# Patient Record
Sex: Male | Born: 1945
Health system: Southern US, Community
[De-identification: ages and names within clinical notes are randomized; demographics above are authoritative.]

## PROBLEM LIST (undated history)

## (undated) DIAGNOSIS — B1920 Unspecified viral hepatitis C without hepatic coma: Secondary | ICD-10-CM

## (undated) DIAGNOSIS — I10 Essential (primary) hypertension: Secondary | ICD-10-CM

## (undated) DIAGNOSIS — I6529 Occlusion and stenosis of unspecified carotid artery: Secondary | ICD-10-CM

## (undated) DIAGNOSIS — J189 Pneumonia, unspecified organism: Secondary | ICD-10-CM

## (undated) DIAGNOSIS — E785 Hyperlipidemia, unspecified: Secondary | ICD-10-CM

## (undated) DIAGNOSIS — E876 Hypokalemia: Secondary | ICD-10-CM

## (undated) DIAGNOSIS — M722 Plantar fascial fibromatosis: Secondary | ICD-10-CM

## (undated) DIAGNOSIS — L439 Lichen planus, unspecified: Secondary | ICD-10-CM

## (undated) DIAGNOSIS — J479 Bronchiectasis, uncomplicated: Secondary | ICD-10-CM

## (undated) DIAGNOSIS — I2510 Atherosclerotic heart disease of native coronary artery without angina pectoris: Secondary | ICD-10-CM

## (undated) DIAGNOSIS — I471 Supraventricular tachycardia, unspecified: Secondary | ICD-10-CM

## (undated) DIAGNOSIS — R55 Syncope and collapse: Secondary | ICD-10-CM

## (undated) DIAGNOSIS — K219 Gastro-esophageal reflux disease without esophagitis: Secondary | ICD-10-CM

## (undated) DIAGNOSIS — Z8619 Personal history of other infectious and parasitic diseases: Secondary | ICD-10-CM

## (undated) DIAGNOSIS — I498 Other specified cardiac arrhythmias: Secondary | ICD-10-CM

## (undated) HISTORY — DX: Hyperlipidemia, unspecified: E78.5

## (undated) HISTORY — PX: CHOLECYSTECTOMY: SHX55

## (undated) HISTORY — DX: Occlusion and stenosis of unspecified carotid artery: I65.29

## (undated) HISTORY — PX: CORONARY ANGIOPLASTY WITH STENT PLACEMENT: SHX49

## (undated) HISTORY — DX: Syncope and collapse: R55

## (undated) HISTORY — DX: Plantar fascial fibromatosis: M72.2

## (undated) HISTORY — DX: Supraventricular tachycardia: I47.1

## (undated) HISTORY — DX: Other specified cardiac arrhythmias: I49.8

## (undated) HISTORY — DX: Personal history of other infectious and parasitic diseases: Z86.19

## (undated) HISTORY — PX: OTHER SURGICAL HISTORY: SHX169

## (undated) HISTORY — PX: KNEE ARTHROSCOPY: SUR90

## (undated) HISTORY — DX: Gastro-esophageal reflux disease without esophagitis: K21.9

## (undated) HISTORY — DX: Supraventricular tachycardia, unspecified: I47.10

## (undated) HISTORY — DX: Pneumonia, unspecified organism: J18.9

## (undated) HISTORY — PX: COLONOSCOPY: SHX174

## (undated) HISTORY — PX: ESOPHAGOGASTRODUODENOSCOPY: SHX1529

## (undated) HISTORY — PX: CATARACT EXTRACTION: SUR2

## (undated) HISTORY — DX: Lichen planus, unspecified: L43.9

## (undated) HISTORY — DX: Hypokalemia: E87.6

## (undated) HISTORY — DX: Atherosclerotic heart disease of native coronary artery without angina pectoris: I25.10

---

## 1999-09-23 ENCOUNTER — Encounter: Payer: Self-pay | Admitting: Gastroenterology

## 1999-09-23 ENCOUNTER — Ambulatory Visit (HOSPITAL_COMMUNITY): Admission: RE | Admit: 1999-09-23 | Discharge: 1999-09-23 | Payer: Self-pay | Admitting: Gastroenterology

## 1999-12-13 ENCOUNTER — Encounter: Payer: Self-pay | Admitting: General Surgery

## 1999-12-13 ENCOUNTER — Encounter (INDEPENDENT_AMBULATORY_CARE_PROVIDER_SITE_OTHER): Payer: Self-pay | Admitting: Specialist

## 1999-12-13 ENCOUNTER — Ambulatory Visit (HOSPITAL_COMMUNITY): Admission: RE | Admit: 1999-12-13 | Discharge: 1999-12-14 | Payer: Self-pay | Admitting: General Surgery

## 2000-11-10 ENCOUNTER — Ambulatory Visit (HOSPITAL_COMMUNITY): Admission: RE | Admit: 2000-11-10 | Discharge: 2000-11-10 | Payer: Self-pay | Admitting: Gastroenterology

## 2001-01-29 ENCOUNTER — Ambulatory Visit (HOSPITAL_COMMUNITY): Admission: RE | Admit: 2001-01-29 | Discharge: 2001-01-29 | Payer: Self-pay | Admitting: Gastroenterology

## 2001-01-29 ENCOUNTER — Encounter (INDEPENDENT_AMBULATORY_CARE_PROVIDER_SITE_OTHER): Payer: Self-pay | Admitting: Specialist

## 2001-05-04 ENCOUNTER — Ambulatory Visit (HOSPITAL_COMMUNITY): Admission: RE | Admit: 2001-05-04 | Discharge: 2001-05-04 | Payer: Self-pay | Admitting: Gastroenterology

## 2001-05-04 ENCOUNTER — Encounter: Payer: Self-pay | Admitting: Gastroenterology

## 2002-08-12 ENCOUNTER — Ambulatory Visit (HOSPITAL_BASED_OUTPATIENT_CLINIC_OR_DEPARTMENT_OTHER): Admission: RE | Admit: 2002-08-12 | Discharge: 2002-08-12 | Payer: Self-pay | Admitting: Urology

## 2002-08-12 ENCOUNTER — Encounter (INDEPENDENT_AMBULATORY_CARE_PROVIDER_SITE_OTHER): Payer: Self-pay | Admitting: *Deleted

## 2003-05-10 ENCOUNTER — Ambulatory Visit (HOSPITAL_COMMUNITY): Admission: RE | Admit: 2003-05-10 | Discharge: 2003-05-10 | Payer: Self-pay | Admitting: Orthopedic Surgery

## 2003-05-10 ENCOUNTER — Ambulatory Visit (HOSPITAL_BASED_OUTPATIENT_CLINIC_OR_DEPARTMENT_OTHER): Admission: RE | Admit: 2003-05-10 | Discharge: 2003-05-10 | Payer: Self-pay | Admitting: Orthopedic Surgery

## 2004-01-16 ENCOUNTER — Ambulatory Visit: Payer: Self-pay | Admitting: Gastroenterology

## 2004-01-29 ENCOUNTER — Ambulatory Visit (HOSPITAL_COMMUNITY): Admission: RE | Admit: 2004-01-29 | Discharge: 2004-01-29 | Payer: Self-pay | Admitting: Gastroenterology

## 2004-09-10 ENCOUNTER — Ambulatory Visit: Payer: Self-pay | Admitting: Gastroenterology

## 2005-01-27 ENCOUNTER — Encounter: Admission: RE | Admit: 2005-01-27 | Discharge: 2005-01-27 | Payer: Self-pay | Admitting: Family Medicine

## 2005-01-30 ENCOUNTER — Ambulatory Visit: Payer: Self-pay | Admitting: Gastroenterology

## 2005-02-06 ENCOUNTER — Ambulatory Visit: Payer: Self-pay | Admitting: Gastroenterology

## 2005-02-27 ENCOUNTER — Ambulatory Visit: Payer: Self-pay | Admitting: Gastroenterology

## 2005-03-06 ENCOUNTER — Ambulatory Visit: Payer: Self-pay | Admitting: Gastroenterology

## 2005-03-20 ENCOUNTER — Ambulatory Visit: Payer: Self-pay | Admitting: Gastroenterology

## 2005-04-03 ENCOUNTER — Ambulatory Visit: Payer: Self-pay | Admitting: Gastroenterology

## 2005-04-17 ENCOUNTER — Ambulatory Visit: Payer: Self-pay | Admitting: Gastroenterology

## 2005-04-21 ENCOUNTER — Ambulatory Visit (HOSPITAL_COMMUNITY): Admission: RE | Admit: 2005-04-21 | Discharge: 2005-04-21 | Payer: Self-pay | Admitting: Gastroenterology

## 2005-05-15 ENCOUNTER — Ambulatory Visit: Payer: Self-pay | Admitting: Gastroenterology

## 2005-06-12 ENCOUNTER — Ambulatory Visit: Payer: Self-pay | Admitting: Gastroenterology

## 2005-06-13 ENCOUNTER — Encounter: Admission: RE | Admit: 2005-06-13 | Discharge: 2005-06-13 | Payer: Self-pay | Admitting: Family Medicine

## 2005-07-10 ENCOUNTER — Ambulatory Visit: Payer: Self-pay | Admitting: Gastroenterology

## 2005-07-28 ENCOUNTER — Ambulatory Visit: Payer: Self-pay | Admitting: Emergency Medicine

## 2005-07-31 ENCOUNTER — Ambulatory Visit: Admission: RE | Admit: 2005-07-31 | Discharge: 2005-07-31 | Payer: Self-pay | Admitting: Emergency Medicine

## 2005-07-31 ENCOUNTER — Ambulatory Visit: Payer: Self-pay | Admitting: Internal Medicine

## 2005-08-07 ENCOUNTER — Ambulatory Visit: Payer: Self-pay | Admitting: Gastroenterology

## 2005-08-18 ENCOUNTER — Ambulatory Visit: Payer: Self-pay | Admitting: Emergency Medicine

## 2005-11-27 ENCOUNTER — Ambulatory Visit: Payer: Self-pay | Admitting: Gastroenterology

## 2006-02-13 ENCOUNTER — Ambulatory Visit: Payer: Self-pay | Admitting: Cardiovascular Disease

## 2006-06-25 ENCOUNTER — Emergency Department (HOSPITAL_COMMUNITY): Admission: EM | Admit: 2006-06-25 | Discharge: 2006-06-25 | Payer: Self-pay | Admitting: Emergency Medicine

## 2006-12-28 ENCOUNTER — Ambulatory Visit (HOSPITAL_BASED_OUTPATIENT_CLINIC_OR_DEPARTMENT_OTHER): Admission: RE | Admit: 2006-12-28 | Discharge: 2006-12-28 | Payer: Self-pay | Admitting: Emergency Medicine

## 2007-01-03 ENCOUNTER — Ambulatory Visit: Payer: Self-pay | Admitting: Internal Medicine

## 2007-04-20 ENCOUNTER — Ambulatory Visit: Payer: Self-pay | Admitting: Gastroenterology

## 2007-10-21 ENCOUNTER — Encounter: Admission: RE | Admit: 2007-10-21 | Discharge: 2007-10-21 | Payer: Self-pay | Admitting: Gastroenterology

## 2007-12-28 ENCOUNTER — Ambulatory Visit: Payer: Self-pay | Admitting: Gastroenterology

## 2008-05-02 ENCOUNTER — Encounter: Admission: RE | Admit: 2008-05-02 | Discharge: 2008-05-02 | Payer: Self-pay | Admitting: Family Medicine

## 2008-06-23 ENCOUNTER — Ambulatory Visit: Payer: Self-pay | Admitting: Internal Medicine

## 2008-06-23 ENCOUNTER — Inpatient Hospital Stay (HOSPITAL_COMMUNITY): Admission: AD | Admit: 2008-06-23 | Discharge: 2008-06-25 | Payer: Self-pay | Admitting: Internal Medicine

## 2008-06-23 ENCOUNTER — Encounter: Payer: Self-pay | Admitting: Emergency Medicine

## 2008-06-23 ENCOUNTER — Ambulatory Visit: Payer: Self-pay | Admitting: Diagnostic Radiology

## 2008-06-30 DIAGNOSIS — I10 Essential (primary) hypertension: Secondary | ICD-10-CM | POA: Insufficient documentation

## 2008-06-30 DIAGNOSIS — I498 Other specified cardiac arrhythmias: Secondary | ICD-10-CM | POA: Insufficient documentation

## 2008-06-30 DIAGNOSIS — I2510 Atherosclerotic heart disease of native coronary artery without angina pectoris: Secondary | ICD-10-CM | POA: Insufficient documentation

## 2008-06-30 DIAGNOSIS — M722 Plantar fascial fibromatosis: Secondary | ICD-10-CM | POA: Insufficient documentation

## 2008-06-30 DIAGNOSIS — E785 Hyperlipidemia, unspecified: Secondary | ICD-10-CM | POA: Insufficient documentation

## 2008-06-30 DIAGNOSIS — I471 Supraventricular tachycardia, unspecified: Secondary | ICD-10-CM | POA: Insufficient documentation

## 2008-06-30 DIAGNOSIS — E876 Hypokalemia: Secondary | ICD-10-CM | POA: Insufficient documentation

## 2008-06-30 DIAGNOSIS — L439 Lichen planus, unspecified: Secondary | ICD-10-CM | POA: Insufficient documentation

## 2008-06-30 DIAGNOSIS — R55 Syncope and collapse: Secondary | ICD-10-CM | POA: Insufficient documentation

## 2008-06-30 DIAGNOSIS — Z8619 Personal history of other infectious and parasitic diseases: Secondary | ICD-10-CM | POA: Insufficient documentation

## 2008-07-05 ENCOUNTER — Telehealth (INDEPENDENT_AMBULATORY_CARE_PROVIDER_SITE_OTHER): Payer: Self-pay | Admitting: *Deleted

## 2008-07-05 ENCOUNTER — Ambulatory Visit: Payer: Self-pay | Admitting: Cardiovascular Disease

## 2008-07-10 ENCOUNTER — Ambulatory Visit: Payer: Self-pay | Admitting: Cardiovascular Disease

## 2008-07-10 LAB — CONVERTED CEMR LAB
BUN: 16 mg/dL (ref 6–23)
CO2: 27 meq/L (ref 19–32)
Calcium: 9.3 mg/dL (ref 8.4–10.5)
Chloride: 108 meq/L (ref 96–112)
Creatinine, Ser: 0.8 mg/dL (ref 0.4–1.5)
GFR calc non Af Amer: 104.41 mL/min (ref >60)
Glucose, Bld: 98 mg/dL (ref 70–99)
Potassium: 3.9 meq/L (ref 3.5–5.1)
Sodium: 139 meq/L (ref 135–145)

## 2008-08-11 ENCOUNTER — Ambulatory Visit: Payer: Self-pay | Admitting: Cardiovascular Disease

## 2008-08-11 LAB — CONVERTED CEMR LAB
ALT: 33 units/L (ref 0–53)
AST: 25 units/L (ref 0–37)
Alkaline Phosphatase: 41 units/L (ref 39–117)
Cholesterol: 94 mg/dL (ref 0–200)
LDL Cholesterol: 54 mg/dL (ref 0–99)
Total Protein: 6.2 g/dL (ref 6.0–8.3)
Triglycerides: 62 mg/dL (ref 0.0–149.0)

## 2008-08-14 ENCOUNTER — Encounter: Payer: Self-pay | Admitting: Cardiovascular Disease

## 2008-10-12 ENCOUNTER — Ambulatory Visit (HOSPITAL_COMMUNITY): Admission: RE | Admit: 2008-10-12 | Discharge: 2008-10-12 | Payer: Self-pay | Admitting: Gastroenterology

## 2008-10-17 ENCOUNTER — Telehealth: Payer: Self-pay | Admitting: Cardiovascular Disease

## 2008-10-17 ENCOUNTER — Encounter: Payer: Self-pay | Admitting: Cardiovascular Disease

## 2008-10-20 ENCOUNTER — Telehealth: Payer: Self-pay | Admitting: Cardiovascular Disease

## 2009-01-17 ENCOUNTER — Ambulatory Visit: Payer: Self-pay | Admitting: Cardiovascular Disease

## 2009-01-24 ENCOUNTER — Telehealth: Payer: Self-pay | Admitting: Cardiovascular Disease

## 2009-03-02 ENCOUNTER — Telehealth: Payer: Self-pay | Admitting: Cardiovascular Disease

## 2009-06-19 ENCOUNTER — Telehealth (INDEPENDENT_AMBULATORY_CARE_PROVIDER_SITE_OTHER): Payer: Self-pay | Admitting: *Deleted

## 2009-07-24 ENCOUNTER — Ambulatory Visit: Payer: Self-pay | Admitting: Cardiovascular Disease

## 2009-07-24 DIAGNOSIS — I2510 Atherosclerotic heart disease of native coronary artery without angina pectoris: Secondary | ICD-10-CM | POA: Insufficient documentation

## 2009-07-26 ENCOUNTER — Encounter: Payer: Self-pay | Admitting: Cardiovascular Disease

## 2009-08-09 ENCOUNTER — Encounter: Admission: RE | Admit: 2009-08-09 | Discharge: 2009-08-09 | Payer: Self-pay | Admitting: Gastroenterology

## 2010-03-24 LAB — CONVERTED CEMR LAB
Albumin: 4.2 g/dL (ref 3.5–5.2)
Alkaline Phosphatase: 42 units/L (ref 39–117)
Bilirubin, Direct: 0 mg/dL (ref 0.0–0.3)
Total Bilirubin: 1.1 mg/dL (ref 0.3–1.2)
Total Protein: 7 g/dL (ref 6.0–8.3)

## 2010-03-26 NOTE — Progress Notes (Signed)
Summary: REFILL  Phone Note Refill Request Message from:  Patient on June 19, 2009 11:41 AM  Refills Requested: Medication #1:  CRESTOR 40 MG TABS 1 tab once daily  Medication #2:  PLAVIX 75 MG TABS 1 tab once daily SEND TO [facility]  Initial call taken by: Judie Grieve,  June 19, 2009 11:42 AM  Follow-up for Phone Call        Rx faxed to pharmacy Follow-up by: Oswald Hillock,  June 19, 2009 1:45 PM    Prescriptions: CRESTOR 40 MG TABS (ROSUVASTATIN CALCIUM) 1 tab once daily  #90 x 3   Entered by:   Oswald Hillock   Authorized by:   Verne Carrow, MD   Signed by:   Oswald Hillock on 06/19/2009   Method used:   Faxed to ...       [facility]* (mail-order)             ,          Ph: [phone]       Fax: [phone]   RxID:   [id number] PLAVIX 75 MG TABS (CLOPIDOGREL BISULFATE) 1 tab once daily  #90 x 3   Entered by:   Oswald Hillock   Authorized by:   Verne Carrow, MD   Signed by:   Oswald Hillock on 06/19/2009   Method used:   Faxed to ...       [facility]* (mail-order)             ,          Ph: [phone]       Fax: [phone]   RxID:   [id number]

## 2010-03-26 NOTE — Assessment & Plan Note (Signed)
Summary: 6 month rov/sl   Visit Type:  6 mo f/u Primary Provider:  Dr. [name]  CC:  no cardiac complaints today.  History of Present Illness: 65 yo WM with history of CAD s/p placement Xience DES mid LAD 06/23/08,  HTN, hyperlipidemia, former tobacco abuse and hepatitis C here for scheduled follow up.  He denies any chest pain, SOB, DOE, palpitations, near syncope, syncope, dizziness, orthopnea or PND, LE edema. He has been working full time without problems. He has been taking all of his medications as prescribed.  His recent labwork is reviewed from 06/21/09. AST 99 and ALT 186. Total chol: 95. HDL: 28 LDL:56 TG: 53.  He has known hepatitis C and has abnormal LFTs in the past. We have discussed statin therapy in the past and started the Crestor last fall when his LFTs were normal. Now that they are abnormal, he will need to discuss with his GI doctor.  He is getting in to see GI soon to discuss colonoscopy.   Current Medications (verified): 1)  Diovan 160 Mg Tabs (Valsartan) .Marland Kitchen.. 1 Tab Once Daily 2)  Budeprion Sr 150 Mg Xr12h-Tab (Bupropion Hcl) .Marland Kitchen.. 1 Tab Once Daily 3)  Fish Oil 1000 Mg Caps (Omega-3 Fatty Acids) .... 2 Caps Once Daily 4)  Calcium 600/vitamin D 600-400 Mg-Unit Tabs (Calcium Carbonate-Vitamin D) .... Take 1 Tablet By Mouth Two Times A Day 5)  Vitamin D3 2000 Unit Caps (Cholecalciferol) .Marland Kitchen.. 1cap Once Daily 6)  Vitamin B-12 250 Mcg Tabs (Cyanocobalamin) .Marland Kitchen.. 1 Tab Once Daily 7)  Vitamin C 500 Mg Tabs (Ascorbic Acid) .Marland Kitchen.. 1 Tab Once Daily 8)  Plavix 75 Mg Tabs (Clopidogrel Bisulfate) .Marland Kitchen.. 1 Tab Once Daily 9)  Crestor 40 Mg Tabs (Rosuvastatin Calcium) .Marland Kitchen.. 1 Tab Once Daily 10)  Cialis 20 Mg Tabs (Tadalafil) .... Use As Directed As Needed 11)  Tylenol Pm Extra Strength 500-25 Mg Tabs (Diphenhydramine-Apap (Sleep)) .... Use As Directed As Needed 12)  Aspirin 81 Mg Tbec (Aspirin) .... Take One Tablet By Mouth Daily 13)  Ranitidine Hcl 150 Mg Tabs (Ranitidine Hcl) .... As  Needed 14)  Zinc 50 Mg Tabs (Zinc) .... Take 1 Tablet By Mouth Once A Day  Allergies (verified): No Known Drug Allergies  Past History:  Past Medical History: CAD (ICD-414.00)(status post cardiac cath with percutaneous coronary intervention using XIENCE drug-eluting stent to mid left anterior descending 06/23/08).   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 Full Time, RN. Married  Tobacco Use - Former...greater than 40 pack per year... quit 1989 Alcohol Use - yes.quit 25 yrs ago Drug Use - yes..quit 25 yrs ago  Review of Systems  The patient denies fatigue, malaise, fever, weight gain/loss, vision loss, decreased hearing, hoarseness, chest pain, palpitations, shortness of breath, prolonged cough, wheezing, sleep apnea, coughing up blood, abdominal pain, blood in stool, nausea, vomiting, diarrhea, heartburn, incontinence, blood in urine, muscle weakness, joint pain, leg swelling, rash, skin lesions, headache, fainting, dizziness, depression, anxiety, enlarged lymph nodes, easy bruising or bleeding, and environmental allergies.    Vital Signs:  Patient profile:   65 year old male Height:      70 inches Weight:      204 pounds BMI:     29.38 Pulse rate:   68 / minute Pulse rhythm:  regular BP sitting:   114 / 70  (left arm) Cuff size:   large  Vitals Entered By: Danielle Rankin, CMA (Jul 24, 2009 10:37 AM)  Physical Exam  General:  General: Well developed, well nourished, NAD HEENT: OP clear, mucus membranes moist Psychiatric: Mood and affect normal Neck: No JVD, no carotid bruits, no thyromegaly, no lymphadenopathy. Lungs:Clear bilaterally, no  wheezes, rhonci, crackles CV: RRR no murmurs, gallops rubs Abdomen: soft, NT, ND, BS present Extremities: No edema, pulses 2+.    Impression & Recommendations:  Problem # 1:  CAD, NATIVE VESSEL (ICD-414.01) Stable s/p drug eluting stent mid LAD 13 months ago. Will stop Plavix since he has had one year of dual antiplatelet therapy in setting of uncomplicated PCI of mid LAD. Will increase ASA to 325 mg once daily. Continue statin for now. He will discuss recent abnormal LFTs with his GI doctor. We may need to cut his Crestor down to 10 mg per day and then see how his LFTs respond. Difficult so say if his LFTs are abnormal because of the statin or the underlying hepatitis. Will refill omeprazole.   The following medications were removed from the medication list:    Plavix 75 Mg Tabs (Clopidogrel bisulfate) .Marland Kitchen... 1 tab once daily His updated medication list for this problem includes:    Aspirin Ec 325 Mg Tbec (Aspirin) .Marland Kitchen... Take one tablet by mouth daily  Problem # 2:  HYPERLIPIDEMIA-MIXED (ICD-272.4) See above. Recent fasting lipids in HPI.   His updated medication list for this problem includes:    Crestor 40 Mg Tabs (Rosuvastatin calcium) .Marland Kitchen... 1 tab once daily  Patient Instructions: 1)  Your physician recommends that you schedule a follow-up appointment in: 6 months 2)  Your physician has recommended you make the following change in your medication: Stop Plavix. 3)  Increase coated aspirin to 325 mg by mouth daily Prescriptions: OMEPRAZOLE 20 MG CPDR (OMEPRAZOLE) take one tablet daily  #90 x 3   Entered by:   Dossie Arbour, RN, BSN   Authorized by:   Verne Carrow, MD   Signed by:   Dossie Arbour, RN, BSN on 07/24/2009   Method used:   Electronically to        [facility]* (mail-order)             ,          Ph: [phone]       Fax: [phone]   RxID:   [id number]

## 2010-03-26 NOTE — Progress Notes (Signed)
Summary: off plavix 5 days prior  Phone Note From Other Clinic   Caller: [facility] [phone] Request: Talk with Nurse Details for Reason: pt having surgery on 2/10. need to come off plavix 5 day prior. pls fax info [phone].  Initial call taken by: Lorne Skeens,  March 02, 2009 11:32 AM  Follow-up for Phone Call        Spoke with Rene Kocher at [facility] medical. Pt needs to be off Plavix for 5 days prior to endoscopy and colonoscopy which is planned for 2/10. Procedure is a routine follow up.  Will forward information to Dr. [name]. Follow-up by: Dossie Arbour, RN, BSN,  March 02, 2009 1:45 PM  Additional Follow-up for Phone Call Additional follow up Details #1::        Can we please wait until Summer to perform the colonscopy as it is routine. He needs one year of Plavix before it is held per current guidelines. The risk  of stent thrombosis is elevated within the first year after drug eluting stent placement. cdm Additional Follow-up by: Verne Carrow, MD,  March 05, 2009 11:20 AM    Additional Follow-up for Phone Call Additional follow up Details #2::    Rene Kocher at [facility] notified of Dr. Gibson Ramp recommendation as above Follow-up by: Dossie Arbour, RN, BSN,  March 05, 2009 11:37 AM

## 2010-06-01 LAB — CREATININE, SERUM: GFR calc non Af Amer: >60 mL/min (ref >60)

## 2010-06-04 LAB — BASIC METABOLIC PANEL
CO2: 26 mEq/L (ref 19–32)
Chloride: 107 mEq/L (ref 96–112)
Chloride: 109 mEq/L (ref 96–112)
Creatinine, Ser: 0.77 mg/dL (ref 0.4–1.5)
GFR calc Af Amer: >60 mL/min (ref >60)
GFR calc Af Amer: >60 mL/min (ref >60)
GFR calc non Af Amer: >60 mL/min (ref >60)
Glucose, Bld: 104 mg/dL — ABNORMAL HIGH (ref 70–99)
Glucose, Bld: 119 mg/dL — ABNORMAL HIGH (ref 70–99)
Potassium: 4 mEq/L (ref 3.5–5.1)
Sodium: 135 mEq/L (ref 135–145)

## 2010-06-04 LAB — CBC
HCT: 34.1 % — ABNORMAL LOW (ref 39.0–52.0)
HCT: 35 % — ABNORMAL LOW (ref 39.0–52.0)
Hemoglobin: 12.3 g/dL — ABNORMAL LOW (ref 13.0–17.0)
MCHC: 35.2 g/dL (ref 30.0–36.0)
MCV: 98.5 fL (ref 78.0–100.0)
MCV: 99.1 fL (ref 78.0–100.0)
Platelets: 187 10*3/uL (ref 150–400)

## 2010-06-04 LAB — HEPATIC FUNCTION PANEL
ALT: 30 U/L (ref 0–53)
AST: 21 U/L (ref 0–37)
Albumin: 3 g/dL — ABNORMAL LOW (ref 3.5–5.2)
Indirect Bilirubin: 0.7 mg/dL (ref 0.3–0.9)

## 2010-06-05 LAB — BASIC METABOLIC PANEL
Chloride: 109 mEq/L (ref 96–112)
GFR calc Af Amer: >60 mL/min (ref >60)
GFR calc non Af Amer: >60 mL/min (ref >60)

## 2010-06-05 LAB — URINALYSIS, ROUTINE W REFLEX MICROSCOPIC
Bilirubin Urine: NEGATIVE
Glucose, UA: NEGATIVE mg/dL
Hgb urine dipstick: NEGATIVE
Nitrite: NEGATIVE
Specific Gravity, Urine: 1.02 (ref 1.005–1.030)
pH: 6 (ref 5.0–8.0)

## 2010-06-05 LAB — COMPREHENSIVE METABOLIC PANEL
ALT: 40 U/L (ref 0–53)
AST: 41 U/L — ABNORMAL HIGH (ref 0–37)
Alkaline Phosphatase: 52 U/L (ref 39–117)
Creatinine, Ser: 0.8 mg/dL (ref 0.4–1.5)
GFR calc Af Amer: >60 mL/min (ref >60)
Potassium: 4.2 mEq/L (ref 3.5–5.1)
Sodium: 138 mEq/L (ref 135–145)

## 2010-06-05 LAB — CBC
HCT: 45.9 % (ref 39.0–52.0)
Hemoglobin: 12.3 g/dL — ABNORMAL LOW (ref 13.0–17.0)
MCHC: 34.8 g/dL (ref 30.0–36.0)
MCV: 99.1 fL (ref 78.0–100.0)
MCV: 99.3 fL (ref 78.0–100.0)
Platelets: 192 10*3/uL (ref 150–400)
Platelets: 274 10*3/uL (ref 150–400)
RBC: 3.58 MIL/uL — ABNORMAL LOW (ref 4.22–5.81)
RDW: 13 % (ref 11.5–15.5)
RDW: 12.2 % (ref 11.5–15.5)

## 2010-06-05 LAB — PROTIME-INR
Prothrombin Time: 17.9 seconds — ABNORMAL HIGH (ref 11.6–15.2)
Prothrombin Time: 15 seconds (ref 11.6–15.2)

## 2010-06-05 LAB — DIFFERENTIAL
Basophils Relative: 1 % (ref 0–1)
Monocytes Absolute: 0.5 10*3/uL (ref 0.1–1.0)
Neutrophils Relative %: 73 % (ref 43–77)

## 2010-06-05 LAB — POCT CARDIAC MARKERS
CKMB, poc: 1.2 ng/mL (ref 1.0–8.0)
Myoglobin, poc: 50.8 ng/mL (ref 12–200)
Myoglobin, poc: 53 ng/mL (ref 12–200)

## 2010-07-09 NOTE — Cardiovascular Report (Signed)
NAME:  XXAVIER, NOON NO.:  [id number]   MEDICAL RECORD NO.:  [id number]          PATIENT TYPE:  INP   LOCATION:  2503                         FACILITY:  [facility]   PHYSICIAN:  Bevelyn Buckles. Bensimhon, MDDATE OF BIRTH:  1946-01-15   DATE OF PROCEDURE:  06/23/2008  DATE OF DISCHARGE:                            CARDIAC CATHETERIZATION   PATIENT IDENTIFICATION:  Mr. Lazarz is a 65 year old male who works at  [employer].  He has no history of prior  coronary artery disease.  Over the past day or two, he had been having  crescendo chest pain concerning for unstable angina.  He is brought to  the Catheterization Lab for diagnostic angiography.   PROCEDURES PERFORMED:  1. Selective coronary angiography.  2. Left heart catheterization.  3. Left ventriculogram.   DESCRIPTION OF PROCEDURE:  The risks and indication were explained.  Consent was signed and placed on the chart.  A 5-French arterial sheath  was placed in the right femoral artery using a modified Seldinger  technique.  Standard catheters including a JL-4, JR-4 and angled pigtail  were used for procedure.  All catheter changes made over wire.  There  are no apparent complications.   HEMODYNAMICS:  Central aortic pressure was 95/59 with a mean of 75.  LV  pressure 97/7 with an EDP of 15.  There was no aortic stenosis.   Left main had a distal 30% stenosis which was contiguous with the ostial  LAD.   LAD was a long vessel wrapping the apex.  It gave off two diagonals.  There was 30% lesion in the ostial LAD, a tubular 40% throughout the  proximal to mid LAD, right after the takeoff of the second diagonal  there was a 90-95% focal stenosis and more distally in the LAD there was  a 30% stenosis.  In the ostium of the small first diagonal, there  appeared to be a 95-99% stenosis and in  the ostium of the second  diagonal there was a 30-40% stenosis.  This was a fairly large diagonal.   Left circumflex gave off a small heavily diseased ramus and two marginal  branches.  There is an ostial 40% lesion in the circumflex and a mid 40-  50% lesion in the AV groove circumflex.   Right coronary artery was a dominant vessel gave off a PDA and a small  posterolateral.  There was a 30% tubular lesion proximally.   Left ventriculogram done in the RAO position showed an EF of 65-70% with  no regional wall motion abnormalities.  On panning down over the aorta,  there appeared to be a very small distal abdominal aortic aneurysm.   ASSESSMENT:  1. Coronary artery disease with a high-grade mid-left anterior      descending lesion as described above and nonobstructive disease      elsewhere.  2. Normal left ventricular function.  3. Probable small distal abdominal aortic aneurysm.   PLAN:  Percutaneous intervention on the LAD by Dr. [name].  At some  point, he will need a followup CT scan of the  abdomen to evaluate his  possible abdominal aortic aneurysm.  He will also need aggressive risk  factor modification.      Bevelyn Buckles. Bensimhon, MD  Electronically Signed     DRB/MEDQ  D:  06/23/2008  T:  06/24/2008  Job:  [id number]

## 2010-07-09 NOTE — H&P (Signed)
NAME:  Tommy Santiago, Tommy Santiago NO.:  [id number]   MEDICAL RECORD NO.:  [id number]          PATIENT TYPE:  INP   LOCATION:  2503                         FACILITY:  [facility]   PHYSICIAN:  Pricilla Riffle, MD, FACCDATE OF BIRTH:  [birth year]   DATE OF ADMISSION:  06/23/2008  DATE OF DISCHARGE:                              HISTORY & PHYSICAL   PRIMARY CARE PHYSICIAN:  Tommy Harries, MD at [facility].   PRIMARY CARDIOLOGIST:  Tommy Critchley. Shelva Majestic, MD   CHIEF COMPLAINT:  Chest pain.   HISTORY OF PRESENT ILLNESS:  Tommy Santiago is a 65 year old male with no  previous history of coronary artery disease.  He has been monitored by  Dr. Shelva Santiago and had an echocardiogram this January as well as an  exercise treadmill test approximately 2 years ago that he says was okay.  He had onset of substernal chest pain yesterday, noticing it in the  morning.  It was only 1 or 2/10.  It was episodic throughout the day.  Each episode would last about a minute and he had multiple episodes, 1  about every 15-30 minutes.  They worsened in intensity over the day.  In  the evening, he tried Pepto-Bismol and he is unsure if it gave him any  relief.  There was no association with exertion as he was able to walk  significant distances without noticing any change in his pain.  There  was also no change with deep inspiration or cough.   Today, the symptoms worsened and reached to 10/10.  He went to the [facility]  He had some associated nausea today,  which was new.  He was given sublingual nitroglycerin which helped his  pain, but his blood pressure dropped to 59/36 and his heart rate was  sinus bradycardia in the 40s.  He was placed in Trendelenburg and given  IV fluids.  His blood pressure improved, but later, his pain came back.  At that time, he was given morphine which helped.  He continued to  experience pain, however, and eventually IV nitroglycerin was  started at  a low dose and titrated with careful attention to his blood pressure.  This did drop his pain down and currently, he is at a 1 or 2/10.  He is  not having any associated symptoms at this time.  Before yesterday, he  had never had this pain before.   PAST MEDICAL HISTORY:  1. Echocardiogram in January 2010, okay per the patient's report.  2. Exercise treadmill test approximately 2 years ago, okay by the      patient's report.  3. Hypertension.  4. Hyperlipidemia.  5. Family history of coronary artery disease.  6. Remote history of tobacco use.  7. History of vasovagal syncope.  8. History of hepatitis C.  9. History of lichen planus of the lip.  10.Plantar fasciitis.   SURGICAL HISTORY:  He is status post laparoscopic cholecystectomy as  well as EGD and colonoscopy and knee surgery.   ALLERGIES:  No known drug allergies.   CURRENT  MEDICATIONS:  1. Hydrochlorothiazide 25 mg a day.  2. Diovan 160 mg a day.  3. Bupropion 150 mg a day.  4. Fish oil daily.  5. Calcium daily.  6. Vitamin D, A, C, and B12 daily.  7. Cialis p.r.n.  8. Omeprazole 20 mg a day.  9. Aspirin 81 mg a day.   SOCIAL HISTORY:  He lives in [place] in [place] with his wife.  He  works as an Charity fundraiser at [employer].  He has a greater than 40-pack-year history of  tobacco use, but quit back in 1989.  He also has a remote history of  alcohol and drug use, but quit both of those greater than 25 years ago.   FAMILY HISTORY:  His mother died at age 32 in a motor vehicle accident.  His father is alive at age 71, but had a bypass surgery at least 12  years ago.  He has 4 siblings and 1 brother has a history of coronary  artery disease diagnosed in his early 39s.   REVIEW OF SYSTEMS:  He has been exercising more and watching his diet  and his weight down 21 pounds.  He had a recent sore throat and some  sinus drainage, which he treated with over-the-counter medications.  He  had palpitations associated with  a heart rate of approximately 200  greater than 25 years ago, but spontaneously converted while under  medical care and has not had symptoms since.  He has an occasional dry  cough.  He has occasional anxiety when not on the bupropion.  This week,  he became lightheaded and weak while working out and his blood pressure  was noted to be low at that time, but no mention is made of an abnormal  heart rate.  His reflux symptoms are well treated with the omeprazole  and a full 14-point review of systems is otherwise negative.   PHYSICAL EXAMINATION:  VITAL SIGNS:  Temperature is 97.6, blood pressure  115/69, heart rate 62, respiratory rate 14, and O2 saturation 99% on  room air.  Of note, after receiving sublingual nitroglycerin x1, his  blood pressure dropped to 59/36 and his heart rate was 45 beats per  minute.  GENERAL:  He is a well-developed, well-nourished white male in mild  distress.  HEENT:  Normal.  NECK:  There is no lymphadenopathy, thyromegaly, bruits, or JVD noted.  CV:  His heart is regular in rate and rhythm with an S1 and S2 and no  significant murmur, rub, or gallop is noted.  Distal pulses are intact  in all 4 extremities.  LUNGS:  He has occasional crackles at the base, which almost cleared  with cough.  SKIN:  He has multiple tattoos, which are all well healed.  ABDOMEN:  Soft and he has mild diffuse tenderness with no masses.  EXTREMITIES:  There is no cyanosis, clubbing, or edema noted.  MUSCULOSKELETAL:  There is no joint deformity or effusions.  NEURO:  He is alert and oriented with cranial nerves II through XII  grossly intact.   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   EKG:  Sinus bradycardia, rate 58 with PVCs, but no acute ischemic  changes.   LABORATORY VALUES:  Hemoglobin 15.4, hematocrit 45.9, WBC 7.1, and  platelets 274.  Sodium 138, potassium 4.2, chloride 104, CO2 25, BUN 10,  creatinine 0.8, and glucose 103.  INR 1.1 and PTT 32.   Urinalysis  negative.  Point-of-care markers negative  x2.   IMPRESSION:  Tommy Santiago is a 65 year old male with multiple cardiac  risk factors.  He yesterday began having intermittent chest pain.  Today, his symptoms worsened and he went to the emergency room.  Reflux  medications did not help and it is not pleuritic or positional.  His  symptoms are therefore worrisome for unstable and possibly crescendo  angina.  The workup was discussed with the patient and his wife.  Cardiac catheterization is favored to definitively define his anatomy.  The risks and benefits were discussed with the patient and his wife and  they agreed to proceed.  He will be continued on IV nitroglycerin with  fluid resuscitation as it is going on now.  We will add heparin to his  medication regimen and he received aspirin 81 mg x4 at the Urgent Care  Center.  His blood pressure will be followed on the ARB and we will hold  the hydrochlorothiazide for cath.  We will check LFTs.  Of note, Mr.  Hayward had a lipid profile performed on June 13, 2008, which showed a  total cholesterol of 174, triglycerides of 108, HDL 26, and LDL 126.7.  Further evaluation and treatment will depend on the results of the above  testing.   Dr. Dietrich Pates saw the patient and determined the plan of care.      Theodore Demark, PA-C      Pricilla Riffle, MD, [facility]  Electronically Signed    RB/MEDQ  D:  06/23/2008  T:  06/24/2008  Job:  [id number]

## 2010-07-09 NOTE — Discharge Summary (Signed)
NAME:  Tommy Santiago, Tommy Santiago NO.:  [id number]   MEDICAL RECORD NO.:  [id number]          PATIENT TYPE:  INP   LOCATION:  2014                         FACILITY:  [facility]   PHYSICIAN:  Rollene Rotunda, MD, FACCDATE OF BIRTH:  10-01-1945   DATE OF ADMISSION:  06/23/2008  DATE OF DISCHARGE:  06/25/2008                               DISCHARGE SUMMARY   PRIMARY CARE PHYSICIAN:  [name], MD   PRIMARY CARDIOLOGIST:  Macarthur Critchley. Shelva Majestic, MD   INTERVENTIONAL CARDIOLOGIST:  Verne Carrow, MD   DISCHARGE DIAGNOSES:  1. Coronary artery disease (status post cardiac cath with percutaneous      coronary intervention using XIENCE drug-eluding stent to mid left      anterior descending).  2. Hypokalemia.  3. Bradycardia.   SECONDARY DIAGNOSES:  1. History of paroxysmal supraventricular tachycardia.  2. Hypertension.  3. Hyperlipidemia.  4. Remote history of tobacco abuse.  5. History of vasovagal syncope.  6. History of hepatitis C.  7. History of lichen planus of the lip.  8. Planar fasciitis.  9. Status post cholecystectomy.  10.Status post EGD.  11.Status post colonoscopy.  12.Status post knee surgery.   ALLERGIES:  NKDA.   PROCEDURES PERFORMED DURING THIS HOSPITALIZATION:  1. EKG performed on June 23, 2008, showing sinus bradycardia with      rate of 58 and PVCs but no acute ST-T-wave changes and no      significant Q-waves.  2. 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  3. Cardiac catheterization complete on June 23, 2008 showing:      a.     CAD with high-grade mid-left anterior descending lesion and       nonobstructive disease elsewhere.      b.     Normal LV function.      c.     Probable small distal abdominal aortic aneurysm      d.     Successful PCI using XIENCE DES to mid LAD.  4. EKG completed on Jun 24, 2008, showing sinus bradycardia, rate of 58      beats per minute, no significant changes from prior  tracing.   HISTORY OF PRESENT ILLNESS:  Tommy Santiago is a 65 year old male with no  obvious history of CAD.  He has been monitored by Dr. Shelva Majestic and had an  echocardiogram this January as well as an exercise treadmill test  approximately 2 years ago that he reports as being okay.  He had onset  of substernal chest pain on June 22, 2008.  The pain began in the  morning, it was approximately 1 or 2/10, it was episodic throughout the  day.  Each episode would last about a minute, and he had multiple  episodes one about every 15-30 minutes.  They worsened in intensity over  the day.  In the evening, he tried Pepto-Bismol and was not certain of  any relief.  There was no association with exertion as he was able to  walk significant distances without noticing any change in his  pain.  There was also no change with deep inspiration or cough.  On June 23, 2008, symptoms worsened and reached 10/10.  He went to [facility] in [facility].  He had some associated nausea, which was new.  He  was given sublingual nitro which helped his pain, but his BP dropped to  59/36 and his heart rate was in the 40s (sinus bradycardia).  He was  placed in Trendelenburg and given IV fluids.  His BP improved but later  his pain came back.  At that time, he was given morphine which helped.  He continued to experience pain, and eventually IV nitroglycerin was  started at a low dose and titrated with careful attention to BP.  This  did drop his pain down.  At time of eval at [facility], pain 1or 2/10.  No  associated symptoms.  Prior to June 22, 2008, the patient had never  experienced these symptoms.   HOSPITAL COURSE:  The patient admitted and underwent procedures as  described above.  He tolerated them well.  He did have some oozing at  the cath site which required compression for 45 minutes.  However, after  this he had no more wheezing.  No significant tenderness at cath site,  only mild ecchymosis.  The  patient had no significant drop in hemoglobin  or hematocrit.  The patient's was enrolled in the ADAPT-DES and PARIS  clinical trials.  He was given a full explanation of the risks and  benefits prior to signing consent form and received a copy of this  consent form prior to any study specific procedures being completed.  The patient had mild hypokalemia of 3.1 on Jun 24, 2008, and potassium  repletion was given.  The patient had normal potassium on Jun 25, 2008,  and no recurrence of cath site evening.  The patient did have brief  episodes lasting between 5 and 15 seconds of supraventricular  tachycardia.  The patient reports having similar episodes in the past  starting in the 1970s.  The patient totally asymptomatic.  At this time,  it is not felt that he needs any changes in medication due to his  paroxysmal SVT.  The patient's primary cardiologist was unable to follow  up with the patient regularly secondary to not having an office at this  time.  The patient is requested to follow up with Dr. Verne Carrow until Dr. Shelva Majestic is at his new location.  The patient will be  discharged on his home medications along with the addition of full-  strength enteric-coated aspirin, Plavix 75 mg, and Crestor 40 mg daily.  The patient will not be started on a beta-blocker secondary to low heart  rates during hospital course and significant bradycardia as mentioned in  HPI.  Vital signs had remained stable over the last several days of  hospital course.   MOST RECENT VITAL SIGNS ON DATE OF DISCHARGE:  Temp 98.6 degrees  Fahrenheit, BP 105/63, pulse 63, respiration rate 18, O2 saturation 93%  on room air.   DISCHARGE LABORATORY DATA:  WBC 4.7, HGB 11.9, HCT 34.1, PLT count 170.  Sodium 138, potassium 4.0, chloride 109, CO2 26, BUN 5, creatinine 0.77,  glucose 104, calcium 8.3.   FOLLOWUP PLANS AND APPOINTMENTS:  1. Basic metabolic panel to be drawn next week.  2. Follow up with Dr.  [name] in 2 weeks [facility] office will call      the patient).  DISCHARGE MEDICATIONS:  1. Hydrochlorothiazide 25 mg p.o. daily.  2. Protonix 40 mg p.o. daily.  3. Diovan 160 mg p.o. daily.  4. Bupropion SR 150 mg p.o. daily.  5. Complete mineral complex daily.  6. Fish oil 2 g p.o. daily.  7. Calcium and D 600 mg daily.  8. Vitamin D3 2000 International Units daily.  9. Vitamin B12 250 mcg daily.  10.Vitamin A 8000 International Units daily.  11.Vitamin C daily.  12.Enteric-coated aspirin 325 p.o. daily.  13.Plavix 75 mg daily.  14.Crestor 40 mg p.o. daily.  15.Cialis 20 mg p.r.n.  16.Tylenol PM p.r.n.   Duration of discharge encounter including physician time was 40 minutes.      Jarrett Ables, [facility]      Rollene Rotunda, MD, [facility]  Electronically Signed    MS/MEDQ  D:  06/25/2008  T:  06/26/2008  Job:  [id number]   cc:   [name], M.D.  Macarthur Critchley Shelva Majestic, M.D.  Verne Carrow, MD  Pricilla Riffle, MD, [facility]

## 2010-07-09 NOTE — Cardiovascular Report (Signed)
NAME:  TARIG, ZIMMERS NO.:  [id number]   MEDICAL RECORD NO.:  [id number]           PATIENT TYPE:   LOCATION:                                 FACILITY:   PHYSICIAN:  Verne Carrow, MDDATE OF BIRTH:  Sep 29, 1945   DATE OF PROCEDURE:  06/23/2008  DATE OF DISCHARGE:                            CARDIAC CATHETERIZATION   REFERRING CARDIOLOGIST:  Pricilla Riffle, MD, [facility]   PRIMARY CARDIOLOGISTS:  Macarthur Critchley. Torelli, MD   PROCEDURE PERFORMED:  Placement of a XIENCE drug-eluting stent in the  mid left anterior descending coronary artery.   OPERATOR:  Verne Carrow, MD   INDICATION:  This is a 65 year old Caucasian male with a history of  hypertension, hyperlipidemia, family history of coronary artery disease,  and remote tobacco abuse who presented to the [facility] today with complaints of chest discomfort.  Left heart  catheterization was performed by Dr. Arvilla Meres earlier this  afternoon.  The patient was found to have a 95% stenosis in the mid left  anterior descending coronary artery.  There was a 30-40% stenosis in the  ostium of the second diagonal branch that arose just in the area of the  tight LAD stenosis.  Dr. Gala Romney consulted me for percutaneous  coronary artery intervention.   DETAILS OF THE PROCEDURE:  The patient had signed informed consent for a  possible percutaneous coronary intervention prior to his diagnostic  catheterization.  Following the diagnostic catheterization, the 5-French  sheath was changed to a 7-French sheath.  The patient was given 600 mg  of Plavix on the cath table.  An Angiomax bolus was given, and a drip  was started.  The patient was also given intravenous Pepcid at this  time.  An XB LAD 3.5, 7-French guiding catheter was used to selectively  engage the left main coronary artery.  Once the ACT was greater than  200, a Cougar intracoronary wire was passed down the proximal LAD  into  the large second diagonal branch.  A BMW wire was then passed down  through the mid LAD into the distal LAD.  Once both wires were in place,  I passed a 2.5 x 15 mm balloon into the area of tight stenosis in the  mid LAD.  This balloon was placed over the wire that extended down into  the distal LAD.  This balloon was inflated to 10 atmospheres.  I then  placed a 2.75 x 18 mm XIENCE drug-eluting stent in the mid LAD across  the diagonal branch.  The wire in the diagonal branch was left in place  during stent deployment.  Once the stent was deployed, there was  excellent flow down both the LAD and the diagonal branch.  I then  elected to pass a 3.0 x 10 mm noncompliant balloon down into the stented  segment.  This was inflated to 16 atmospheres in the proximal portion of  the stent in to 10 atmospheres in the distal portion of the stent.  I  felt that there was excellent stent expansion.  Following the  postdilatation of the stent, there was still excellent flow down the  diagonal branch.  The stenosis in the left anterior descending was  reduced from 95% to 0%.  There was TIMI-3 flow down the length of the  LAD following the intervention.  There was slight plaque shift into the  ostium of the second diagonal branch with stent deployment.  There was  excellent flow down this vessel.  I do not feel that flow was  compromised down the diagonal branch.  I elected not to perform any  angioplasty of the ostium of the diagonal given the risk of vessel  dissection.  The patient tolerated the procedure well and was taken to  the holding area in stable condition.   IMPRESSION:  Successful percutaneous coronary intervention with  placement of a XIENCE drug-eluting stent into the mid left anterior  descending coronary artery.   RECOMMENDATIONS:  The patient should be continued on aspirin and Plavix  for at least 1 year.  I will start Crestor 40 mg once daily now.  We  will not start a  beta-blocker at this time secondary to his bradycardia.  The patient's heart rate was in the 40s during the procedure.      Verne Carrow, MD  Electronically Signed     CM/MEDQ  D:  06/23/2008  T:  06/24/2008  Job:  [id number]   cc:   Macarthur Critchley. Shelva Majestic, M.D.

## 2010-07-09 NOTE — Procedures (Signed)
NAME:  JOSEL, KEO NO.:  [id number]   MEDICAL RECORD NO.:  [id number]          PATIENT TYPE:  OUT   LOCATION:  SLEEP CENTER                 FACILITY:  [facility]   PHYSICIAN:  Clinton D. Maple Hudson, MD, FCCP, FACPDATE OF BIRTH:  26[birth year]1947   DATE OF STUDY:  12/28/2006                            NOCTURNAL POLYSOMNOGRAM   REFERRING PHYSICIAN:  Reuben Likes, M.D.   INDICATION FOR STUDY:  Hypersomnia with sleep apnea.   EPWORTH SLEEPINESS SCORE:  4/24, BMI 27.9, weight 200 pounds, height 5  feet 11 inches, neck 16-1/2 inches.   MEDICATIONS:  Home medications listed and reviewed.   SLEEP ARCHITECTURE:  Total sleep time 292 minutes with sleep efficiency  80%.  Stage I was 5%, stage II 72%, stage III 3%, REM 18% of total sleep  time.  Sleep latency 40 minutes, REM latency 55 minutes, awake after  sleep onset 31 minutes, arousal index 1.2.  Tylenol PM was taken at  bedtime.   RESPIRATORY DATA:  Apnea-hypopnea index (AHI) 2.5 obstructive events per  hour which is within normal limits (normal range 0 to 5).  There were a  total of 12 obstructive events, all hypopneas.  Events were not  positional.  REM AHI 2.2.  There were insufficient events to permit CPAP  titration by split protocol on this study night.   OXYGEN DATA:  Mild snoring and oral venting with oxygen desaturation to  a nadir of 90%.  Mean oxygen saturation through the study was 94.8% on  room air.   CARDIAC DATA:  Normal sinus rhythm.   MOVEMENT-PARASOMNIA:  A total of 16 limb jerks were recorded with  insignificant sleep disturbance.   IMPRESSIONS-RECOMMENDATIONS:  1. Occasional obstructive respiratory events, apnea-hypopnea index 2.5      per hour which is normal (normal range 0 to 5 per hour).  Events      were not positional.  Mild snoring and oral venting with oxygen      desaturation nadir 90      percent.  2. There were insufficient events to qualify for continuous positive      airway pressure  titration.      Clinton D. Maple Hudson, MD, [facility], FACP  Diplomate, Biomedical engineer of Sleep Medicine  Electronically Signed     CDY/MEDQ  D:  01/03/2007 10:37:25  T:  01/03/2007 18:12:35  Job:  [id number]

## 2010-07-12 NOTE — Op Note (Signed)
[place]  Patient:    Tommy Santiago, Tommy Santiago                        MRN: [id number] Proc. Date: 12/13/99 Adm. Date:  [id number] Attending:  Arlis Porta CC:         Redmond Baseman, M.D.  Griffith Citron, M.D.   Operative Report  PREOPERATIVE DIAGNOSES: 1. Cholelithiasis. 2. Hepatitis.  POSTOPERATIVE DIAGNOSES: 1. Chronic calculus cholecystitis. 2. Hepatitis C.  PROCEDURE: 1. Laparoscopic cholecystectomy with intraoperative cholangiogram. 2. True cut needle liver biopsy.  SURGEON:  Avel Peace, M.D.  ASSISTANTSamuella Cota.  ANESTHESIA:  General.  INDICATIONS:  [name] is a 65 year old male who has been having some biliary colick.  He is noted to have some gallstones.  He also has hepatitis C and is being actively treated for that by Dr. Kinnie Scales.  He now presents for elective cholecystectomy and liver biopsy.  TECHNIQUE:  He is placed supine on the operating table and a general anesthetic was administered.  The abdomen was sterilely prepped and draped. Local anesthetic consisting of 0.5% plain Marcaine was infiltrated in the subumbilical region and a subumbilical incision was made, incising the skin sharply.  The subcutaneous tissue was dissected bluntly until the midline fascia was identified and a 1.5 cm incision made in the midline fascia.  A pursestring suture of 0 Vicryl was placed around the fascial edges.  The peritoneal cavity was entered bluntly and under direct vision.  A Hasson trocar was introduced into the peritoneal cavity then pneumoperitoneum created by insufflation of c02 gas.  Next, the laparoscope was introduced and the liver was visualized and it was somewhat irregular and knobby.  Picture were taken.  Next, an 11 mm incision was made in the epigastrium and a similar sized trocar placed through that incision.  Two 5 mm incisions were made in the right abdomen and a similar in sized trocars placed  through them.  The fundus of the gallbladder was grasped and retracted toward the right shoulder.  Omental adhesions were taken down bluntly from the body and infundibulum of the gallbladder.  The infundibulum was then retracted laterally.  The infundibulum was fully mobilized.  The cystic duct was identified and isolated.  ______ with the gallbladder was identified and a clipped placed here.  A incision was made in the cystic duct and a cholangiocatheter was introduced into the cystic duct and a cholangiogram performed.  It demonstrated no obvious filling defects with prompt splash of contrast into the duodenum.  Final report per the radiologist is pending.  Next, the cystic artery was identified, clipped and divided.  The cystic duct was then clipped 3 times proximally and divided.  The gallbladder was dissected free from the liver bed using electrocautery and placed in an endopouch bag.  The liver bed was inspected, irrigated and bleeding points controlled with a cautery.  Next, the anterior surface of the right lobe of the liver was approached and a true cut needle passed through the anterior abdominal wall and two separate core biopsies taken.  These were sent to pathology.  Bleeding from the biopsy site was controlled with a cautery.  The perihepatic area was once again irrigated and no bleeding or bile leakage was noted.  The trocars were then removed and pneumoperitoneum was released. The subumbilical fascial defect was closed by tightening up and tying down the pursestring suture.  Skin incisions were then closed with 4-0  Monocryl subcuticular stitches, followed by Steri-Strips and sterile dressings.  He tolerated the procedure well without any apparent complications.  He was taken to recovery room in satisfactory condition. DD:  12/13/99 TD:  12/13/99 Job: [id number]

## 2010-07-12 NOTE — Op Note (Signed)
NAME:  Tommy Santiago, Tommy Santiago                         ACCOUNT NO.:  [id number]   MEDICAL RECORD NO.:  [id number]                   PATIENT TYPE:  AMB   LOCATION:  DSC                                  FACILITY:  [facility]   PHYSICIAN:  Feliberto Gottron. Turner Daniels, M.D.                DATE OF BIRTH:  1946-01-24   DATE OF PROCEDURE:  05/10/2003  DATE OF DISCHARGE:                                 OPERATIVE REPORT   PREOPERATIVE DIAGNOSIS:  Left knee medial meniscal tear.   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   PROCEDURE:  Left knee arthroscopic debridement of chondromalacia grade 3  with flap tears medial femoral condyle global and removal of cartilaginous  loose bodies.   SURGEON:  Feliberto Gottron. Turner Daniels, M.D.   FIRST ASSISTANT:  Erskine Squibb B. Su Hilt, P.A.-C.   ANESTHESIA:  Local with IV sedation.   ESTIMATED BLOOD LOSS:  Minimal.   FLUIDS REPLACED:  80 mL crystalloid.   DRAINS:  None.   TOURNIQUET TIME:  None.   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  He has failed conservative treatment with anti-  inflammatory medicine, observation, and exercise, and desires arthroscopic  evaluation and treatment of his left knee.  Planned radiographs show little,  if any, arthritic changes.  The primary tenderness is over the medial  femoral condyle and the medial joint line.   DESCRIPTION OF PROCEDURE:  The patient was identified by arm band and taken  to the operating room at [facility].  Appropriate anesthetic  monitors were attached and local anesthesia with IV sedation induced into  the left knee.  Lateral post was applied to the table.  The left lower  extremity was prepped and draped in the usual sterile fashion from the ankle  to the mid thigh.  Using a #11 blade, standard  inferomedial and  inferolateral peripatellar portals were then made allowing introduction of  the arthroscope through the inferolateral portal and the outflow through the  inferomedial portal.  Diagnostic arthroscopy revealed the patella and  trochlea to be in good condition.  We also located a plica which was  inflamed and was removed with a 3.5 Gator sucker.  Moving to the medial  compartment, grade 3 chondromalacia with flap tears of the medial femoral  condyle was identified and debrided.  The ACL and the PCL were intact.  The  medial meniscus was intact and the entire lateral compartment was pristine  except for a fairly significant cartilaginous loose body found in the  popliteal recess and removed with a Gator sucker shaver.  The gutters were  further cleared.  The scope was taken medial and lateral to  the PCL clearing the posterior  compartments.  The knee was irrigated out  with normal saline solution.  The arthroscopic instruments were removed.  A  dressing of Xeroform, 4 by 4 dressing sponges, Webril, and an Ace wrap were  applied.  The patient was then awakened and taken to the recovery room  without difficulty.                                               Feliberto Gottron. Turner Daniels, M.D.    Ovid Curd  D:  05/10/2003  T:  05/11/2003  Job:  [id number]

## 2010-07-12 NOTE — Op Note (Signed)
NAME:  Tommy Santiago, Tommy Santiago                         ACCOUNT NO.:  [id number]   MEDICAL RECORD NO.:  [id number]                   PATIENT TYPE:  AMB   LOCATION:  [facility]                                 FACILITY:  [facility]   PHYSICIAN:  Ronald L. Ovidio Hanger, M.D.           DATE OF BIRTH:  [birth year]   DATE OF PROCEDURE:  08/12/2002  DATE OF DISCHARGE:                                 OPERATIVE REPORT   PREOPERATIVE DIAGNOSIS:  Penile nodule.   POSTOPERATIVE DIAGNOSIS:  Penile nodule.   OPERATION PERFORMED:  Open biopsy of penile nodule.   SURGEON:  Lucrezia Starch. Earlene Plater, M.D.   ANESTHESIA:  Laryngeal airways.   ESTIMATED BLOOD LOSS:  10 mL   TUBES:  None.   COMPLICATIONS:  None.   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 He  notes it developed seven months ago but has enlarged, it is nontender and  simply palpable. He has no pain with erections, no curvature with erections  or other problems. 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 It was felt it was most likely fibrosis but again  malignancy such as a fibrohistiocytoma could not be totally excluded. After  understanding the risks, benefits, and alternatives, we discussed needle  biopsies and etc., he elected to proceed with open biopsy.   DESCRIPTION OF PROCEDURE:  The patient was placed in the supine position and  after proper general laryngeal airway anesthesia was prepped and draped with  Betadine in a sterile fashion. A small transverse 1 cm incision was made  over the nodule which was the distal shaft on the right side. The ________  projection was carried down through the subcutaneous tissue, the nodule was  dissected out, excised and submitted to pathology. It was noted to be  extended into the corpora  cavernosa which was visualized. Thorough  irrigation was performed, good hemostasis noted to be present. The corpora  cavernosa which had approximately a  4 mm opening in it was closed with a running vertical 4-0 chromic catgut,  the subcutaneous tissues were closed with running vertical 4-0 chromic  catgut and the skin was closed transversely with running 4-0 chromic catgut.  Again the specimen was submitted to pathology. The wound was dressed with  Vaseline gauze, 4 x 4's and Coban. The patient tolerated the procedure well  and was taken to the recovery room stable.                                               Ronald L. Ovidio Hanger, M.D.  RLD/MEDQ  D:  08/12/2002  T:  08/13/2002  Job:  [id number]

## 2010-07-25 ENCOUNTER — Encounter: Payer: Self-pay | Admitting: Cardiovascular Disease

## 2010-08-08 ENCOUNTER — Encounter: Payer: Self-pay | Admitting: Cardiovascular Disease

## 2010-08-09 ENCOUNTER — Encounter: Payer: Self-pay | Admitting: Cardiovascular Disease

## 2010-08-09 ENCOUNTER — Ambulatory Visit (INDEPENDENT_AMBULATORY_CARE_PROVIDER_SITE_OTHER): Payer: 59 | Admitting: Cardiovascular Disease

## 2010-08-09 VITALS — BP 127/81 | HR 81 | Ht 69.0 in | Wt 210.0 lb

## 2010-08-09 DIAGNOSIS — I2510 Atherosclerotic heart disease of native coronary artery without angina pectoris: Secondary | ICD-10-CM

## 2010-08-09 DIAGNOSIS — E785 Hyperlipidemia, unspecified: Secondary | ICD-10-CM

## 2010-08-09 MED ORDER — ASPIRIN 81 MG PO TBEC: 81.0000 mg | DELAYED_RELEASE_TABLET | Freq: Every day | ORAL | Status: DC

## 2010-08-09 MED ORDER — EZETIMIBE 10 MG PO TABS: 10.0000 mg | ORAL_TABLET | Freq: Every day | ORAL | Status: DC

## 2010-08-09 NOTE — Patient Instructions (Signed)
Your physician recommends that you schedule a follow-up appointment in: 1 year  Your physician has recommended you make the following change in your medication: START ZETIA 10mg  daily and DECREASE ASPIRIN to 81 mg daily.

## 2010-08-09 NOTE — Progress Notes (Signed)
History of Present Illness:65 yo WM with history of CAD s/p placement Xience DES mid LAD 06/23/08,  HTN, hyperlipidemia, former tobacco abuse and hepatitis C here for scheduled follow up.  He has had one episode of dull chest pain that lasted for two hours and resolved with omeprazole.  He denies any exertional chest pain, SOB, DOE, palpitations, near syncope, syncope, dizziness, orthopnea or PND, LE edema. He has been working full time without problems. He has been taking all of his medications as prescribed. He did have an aura last week, no headache. No weakness.   Labwork from 06/21/09. AST 99 and ALT 186. Total chol: 95. HDL: 28 LDL:56 TG: 53.  He has known hepatitis C and has abnormal LFTs in the past. We have discussed statin therapy in the past and started the Crestor last fall when his LFTs were normal. He discussed the statin therapy with his GI doctor and his statin was stopped. Most recent ALT 60. AST was normal. LDL 139. Total chol: 185. HDL 30.   Past Medical History  Diagnosis Date  . Coronary atherosclerosis of unspecified type of vessel, native or graft     status post cardiac cath w percutaneous coronary intervention using XIENCE drug-eluting stent to mid left anterior descending 07/23/08  . Paroxysmal supraventricular tachycardia   . Other specified cardiac dysrhythmias   . Syncope and collapse   . Other and unspecified hyperlipidemia   . Unspecified chronic bronchitis   . Hypopotassemia   . Lichen planus   . Plantar fascial fibromatosis   . Personal history of other infectious and parasitic disease     Past Surgical History  Procedure Date  . Cholecystectomy   . Knee arthroscopy   . Esophagogastroduodenoscopy     status post  . Colonscopy     status post    Current Outpatient Prescriptions  Medication Sig Dispense Refill  . aspirin EC 325 MG tablet Take 325 mg by mouth daily.        Marland Kitchen buPROPion (WELLBUTRIN XL) 300 MG 24 hr tablet Take 300 mg by mouth daily.        .  Calcium Carbonate-Vitamin D (CALCIUM 600/VITAMIN D) 600-400 MG-UNIT per tablet Take 1 tablet by mouth 2 (two) times daily.        . Cholecalciferol (VITAMIN D3) 2000 UNITS capsule Take 2,000 Units by mouth daily.        . diphenhydramine-acetaminophen (TYLENOL PM EXTRA STRENGTH) 25-500 MG TABS Take 1 tablet by mouth as needed. Use as directed       . FOLIC ACID PO Take 400 mcg by mouth daily.        . Omega-3 Fatty Acids (FISH OIL) 1000 MG CAPS Take by mouth daily. 2 caps       . omeprazole (PRILOSEC) 20 MG capsule Take 20 mg by mouth as needed.       . ranitidine (ZANTAC) 150 MG capsule Take 150 mg by mouth as needed.        . tadalafil (CIALIS) 20 MG tablet Take 20 mg by mouth daily as needed.        . valsartan (DIOVAN) 160 MG tablet Take 160 mg by mouth daily.        . vitamin C (ASCORBIC ACID) 500 MG tablet Take 500 mg by mouth daily.        . Zinc 50 MG TABS Take by mouth daily. 1 cap       . DISCONTD: buPROPion (BUDEPRION SR) 150 MG  12 hr tablet Take 150 mg by mouth daily.        Marland Kitchen DISCONTD: rosuvastatin (CRESTOR) 40 MG tablet Take 40 mg by mouth daily.        Marland Kitchen DISCONTD: vitamin B-12 (CYANOCOBALAMIN) 250 MCG tablet Take 250 mcg by mouth daily.          Allergies not on file  History   Social History  . Marital Status: Married    Spouse Name: N/A    Number of Children: N/A  . Years of Education: N/A   Occupational History  . Not on file.   Social History Main Topics  . Smoking status: Former Games developer  . Smokeless tobacco: Not on file   Comment: greater than 40 pack per year...quit 1989  . Alcohol Use: Yes     quit 25 years ago   . Drug Use: Yes     quit 25 years ago  . Sexually Active:    Other Topics Concern  . Not on file   Social History Narrative  . No narrative on file    Family History  Problem Relation Age of Onset  . Coronary artery disease Brother     diagnosed in early 25s  . Coronary artery disease Other     hx family    Review of Systems:  As  stated in the HPI and otherwise negative.   BP 127/81  Pulse 81  Ht 5\' 9"  (1.753 m)  Wt 210 lb (95.255 kg)  BMI 31.01 kg/m2  Physical Examination: General: Well developed, well nourished, NAD HEENT: OP clear, mucus membranes moist SKIN: warm, dry. No rashes. Neuro: No focal deficits Musculoskeletal: Muscle strength 5/5 all ext Psychiatric: Mood and affect normal Neck: No JVD, no carotid bruits, no thyromegaly, no lymphadenopathy. Lungs:Clear bilaterally, no wheezes, rhonci, crackles Cardiovascular: Regular rate and rhythm. No murmurs, gallops or rubs. Abdomen:Soft. Bowel sounds present. Non-tender.  Extremities: No lower extremity edema. Pulses are 2 + in the bilateral DP/PT.  EKG:

## 2010-08-09 NOTE — Assessment & Plan Note (Signed)
Stable. Will lower ASA to 81 mg po once daily. BP well controlled.

## 2010-08-09 NOTE — Assessment & Plan Note (Signed)
LDL not at goal. He cannot take a statin because of his hepatitis. Will start Zetia 10 mg once daily.

## 2010-08-09 NOTE — Assessment & Plan Note (Signed)
>>  ASSESSMENT AND PLAN FOR CAD, NATIVE VESSEL WRITTEN ON 08/09/2010  9:03 AM BY MCALHANY, CHRISTOPHER D, MD  Stable. Will lower ASA to 81 mg po once daily. BP well controlled.

## 2010-09-26 ENCOUNTER — Other Ambulatory Visit: Payer: Self-pay | Admitting: Gastroenterology

## 2010-09-26 DIAGNOSIS — B182 Chronic viral hepatitis C: Secondary | ICD-10-CM

## 2010-09-26 DIAGNOSIS — K7030 Alcoholic cirrhosis of liver without ascites: Secondary | ICD-10-CM

## 2010-10-01 ENCOUNTER — Ambulatory Visit
Admission: RE | Admit: 2010-10-01 | Discharge: 2010-10-01 | Disposition: A | Discharge status: Home / Self Care | Payer: 59 | Source: Ambulatory Visit | Attending: Gastroenterology | Admitting: Gastroenterology

## 2010-10-01 DIAGNOSIS — B182 Chronic viral hepatitis C: Secondary | ICD-10-CM

## 2010-10-01 DIAGNOSIS — K7030 Alcoholic cirrhosis of liver without ascites: Secondary | ICD-10-CM

## 2011-04-15 ENCOUNTER — Other Ambulatory Visit: Payer: Self-pay | Admitting: Gastroenterology

## 2011-04-17 ENCOUNTER — Other Ambulatory Visit: Payer: 59

## 2011-04-23 ENCOUNTER — Ambulatory Visit
Admission: RE | Admit: 2011-04-23 | Discharge: 2011-04-23 | Disposition: A | Discharge status: Home / Self Care | Payer: 59 | Source: Ambulatory Visit | Attending: Gastroenterology | Admitting: Gastroenterology

## 2011-08-05 ENCOUNTER — Ambulatory Visit (INDEPENDENT_AMBULATORY_CARE_PROVIDER_SITE_OTHER): Payer: 59 | Admitting: Cardiovascular Disease

## 2011-08-05 ENCOUNTER — Encounter: Payer: Self-pay | Admitting: Cardiovascular Disease

## 2011-08-05 VITALS — BP 120/82 | HR 68 | Ht 71.0 in | Wt 214.4 lb

## 2011-08-05 DIAGNOSIS — I2510 Atherosclerotic heart disease of native coronary artery without angina pectoris: Secondary | ICD-10-CM

## 2011-08-05 NOTE — Patient Instructions (Signed)
Your physician wants you to follow-up in:  6 months. You will receive a reminder letter in the mail two months in advance. If you don't receive a letter, please call our office to schedule the follow-up appointment.  Your physician has requested that you have a stress echocardiogram. For further information please visit [email or web address]. Please follow instruction sheet as given.    

## 2011-08-05 NOTE — Assessment & Plan Note (Signed)
He had known CAD with DES to the LAD in April 2010, now having exertional chest pressure and pain. Will arrange exercise stress echo to exclude ischemia. He does not tolerate statins.

## 2011-08-05 NOTE — Assessment & Plan Note (Signed)
>>  ASSESSMENT AND PLAN FOR CAD, NATIVE VESSEL WRITTEN ON 08/05/2011 11:57 AM BY Verne Carrow D, MD  He had known CAD with DES to the LAD in April 2010, now having exertional chest pressure and pain. Will arrange exercise stress echo to exclude ischemia. He does not tolerate statins.

## 2011-08-05 NOTE — Progress Notes (Signed)
History of Present Illness: 66 yo WM with history of CAD s/p placement Xience DES mid LAD 06/23/08, HTN, hyperlipidemia, former tobacco abuse and hepatitis C here for scheduled follow up. He has been on a statin in the past but this was stopped because of his GI disease.   He has had several episodes of chest pain and heaviness with exertion. One time was two weeks ago when putting out mulch. He felt chest pressure followed by weakness and had to sit down. This resolves with rest. He had another episode with arm pain and heaviness after work last week. Denies palpitations, near syncope, syncope, dizziness, orthopnea or PND, LE edema. He has been working full time. He has been taking all of his medications as prescribed. He does not tolerate statins.   Primary Care Physician: [name]  Last Lipid Profile: Total Chol: 180  TG 116  HDL  30  LDL 127  Jul 16, 2011  Past Medical History  Diagnosis Date  . Coronary atherosclerosis of unspecified type of vessel, native or graft     status post cardiac cath w percutaneous coronary intervention using XIENCE drug-eluting stent to mid left anterior descending 07/23/08  . Paroxysmal supraventricular tachycardia   . Other specified cardiac dysrhythmias   . Syncope and collapse   . Other and unspecified hyperlipidemia   . Unspecified chronic bronchitis   . Hypopotassemia   . Lichen planus   . Plantar fascial fibromatosis   . Personal history of other infectious and parasitic disease     Past Surgical History  Procedure Date  . Cholecystectomy   . Knee arthroscopy   . Esophagogastroduodenoscopy     status post  . Colonscopy     status post    Current Outpatient Prescriptions  Medication Sig Dispense Refill  . aspirin 325 MG tablet Take 325 mg by mouth daily.      . B Complex-C-E-Zn (B COMPLEX-C-E-ZINC) tablet Take 1 tablet by mouth daily.      Marland Kitchen buPROPion (WELLBUTRIN XL) 300 MG 24 hr tablet Take 300 mg by mouth daily.        . Calcium  Carbonate-Vitamin D (CALCIUM 600/VITAMIN D) 600-400 MG-UNIT per tablet Take 1 tablet by mouth 2 (two) times daily.        . Cholecalciferol (VITAMIN D3) 2000 UNITS capsule Take 2,000 Units by mouth daily.        . diphenhydramine-acetaminophen (TYLENOL PM EXTRA STRENGTH) 25-500 MG TABS Take 1 tablet by mouth as needed. Use as directed       . hydrochlorothiazide (HYDRODIURIL) 25 MG tablet Take 25 mg by mouth daily.      . Magnesium Oxide (MAG-OX 400 PO) Take 400 mg by mouth daily.      . Omega-3 Fatty Acids (FISH OIL) 1000 MG CAPS Take by mouth daily. 2 caps       . omeprazole (PRILOSEC) 20 MG capsule Take 20 mg by mouth as needed.       . psyllium (METAMUCIL) 58.6 % powder Take 1 packet by mouth daily.      . ranitidine (ZANTAC) 150 MG capsule Take 150 mg by mouth as needed.        . tadalafil (CIALIS) 20 MG tablet Take 20 mg by mouth daily as needed.        . valsartan (DIOVAN) 160 MG tablet Take 160 mg by mouth daily.        . vitamin C (ASCORBIC ACID) 500 MG tablet Take 500 mg  by mouth daily.        . vitamin E 400 UNIT capsule Take 400 Units by mouth daily.      . Zinc 50 MG TABS Take by mouth daily. 1 cap         No Known Allergies  History   Social History  . Marital Status: Married    Spouse Name: N/A    Number of Children: N/A  . Years of Education: N/A   Occupational History  . Not on file.   Social History Main Topics  . Smoking status: Former Games developer  . Smokeless tobacco: Not on file   Comment: greater than 40 pack per year...quit 1989  . Alcohol Use: Yes     quit 25 years ago   . Drug Use: Yes     quit 25 years ago  . Sexually Active:    Other Topics Concern  . Not on file   Social History Narrative  . No narrative on file    Family History  Problem Relation Age of Onset  . Coronary artery disease Brother     diagnosed in early 15s  . Coronary artery disease Other     hx family    Review of Systems:  As stated in the HPI and otherwise negative.   BP  120/82  Pulse 68  Ht 5\' 11"  (1.803 m)  Wt 214 lb 6.4 oz (97.251 kg)  BMI 29.90 kg/m2  Physical Examination: General: Well developed, well nourished, NAD HEENT: OP clear, mucus membranes moist SKIN: warm, dry. No rashes. Neuro: No focal deficits Musculoskeletal: Muscle strength 5/5 all ext Psychiatric: Mood and affect normal Neck: No JVD, no carotid bruits, no thyromegaly, no lymphadenopathy. Lungs:Clear bilaterally, no wheezes, rhonci, crackles Cardiovascular: Regular rate and rhythm. No murmurs, gallops or rubs. Abdomen:Soft. Bowel sounds present. Non-tender.  Extremities: No lower extremity edema. Pulses are 2 + in the bilateral DP/PT.  EKG: NSR, rate 68 bpm with frequent PVCs.

## 2011-08-06 ENCOUNTER — Inpatient Hospital Stay (HOSPITAL_BASED_OUTPATIENT_CLINIC_OR_DEPARTMENT_OTHER)
Admission: EM | Admit: 2011-08-06 | Discharge: 2011-08-07 | DRG: 287 | Disposition: A | Discharge status: Home / Self Care | Payer: 59 | Attending: Internal Medicine | Admitting: Internal Medicine

## 2011-08-06 ENCOUNTER — Emergency Department (HOSPITAL_BASED_OUTPATIENT_CLINIC_OR_DEPARTMENT_OTHER): Payer: 59

## 2011-08-06 ENCOUNTER — Encounter (HOSPITAL_BASED_OUTPATIENT_CLINIC_OR_DEPARTMENT_OTHER): Payer: Self-pay | Admitting: *Deleted

## 2011-08-06 DIAGNOSIS — I10 Essential (primary) hypertension: Secondary | ICD-10-CM | POA: Diagnosis present

## 2011-08-06 DIAGNOSIS — I471 Supraventricular tachycardia, unspecified: Secondary | ICD-10-CM | POA: Diagnosis present

## 2011-08-06 DIAGNOSIS — I209 Angina pectoris, unspecified: Secondary | ICD-10-CM | POA: Diagnosis present

## 2011-08-06 DIAGNOSIS — J42 Unspecified chronic bronchitis: Secondary | ICD-10-CM | POA: Diagnosis present

## 2011-08-06 DIAGNOSIS — M722 Plantar fascial fibromatosis: Secondary | ICD-10-CM | POA: Diagnosis present

## 2011-08-06 DIAGNOSIS — Z7982 Long term (current) use of aspirin: Secondary | ICD-10-CM

## 2011-08-06 DIAGNOSIS — E785 Hyperlipidemia, unspecified: Secondary | ICD-10-CM | POA: Diagnosis present

## 2011-08-06 DIAGNOSIS — E876 Hypokalemia: Secondary | ICD-10-CM | POA: Diagnosis present

## 2011-08-06 DIAGNOSIS — B1920 Unspecified viral hepatitis C without hepatic coma: Secondary | ICD-10-CM | POA: Diagnosis present

## 2011-08-06 DIAGNOSIS — R079 Chest pain, unspecified: Secondary | ICD-10-CM

## 2011-08-06 DIAGNOSIS — Z79899 Other long term (current) drug therapy: Secondary | ICD-10-CM

## 2011-08-06 DIAGNOSIS — Z87891 Personal history of nicotine dependence: Secondary | ICD-10-CM

## 2011-08-06 DIAGNOSIS — I2510 Atherosclerotic heart disease of native coronary artery without angina pectoris: Principal | ICD-10-CM | POA: Diagnosis present

## 2011-08-06 DIAGNOSIS — K219 Gastro-esophageal reflux disease without esophagitis: Secondary | ICD-10-CM | POA: Diagnosis present

## 2011-08-06 LAB — COMPREHENSIVE METABOLIC PANEL
ALT: 56 U/L — ABNORMAL HIGH (ref 0–53)
AST: 36 U/L (ref 0–37)
CO2: 22 mEq/L (ref 19–32)
Calcium: 9.3 mg/dL (ref 8.4–10.5)
Chloride: 100 mEq/L (ref 96–112)
GFR calc non Af Amer: 78 mL/min — ABNORMAL LOW (ref >90)
Sodium: 135 mEq/L (ref 135–145)

## 2011-08-06 LAB — CBC
HCT: 42.8 % (ref 39.0–52.0)
Hemoglobin: 15.2 g/dL (ref 13.0–17.0)
MCH: 34.4 pg — ABNORMAL HIGH (ref 26.0–34.0)
MCHC: 35.5 g/dL (ref 30.0–36.0)
MCV: 96.8 fL (ref 78.0–100.0)
Platelets: 221 10*3/uL (ref 150–400)
RDW: 12.1 % (ref 11.5–15.5)
RDW: 12.4 % (ref 11.5–15.5)
WBC: 8.7 10*3/uL (ref 4.0–10.5)

## 2011-08-06 LAB — CARDIAC PANEL(CRET KIN+CKTOT+MB+TROPI)
Relative Index: INVALID (ref 0.0–2.5)
Total CK: 34 U/L (ref 7–232)
Troponin I: <0.3 ng/mL (ref <0.30)

## 2011-08-06 LAB — CREATININE, SERUM: GFR calc non Af Amer: 87 mL/min — ABNORMAL LOW (ref >90)

## 2011-08-06 LAB — DIFFERENTIAL
Basophils Absolute: 0 10*3/uL (ref 0.0–0.1)
Eosinophils Relative: 1 % (ref 0–5)
Lymphocytes Relative: 21 % (ref 12–46)
Neutro Abs: 6.2 10*3/uL (ref 1.7–7.7)

## 2011-08-06 MED ORDER — METOPROLOL TARTRATE 12.5 MG HALF TABLET
12.5000 mg | ORAL_TABLET | Freq: Four times a day (QID) | ORAL | Status: DC
  Administered 2011-08-06: 12.5 mg via ORAL
  Filled 2011-08-06 (×6): qty 1

## 2011-08-06 MED ORDER — HEPARIN (PORCINE) IN NACL 100-0.45 UNIT/ML-% IJ SOLN: 10.0000 [IU]/kg/h | INTRAMUSCULAR | Status: DC

## 2011-08-06 MED ORDER — ONDANSETRON HCL 4 MG/2ML IJ SOLN: 4.0000 mg | Freq: Four times a day (QID) | INTRAMUSCULAR | Status: DC | PRN

## 2011-08-06 MED ORDER — ASPIRIN 81 MG PO CHEW
324.0000 mg | CHEWABLE_TABLET | ORAL | Status: AC
  Administered 2011-08-06: 324 mg via ORAL

## 2011-08-06 MED ORDER — VITAMIN D3 25 MCG (1000 UNIT) PO TABS
2000.0000 [IU] | ORAL_TABLET | Freq: Every day | ORAL | Status: DC
  Filled 2011-08-06: qty 2

## 2011-08-06 MED ORDER — IRBESARTAN 150 MG PO TABS
150.0000 mg | ORAL_TABLET | Freq: Every day | ORAL | Status: DC
  Filled 2011-08-06: qty 1

## 2011-08-06 MED ORDER — SODIUM CHLORIDE 0.9 % IJ SOLN
3.0000 mL | Freq: Two times a day (BID) | INTRAMUSCULAR | Status: DC
  Administered 2011-08-06: 3 mL via INTRAVENOUS

## 2011-08-06 MED ORDER — PANTOPRAZOLE SODIUM 40 MG PO TBEC
40.0000 mg | DELAYED_RELEASE_TABLET | Freq: Every day | ORAL | Status: DC
  Administered 2011-08-07: 40 mg via ORAL
  Filled 2011-08-06: qty 1

## 2011-08-06 MED ORDER — HEPARIN NICU/PEDS BOLUS VIA INFUSION
4000.0000 [IU] | Freq: Once | INTRAVENOUS | Status: AC
  Administered 2011-08-06: 4000 [IU] via INTRAVENOUS
  Filled 2011-08-06: qty 4000

## 2011-08-06 MED ORDER — SODIUM CHLORIDE 0.9 % IJ SOLN: 3.0000 mL | INTRAMUSCULAR | Status: DC | PRN

## 2011-08-06 MED ORDER — SODIUM CHLORIDE 0.9 % IV SOLN: 250.0000 mL | INTRAVENOUS | Status: DC | PRN

## 2011-08-06 MED ORDER — ASPIRIN 81 MG PO CHEW
324.0000 mg | CHEWABLE_TABLET | ORAL | Status: AC
  Administered 2011-08-07: 324 mg via ORAL
  Filled 2011-08-06 (×2): qty 4

## 2011-08-06 MED ORDER — NITROGLYCERIN 0.4 MG SL SUBL: 0.4000 mg | SUBLINGUAL_TABLET | SUBLINGUAL | Status: DC | PRN

## 2011-08-06 MED ORDER — SODIUM CHLORIDE 0.9 % IV SOLN
1.0000 mL/kg/h | INTRAVENOUS | Status: DC
  Administered 2011-08-07: 1 mL/kg/h via INTRAVENOUS

## 2011-08-06 MED ORDER — ASPIRIN 325 MG PO TABS: 325.0000 mg | ORAL_TABLET | Freq: Every day | ORAL | Status: DC

## 2011-08-06 MED ORDER — PSYLLIUM 95 % PO PACK
1.0000 | PACK | Freq: Every day | ORAL | Status: DC
  Filled 2011-08-06: qty 1

## 2011-08-06 MED ORDER — BUPROPION HCL ER (XL) 300 MG PO TB24
300.0000 mg | ORAL_TABLET | Freq: Every day | ORAL | Status: DC
  Filled 2011-08-06: qty 1

## 2011-08-06 MED ORDER — VITAMIN D3 50 MCG (2000 UT) PO CAPS: 2000.0000 [IU] | ORAL_CAPSULE | Freq: Every day | ORAL | Status: DC

## 2011-08-06 MED ORDER — SODIUM CHLORIDE 0.9 % IJ SOLN: 3.0000 mL | Freq: Two times a day (BID) | INTRAMUSCULAR | Status: DC

## 2011-08-06 MED ORDER — ASPIRIN 300 MG RE SUPP
300.0000 mg | RECTAL | Status: AC
  Filled 2011-08-06: qty 1

## 2011-08-06 MED ORDER — CALCIUM CARBONATE-VITAMIN D 500-200 MG-UNIT PO TABS
1.0000 | ORAL_TABLET | Freq: Two times a day (BID) | ORAL | Status: DC
  Administered 2011-08-06: 1 via ORAL
  Filled 2011-08-06 (×3): qty 1

## 2011-08-06 MED ORDER — MAGNESIUM OXIDE 400 MG PO TABS
400.0000 mg | ORAL_TABLET | Freq: Every day | ORAL | Status: DC
  Filled 2011-08-06: qty 1

## 2011-08-06 MED ORDER — ACETAMINOPHEN 325 MG PO TABS: 650.0000 mg | ORAL_TABLET | ORAL | Status: DC | PRN

## 2011-08-06 MED ORDER — HEPARIN (PORCINE) IN NACL 100-0.45 UNIT/ML-% IJ SOLN
1250.0000 [IU]/h | INTRAMUSCULAR | Status: DC
  Administered 2011-08-06: 1250 [IU]/h via INTRAVENOUS
  Filled 2011-08-06 (×2): qty 250

## 2011-08-06 MED ORDER — HEPARIN SODIUM (PORCINE) 5000 UNIT/ML IJ SOLN: 5000.0000 [IU] | Freq: Three times a day (TID) | INTRAMUSCULAR | Status: DC

## 2011-08-06 MED ORDER — CALCIUM CARBONATE-VITAMIN D 600-400 MG-UNIT PO TABS: 1.0000 | ORAL_TABLET | Freq: Two times a day (BID) | ORAL | Status: DC

## 2011-08-06 MED ORDER — HEPARIN (PORCINE) IN NACL 100-0.45 UNIT/ML-% IJ SOLN: 1000.0000 [IU]/h | INTRAMUSCULAR | Status: DC

## 2011-08-06 MED ORDER — HYDROCHLOROTHIAZIDE 25 MG PO TABS
25.0000 mg | ORAL_TABLET | Freq: Every day | ORAL | Status: DC
  Filled 2011-08-06: qty 1

## 2011-08-06 NOTE — ED Notes (Signed)
Attempted to call report but the receiving nurse is not available.  Sts she will call back.

## 2011-08-06 NOTE — ED Provider Notes (Signed)
History     CSN: [id number]  Arrival date & time 08/06/11  1610   First MD Initiated Contact with Patient 08/06/11 1619      Chief Complaint  Patient presents with  . Chest Pain    (Consider location/radiation/quality/duration/timing/severity/associated sxs/prior treatment) HPI Comments: Patient has history of CAD with stent about 3 years ago.  He had an episode similar to this on Monday, saw Dr. [name] yesterday, and is scheduled for a stress test on Tuesday.  He has had three episodes today of pressure in his chest associated with bilateral arm heaviness and according to his wife, became very pale.    Patient is a 66 y.o. male presenting with chest pain. The history is provided by the patient.  Chest Pain Episode onset: this morning. Chest pain occurs intermittently. The chest pain is resolved. Associated with: nothing. The severity of the pain is moderate. The quality of the pain is described as pressure-like. Radiates to: both arms feels heavy when symptoms are present. Exacerbated by: nothing. Pertinent negatives for primary symptoms include no fever, no shortness of breath, no cough, no abdominal pain, no nausea and no vomiting. He tried nothing for the symptoms.     Past Medical History  Diagnosis Date  . Coronary atherosclerosis of unspecified type of vessel, native or graft     status post cardiac cath w percutaneous coronary intervention using XIENCE drug-eluting stent to mid left anterior descending 07/23/08  . Paroxysmal supraventricular tachycardia   . Other specified cardiac dysrhythmias   . Syncope and collapse   . Other and unspecified hyperlipidemia   . Unspecified chronic bronchitis   . Hypopotassemia   . Lichen planus   . Plantar fascial fibromatosis   . Personal history of other infectious and parasitic disease     Past Surgical History  Procedure Date  . Cholecystectomy   . Knee arthroscopy   . Esophagogastroduodenoscopy     status post  . Colonscopy      status post    Family History  Problem Relation Age of Onset  . Coronary artery disease Brother     diagnosed in early 56s  . Coronary artery disease Other     hx family    History  Substance Use Topics  . Smoking status: Former Games developer  . Smokeless tobacco: Not on file   Comment: greater than 40 pack per year...quit 1989  . Alcohol Use: Yes     quit 25 years ago       Review of Systems  Constitutional: Negative for fever.  Respiratory: Negative for cough and shortness of breath.   Cardiovascular: Positive for chest pain.  Gastrointestinal: Negative for nausea, vomiting and abdominal pain.  All other systems reviewed and are negative.    Allergies  Review of patient's allergies indicates no known allergies.  Home Medications   Current Outpatient Rx  Name Route Sig Dispense Refill  . ASPIRIN 325 MG PO TABS Oral Take 325 mg by mouth daily.    . STRESS FORMULA/ZINC PO TABS Oral Take 1 tablet by mouth daily.    . BUPROPION HCL ER (XL) 300 MG PO TB24 Oral Take 300 mg by mouth daily.      Marland Kitchen CALCIUM CARBONATE-VITAMIN D 600-400 MG-UNIT PO TABS Oral Take 1 tablet by mouth 2 (two) times daily.      Marland Kitchen VITAMIN D3 2000 UNITS PO CAPS Oral Take 2,000 Units by mouth daily.      Marland Kitchen DIPHENHYDRAMINE-APAP (SLEEP) 25-500 MG PO TABS  Oral Take 1 tablet by mouth as needed. Use as directed.  Patient uses this medication for pain.    Marland Kitchen HYDROCHLOROTHIAZIDE 25 MG PO TABS Oral Take 25 mg by mouth daily.    Marland Kitchen MAG-OX 400 PO Oral Take 400 mg by mouth daily.    Marland Kitchen FISH OIL 1000 MG PO CAPS Oral Take by mouth daily. 2 caps     . OMEPRAZOLE 20 MG PO CPDR Oral Take 20 mg by mouth as needed. Patient uses this medication for acid reflux.    . PSYLLIUM 58.6 % PO POWD Oral Take 1 packet by mouth daily. Patient uses this mediation for regularity.    Marland Kitchen TADALAFIL 20 MG PO TABS Oral Take 20 mg by mouth daily as needed. Patient uses this medication for ED.    Marland Kitchen VALSARTAN 160 MG PO TABS Oral Take 160 mg by mouth  daily.      Marland Kitchen VITAMIN C 500 MG PO TABS Oral Take 500 mg by mouth daily.      Marland Kitchen VITAMIN E 400 UNITS PO CAPS Oral Take 400 Units by mouth daily.    Marland Kitchen ZINC 50 MG PO TABS Oral Take by mouth daily. 1 cap       BP 125/94  Pulse 92  Temp 98.4 F (36.9 C) (Oral)  Resp 16  Ht 5\' 11"  (1.803 m)  Wt 213 lb (96.616 kg)  BMI 29.71 kg/m2  SpO2 100%  Physical Exam  Nursing note and vitals reviewed. Constitutional: He is oriented to person, place, and time. He appears well-developed and well-nourished. No distress.  HENT:  Head: Normocephalic and atraumatic.  Mouth/Throat: Oropharynx is clear and moist.  Neck: Normal range of motion. Neck supple.  Cardiovascular: Normal rate and regular rhythm.   No murmur heard. Pulmonary/Chest: Effort normal and breath sounds normal. No respiratory distress. He has no wheezes.  Abdominal: Soft. Bowel sounds are normal. He exhibits no distension. There is no tenderness.  Musculoskeletal: Normal range of motion. He exhibits no edema.  Neurological: He is alert and oriented to person, place, and time.  Skin: Skin is warm and dry. He is not diaphoretic.    ED Course  Procedures (including critical care time)   Labs Reviewed  CBC  DIFFERENTIAL  COMPREHENSIVE METABOLIC PANEL  TROPONIN I   No results found.   No diagnosis found.   Date: 08/06/2011  Rate: 91  Rhythm: normal sinus rhythm  QRS Axis: normal  Intervals: normal  ST/T Wave abnormalities: normal  Conduction Disutrbances:none  Narrative Interpretation:   Old EKG Reviewed: unchanged    MDM  The patient presents with an accelerating angina pattern over the past few days, but with unchanged ekg and negative troponin.  He is currently pain-free.  I have spoken with Dr. Teressa Lower at [facility] who agrees to accept the patient in transfer.  He recommendation is to start heparin.          Geoffery Lyons, MD 08/06/11 1731

## 2011-08-06 NOTE — ED Notes (Signed)
Report given to [facility] with [facility]. approx out.

## 2011-08-06 NOTE — ED Notes (Signed)
No callback from floor nurse.  Attempted to call again.  No answer on unit phone.

## 2011-08-06 NOTE — ED Notes (Signed)
Pt c/o chest tightness x 2 days and sched for stress test 6/18.

## 2011-08-06 NOTE — H&P (Signed)
History of Present Illness:  66 yo WM with history of CAD s/p placement Xience DES mid LAD 06/23/08, HTN, hyperlipidemia, former tobacco abuse and hepatitis C without cirrhosis was admitted tonight with chest pain.   He has had several episodes of chest pain and heaviness with exertion. One time was two weeks ago when putting out mulch. He felt chest pressure followed by weakness and had to sit down. This resolved with rest. 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  He saw Dr. [name] yesterday and was set up for a stress echo.   Today, patient noted chest pressure with radiation down both arms while reading a magazine on the couch.  He was pale and diaphoretic.  This resolved in 10 minutes. About 2 hours later, he had a similar episode while walking into a restaurant then another brief episode walking out of the restaurant.  He and his wife then went to [employer].  In [employer], he had another episode of severe chest pressure radiating down the arms.  He sat down and rested, and the symptoms resolved.  He decided to come to the ER.  In the ER, ECG was unremarkable and troponin was negative.  Prior to a couple of weeks ago, he had had no chest pain since his stent was placed in 2010.   ECG: NSR, normal  CXR: Bibasilar atelectasis.   Primary Care Physician: [name]   Last Lipid Profile: Total Chol: 180 TG 116 HDL 30 LDL 127 Jul 16, 2011   Past Medical History   Diagnosis  Date   .  Coronary atherosclerosis of unspecified type of vessel, native or graft      status post cardiac cath w percutaneous coronary intervention using XIENCE drug-eluting stent to mid left anterior descending 07/23/08   .  Paroxysmal supraventricular tachycardia    .  HTN   .  Syncope and collapse    .  Hyperlipidemia: Increased LFTs with statins and Zetia so does not take.    Marland Kitchen  Unspecified chronic bronchitis    .  Hypopotassemia    .  GERD   .   Plantar fascial fibromatosis    .  HCV: No cirrhosis per patient.  Followed by Dr. Kinnie Scales.     Past Surgical History   Procedure  Date   .  Cholecystectomy    .  Knee arthroscopy    .  Esophagogastroduodenoscopy      status post   .  Colonscopy      status post    Current Outpatient Prescriptions   Medication  Sig  Dispense  Refill   .  aspirin 325 MG tablet  Take 325 mg by mouth daily.     .  B Complex-C-E-Zn (B COMPLEX-C-E-ZINC) tablet  Take 1 tablet by mouth daily.     Marland Kitchen  buPROPion (WELLBUTRIN XL) 300 MG 24 hr tablet  Take 300 mg by mouth daily.     .  Calcium Carbonate-Vitamin D (CALCIUM 600/VITAMIN D) 600-400 MG-UNIT per tablet  Take 1 tablet by mouth 2 (two) times daily.     .  Cholecalciferol (VITAMIN D3) 2000 UNITS capsule  Take 2,000 Units by mouth daily.     .  diphenhydramine-acetaminophen (TYLENOL PM EXTRA STRENGTH) 25-500 MG TABS  Take 1 tablet by mouth as needed. Use as directed     .  hydrochlorothiazide (HYDRODIURIL) 25 MG tablet  Take 25  mg by mouth daily.     .  Magnesium Oxide (MAG-OX 400 PO)  Take 400 mg by mouth daily.     .  Omega-3 Fatty Acids (FISH OIL) 1000 MG CAPS  Take by mouth daily. 2 caps     .  omeprazole (PRILOSEC) 20 MG capsule  Take 20 mg by mouth as needed.     .  psyllium (METAMUCIL) 58.6 % powder  Take 1 packet by mouth daily.     .  ranitidine (ZANTAC) 150 MG capsule  Take 150 mg by mouth as needed.     .  tadalafil (CIALIS) 20 MG tablet  Take 20 mg by mouth daily as needed.     .  valsartan (DIOVAN) 160 MG tablet  Take 160 mg by mouth daily.     .  vitamin C (ASCORBIC ACID) 500 MG tablet  Take 500 mg by mouth daily.     .  vitamin E 400 UNIT capsule  Take 400 Units by mouth daily.     .  Zinc 50 MG TABS  Take by mouth daily. 1 cap      No Known Allergies  History    Social History   .  Marital Status:  Married     Spouse Name:  N/A     Number of Children:  N/A   .  Years of Education:  N/A    Occupational History   .  RN, works at  [employer] History Main Topics   .  Smoking status:  Former Games developer   .  Smokeless tobacco:  Not on file     Comment: greater than 40 pack per year...quit 1989    .  Alcohol Use:  Yes      quit 25 years ago   .  Drug Use:  Yes      quit 25 years ago   .  Sexually Active:     Other Topics  Concern   .  Not on file    Social History Narrative   .  No narrative on file    Family History   Problem  Relation  Age of Onset   .  Coronary artery disease  Brother       diagnosed in early 57s    .  Coronary artery disease  Other       hx family    Review of Systems: As stated in the HPI and otherwise negative.   Physical Exam: Blood pressure 132/81, pulse 64, temperature 97.8 F (36.6 C), temperature source Oral, resp. rate 17, height 5\' 11"  (1.803 m), weight 96.616 kg (213 lb), SpO2 96.00%.  General: NAD Neck: No JVD, no thyromegaly or thyroid nodule.  Lungs: Clear to auscultation bilaterally with normal respiratory effort. CV: Nondisplaced PMI.  Heart regular S1/S2, no S3/S4, no murmur.  No peripheral edema.  No carotid bruit.  Normal pedal pulses.  Abdomen: Soft, nontender, no hepatosplenomegaly, no distention.  Skin: Intact without lesions or rashes. Many tattoos.  Neurologic: Alert and oriented x 3.  Psych: Normal affect. Extremities: No clubbing or cyanosis.  HEENT: Normal.   Labs:   Lab Results  Component Value Date   WBC 8.7 08/06/2011   HGB 16.4 08/06/2011   HCT 44.4 08/06/2011   MCV 94.9 08/06/2011   PLT 221 08/06/2011    Lab 08/06/11 1615  NA 135  K 3.5  CL 100  CO2 22  BUN 20  CREATININE 1.00  CALCIUM 9.3  PROT 7.2  BILITOT 0.5  ALKPHOS 71  ALT 56*  AST 36  GLUCOSE 128*   Lab Results  Component Value Date   TROPONINI <0.30 08/06/2011    Lab Results  Component Value Date   CHOL 94 08/11/2008   Lab Results  Component Value Date   HDL 27.60* 08/11/2008   Lab Results  Component Value Date   LDLCALC 54 08/11/2008   Lab Results  Component  Value Date   TRIG 62.0 08/11/2008   Lab Results  Component Value Date   CHOLHDL 3 08/11/2008   ASSESSMENT AND PLAN:  66 yo with history of CAD s/p LAD PCI in 2010, HCV, HTN, hyperlipidemia presents with symptoms concerning for unstable angina.  1. Chest pain: Symptoms with minimal exertion and at rest.  Story is concerning for unstable angina.  CEs so far negative and ECG non-acute.  He has not been on a statin due to history of elevated LFTs with statins in the setting of HCV.  - Cycle cardiac enzymes and ECGs.  - ASA, heparin gtt, low dose beta blocker, continue ARB.  - Will plan left heart cath in the morning.  2. Hyperlipidemia: Hold off on statin given history of elevated LFTs with statin use in setting of HCV.  Will need to explore this farther, may tolerate low dose of statin in the future.  3. HCV: No cirrhosis per patient.   Signed: Marca Ancona 08/06/2011, 10:07 PM

## 2011-08-07 ENCOUNTER — Encounter (HOSPITAL_COMMUNITY)
Admission: EM | Disposition: A | Discharge status: Home / Self Care | Payer: Self-pay | Source: Home / Self Care | Attending: Internal Medicine

## 2011-08-07 DIAGNOSIS — I2510 Atherosclerotic heart disease of native coronary artery without angina pectoris: Secondary | ICD-10-CM

## 2011-08-07 HISTORY — PX: LEFT HEART CATHETERIZATION WITH CORONARY ANGIOGRAM: SHX5451

## 2011-08-07 LAB — BASIC METABOLIC PANEL
BUN: 17 mg/dL (ref 6–23)
CO2: 24 mEq/L (ref 19–32)
Calcium: 8.9 mg/dL (ref 8.4–10.5)
Creatinine, Ser: 0.84 mg/dL (ref 0.50–1.35)
GFR calc non Af Amer: >90 mL/min (ref >90)
Glucose, Bld: 99 mg/dL (ref 70–99)

## 2011-08-07 LAB — CBC
Hemoglobin: 14.4 g/dL (ref 13.0–17.0)
MCH: 34.1 pg — ABNORMAL HIGH (ref 26.0–34.0)
MCHC: 35.5 g/dL (ref 30.0–36.0)
MCV: 96.2 fL (ref 78.0–100.0)
RBC: 4.22 MIL/uL (ref 4.22–5.81)

## 2011-08-07 LAB — LIPID PANEL
HDL: 37 mg/dL — ABNORMAL LOW (ref >39)
LDL Cholesterol: 127 mg/dL — ABNORMAL HIGH (ref 0–99)
Total CHOL/HDL Ratio: 4.9 RATIO
Triglycerides: 78 mg/dL (ref <150)
VLDL: 16 mg/dL (ref 0–40)

## 2011-08-07 LAB — CARDIAC PANEL(CRET KIN+CKTOT+MB+TROPI)
Relative Index: INVALID (ref 0.0–2.5)
Total CK: 30 U/L (ref 7–232)

## 2011-08-07 SURGERY — LEFT HEART CATHETERIZATION WITH CORONARY ANGIOGRAM
Anesthesia: LOCAL

## 2011-08-07 SURGERY — LEFT HEART CATHETERIZATION WITH CORONARY ANGIOGRAM

## 2011-08-07 MED ORDER — METOPROLOL SUCCINATE ER 25 MG PO TB24: 25.0000 mg | ORAL_TABLET | Freq: Every day | ORAL | Status: DC

## 2011-08-07 MED ORDER — LIDOCAINE HCL (PF) 1 % IJ SOLN
INTRAMUSCULAR | Status: AC
  Filled 2011-08-07: qty 30

## 2011-08-07 MED ORDER — NITROGLYCERIN 0.2 MG/ML ON CALL CATH LAB
INTRAVENOUS | Status: AC
  Filled 2011-08-07: qty 1

## 2011-08-07 MED ORDER — MIDAZOLAM HCL 2 MG/2ML IJ SOLN
INTRAMUSCULAR | Status: AC
  Filled 2011-08-07: qty 2

## 2011-08-07 MED ORDER — HEPARIN SODIUM (PORCINE) 1000 UNIT/ML IJ SOLN
INTRAMUSCULAR | Status: AC
  Filled 2011-08-07: qty 1

## 2011-08-07 MED ORDER — NITROGLYCERIN 0.4 MG SL SUBL: 0.4000 mg | SUBLINGUAL_TABLET | SUBLINGUAL | Status: DC | PRN

## 2011-08-07 MED ORDER — VERAPAMIL HCL 2.5 MG/ML IV SOLN
INTRAVENOUS | Status: AC
  Filled 2011-08-07: qty 2

## 2011-08-07 MED ORDER — FENTANYL CITRATE 0.05 MG/ML IJ SOLN
INTRAMUSCULAR | Status: AC
  Filled 2011-08-07: qty 2

## 2011-08-07 MED ORDER — SODIUM CHLORIDE 0.9 % IV SOLN: INTRAVENOUS | Status: AC

## 2011-08-07 MED ORDER — HEPARIN (PORCINE) IN NACL 2-0.9 UNIT/ML-% IJ SOLN
INTRAMUSCULAR | Status: AC
  Filled 2011-08-07: qty 1000

## 2011-08-07 NOTE — Discharge Instructions (Signed)
Do NOT use Nitroglycerin within 24 hours of Cialis.  PLEASE REMEMBER TO BRING ALL OF YOUR MEDICATIONS TO EACH OF YOUR FOLLOW-UP OFFICE VISITS.  PLEASE ATTEND ALL SCHEDULED FOLLOW-UP APPOINTMENTS.   Activity: Increase activity slowly as tolerated. You may shower, but no soaking baths (or swimming) for 1 week. No driving for 2 days. No lifting over 5 lbs for 1 week. No sexual activity for 1 week.   You May Return to Work: in 1 week (if applicable)  Wound Care: You may wash cath site gently with soap and water. Keep cath site clean and dry. If you notice pain, swelling, bleeding or pus at your cath site, please call [phone].    Groin Site Care Refer to this sheet in the next few weeks. These instructions provide you with information on caring for yourself after your procedure. Your caregiver may also give you more specific instructions. Your treatment has been planned according to current medical practices, but problems sometimes occur. Call your caregiver if you have any problems or questions after your procedure. HOME CARE INSTRUCTIONS  You may shower 24 hours after the procedure. Remove the bandage (dressing) and gently wash the site with plain soap and water. Gently pat the site dry.   Do not apply powder or lotion to the site.   Do not sit in a bathtub, swimming pool, or whirlpool for 5 to 7 days.   No bending, squatting, or lifting anything over 10 pounds (4.5 kg) as directed by your caregiver.   Inspect the site at least twice daily.   Do not drive home if you are discharged the same day of the procedure. Have someone else drive you.   You may drive 24 hours after the procedure unless otherwise instructed by your caregiver.  What to expect:  Any bruising will usually fade within 1 to 2 weeks.   Blood that collects in the tissue (hematoma) may be painful to the touch. It should usually decrease in size and tenderness within 1 to 2 weeks.  SEEK IMMEDIATE MEDICAL CARE IF:  You  have unusual pain at the groin site or down the affected leg.   You have redness, warmth, swelling, or pain at the groin site.   You have drainage (other than a small amount of blood on the dressing).   You have chills.   You have a fever or persistent symptoms for more than 72 hours.   You have a fever and your symptoms suddenly get worse.   Your leg becomes pale, cool, tingly, or numb.   You have heavy bleeding from the site. Hold pressure on the site.  Document Released: 03/15/2010 Document Revised: 01/30/2011 Document Reviewed: 03/15/2010 [facility] Patient Information 2012 [place], [place].

## 2011-08-07 NOTE — CV Procedure (Signed)
    Cardiac Catheterization Operative Report  Tommy Santiago [id number] 6/13/20139:20 AM Aura Dials, MD  Procedure Performed:  1. Left Heart Catheterization 2. Selective Coronary Angiography 3. Left ventricular angiogram  Operator: Verne Carrow, MD  Arterial access site:  Right radial artery.   Indication:  Known CAD, admitted with arm numbness, chest discomfort. Cardiac markers negative. EKG unchanged.                                     Procedure Details: The risks, benefits, complications, treatment options, and expected outcomes were discussed with the patient. The patient and/or family concurred with the proposed plan, giving informed consent. The patient was brought to the cath lab after IV hydration was begun and oral premedication was given. The patient was further sedated with Versed and Fentanyl. The right wrist was assessed with an Allens test which was positive. The right wrist was prepped and draped in a sterile fashion. 1% lidocaine was used for local anesthesia. Using the modified Seldinger access technique, a 5 French sheath was placed in the right radial artery. 3 mg Verapamil was given through the sheath. 5000 units IV heparin was given. Standard diagnostic catheters were used to perform selective coronary angiography. A pigtail catheter was used to perform a left ventricular angiogram. The sheath was removed from the right radial artery and a Terumo hemostasis band was applied at the arteriotomy site on the right wrist.   There were no immediate complications. The patient was taken to the recovery area in stable condition.   Hemodynamic Findings: Central aortic pressure: 91/55 Left ventricular pressure: 100/8/11  Angiographic Findings:  Left main:  No obstructive disease.   Left Anterior Descending Artery: Large caliber vessel that courses to the apex. Patent stent mid LAD without restenosis. There is a moderate sized diagonal branch that is jailed by  the stent with 70% ostial narrowing secondary to this. This appears the same as the final angiography after his stent placement in 2010. There is excellent flow down the diagonal branch.   Circumflex Artery: Large caliber vessel that gives off 2 marginal branches. There is 30% stenosis in the mid Circumflex between the takeoffs of the marginal branches. This not flow limiting.   Right Coronary Artery: Large, dominant vessel with smooth, 20% stenosis in the proximal and mid vesssel. No flow limiting lesions noted.   Left Ventricular Angiogram: LVEF 60%.   Impression: 1. Stable single vessel CAD with patent stent mid LAD with moderate stenosis in ostium of Diagonal branch secondary to being "jailed" by the stent.  2. Preserved LV systolic function  Recommendations: Continue medical management.        Complications:  None. The patient tolerated the procedure well.

## 2011-08-07 NOTE — Discharge Summary (Signed)
CARDIOLOGY DISCHARGE SUMMARY   Patient ID: Tommy Santiago MRN: [id number] DOB/AGE: Mar 23, 1945 66 y.o.  Admit date: 08/06/2011 Discharge date: 08/07/2011  Primary Discharge Diagnosis:  Anginal chest pain, medical therapy recommended Secondary Discharge Diagnosis:  Past Medical History  Diagnosis Date  . Coronary atherosclerosis of unspecified type of vessel, native or graft     status post cardiac cath w percutaneous coronary intervention using XIENCE drug-eluting stent to mid left anterior descending 07/23/08  . Paroxysmal supraventricular tachycardia   . Other specified cardiac dysrhythmias   . Syncope and collapse   . Other and unspecified hyperlipidemia   . Unspecified chronic bronchitis   . Hypopotassemia     history of hepatitis C   . Lichen planus   . Plantar fascial fibromatosis   . Personal history of other infectious and parasitic disease    Procedure Performed:  1. Left Heart Catheterization 2. Selective Coronary Angiography 3. Left ventricular angiogram  Hospital Course: Tommy Santiago is a 66 year old male with a history of coronary artery disease. He had chest pain associated with pallor and diaphoresis. He had previously been set up for a stress echo but had recurrent chest pain so he came to the emergency room. He was admitted for further evaluation and treatment.  His cardiac enzymes were negative for MI. His ECG did not show any acute ischemic changes. Cardiac catheterization was recommended and he was taken to the cath lab on 08/07/2011 with the results described below. Medical therapy was recommended. He had a beta blocker added to his medication regimen but had significant bradycardia with heart rates dropping into the 40s. He will not be discharged on this. He was continued on his other home meds. He is not on a statin because of a history of abnormal LFTs with a statin use.  Post-cath, and he is ambulating without chest pain or shortness of breath and  considered stable for discharge, to follow up as an outpatient.  Labs:  Lab Results  Component Value Date   WBC 7.6 08/07/2011   HGB 14.4 08/07/2011   HCT 40.6 08/07/2011   MCV 96.2 08/07/2011   PLT 188 08/07/2011    Lab 08/07/11 0401 08/06/11 1615  NA 134* --  K 3.5 --  CL 100 --  CO2 24 --  BUN 17 --  CREATININE 0.84 --  CALCIUM 8.9 --  PROT -- 7.2  BILITOT -- 0.5  ALKPHOS -- 71  ALT -- 56*  AST -- 36  GLUCOSE 99 --    Basename 08/07/11 0401 08/06/11 2234 08/06/11 1615  CKTOTAL 30 34 --  CKMB 2.3 2.4 --  CKMBINDEX -- -- --  TROPONINI <0.30 <0.30 <0.30   Lipid Panel     Component Value Date/Time   CHOL 180 08/07/2011 0401   TRIG 78 08/07/2011 0401   HDL 37* 08/07/2011 0401   CHOLHDL 4.9 08/07/2011 0401   VLDL 16 08/07/2011 0401   LDLCALC 127* 08/07/2011 0401    Basename 08/07/11 0401  INR 1.27      Radiology: Dg Chest Port 1 View 08/06/2011  *RADIOLOGY REPORT*  Clinical Data: Chest pressure.  History of smoking.  PORTABLE CHEST - 1 VIEW  Comparison: Chest x-ray 06/23/2008.  Findings: Lung volumes are normal.  No consolidative airspace disease.  No pleural effusions.  Linear opacities in the lung bases bilaterally are most compatible with areas of subsegmental atelectasis or scarring.  Pulmonary vasculature is normal.  Heart size is within normal limits.  Mediastinal contours are unremarkable.  Atherosclerotic calcifications within the arch of the aorta.  IMPRESSION: 1.  Minimal bibasilar subsegmental atelectasis versus scarring. 2.  Atherosclerosis.  Original Report Authenticated By: Florencia Reasons, M.D.    Cardiac Cath: 08/07/2011 Left main: No obstructive disease.  Left Anterior Descending Artery: Large caliber vessel that courses to the apex. Patent stent mid LAD without restenosis. There is a moderate sized diagonal branch that is jailed by the stent with 70% ostial narrowing secondary to this. This appears the same as the final angiography after his stent placement  in 2010. There is excellent flow down the diagonal branch.  Circumflex Artery: Large caliber vessel that gives off 2 marginal branches. There is 30% stenosis in the mid Circumflex between the takeoffs of the marginal branches. This not flow limiting.  Right Coronary Artery: Large, dominant vessel with smooth, 20% stenosis in the proximal and mid vesssel. No flow limiting lesions noted.  Left Ventricular Angiogram: LVEF 60%.   EKG: 07-Aug-2011 03:27:43  Sinus bradycardia Otherwise normal ECG 60mm/s 41mm/mV 100Hz  8.0.1 12SL 241 HD CID: 1 Referred by: MD DANIEL BENSIMHON Unconfirmed Vent. rate 47 BPM PR interval 178 ms QRS duration 88 ms QT/QTc 456/403 ms P-R-T axes 53 45 50  FOLLOW UP PLANS AND APPOINTMENTS No Known Allergies  Medication List  As of 08/07/2011 12:59 PM   STOP taking these medications         b complex-C-E-zinc tablet         TAKE these medications         aspirin 325 MG tablet   Take 325 mg by mouth daily.      buPROPion 300 MG 24 hr tablet   Commonly known as: WELLBUTRIN XL   Take 300 mg by mouth daily.      CALCIUM 600/VITAMIN D 600-400 MG-UNIT per tablet   Generic drug: Calcium Carbonate-Vitamin D   Take 1 tablet by mouth 2 (two) times daily.      CIALIS 20 MG tablet   Generic drug: tadalafil   Take 20 mg by mouth daily as needed. Patient uses this medication for ED.      DIOVAN 160 MG tablet   Generic drug: valsartan   Take 160 mg by mouth daily.      Fish Oil 1000 MG Caps   Take by mouth daily. 2 caps      hydrochlorothiazide 25 MG tablet   Commonly known as: HYDRODIURIL   Take 25 mg by mouth daily.      MAG-OX 400 PO   Take 400 mg by mouth daily.      nitroGLYCERIN 0.4 MG SL tablet   Commonly known as: NITROSTAT   Place 1 tablet (0.4 mg total) under the tongue every 5 (five) minutes as needed for chest pain.      omeprazole 20 MG capsule   Commonly known as: PRILOSEC   Take 20 mg by mouth as needed. Patient uses this medication for  acid reflux.      psyllium 58.6 % powder   Commonly known as: METAMUCIL   Take 1 packet by mouth daily. Patient uses this mediation for regularity.      TYLENOL PM EXTRA STRENGTH 25-500 MG Tabs   Generic drug: diphenhydramine-acetaminophen   Take 1 tablet by mouth as needed. Use as directed.  Patient uses this medication for pain.      vitamin C 500 MG tablet   Commonly known as: ASCORBIC ACID   Take 500 mg by mouth daily.  Vitamin D3 2000 UNITS capsule   Take 2,000 Units by mouth daily.      vitamin E 400 UNIT capsule   Take 400 Units by mouth daily.      Zinc 50 MG Tabs   Take by mouth daily. 1 cap              Future Orders Please Complete By Expires   Diet - low sodium heart healthy      Increase activity slowly      Call MD for:  redness, tenderness, or signs of infection (pain, swelling, redness, odor or green/yellow discharge around incision site)        Follow-up Information    Follow up with Tereso Newcomer, PA-C for [facility], MD. June 26 at 10:10 am   Contact information:   [facility] [address] [place] [address] [phone]          BRING ALL MEDICATIONS WITH YOU TO FOLLOW UP APPOINTMENTS  Time spent with patient to include physician time: 38 min Signed: Theodore Demark 08/07/2011, 11:45 AM Co-Sign MD

## 2011-08-07 NOTE — Progress Notes (Signed)
Pt/wife given discharge instructions, medication lists, s/sx infection, when to call the doctor and follow up appointments.  Pt verbalized understanding of instructions. Thomas Hoff

## 2011-08-07 NOTE — Interval H&P Note (Signed)
History and Physical Interval Note:  08/07/2011 8:36 AM  [name]  has presented today for surgery, with the diagnosis of unstable angina  The various methods of treatment have been discussed with the patient and family. After consideration of risks, benefits and other options for treatment, the patient has consented to  Procedure(s) (LRB): LEFT HEART CATH (N/A) as a surgical intervention .  The patients' history has been reviewed, patient examined, no change in status, stable for surgery.  I have reviewed the patients' chart and labs.  Questions were answered to the patient's satisfaction.     Tommy Santiago

## 2011-08-08 NOTE — Discharge Summary (Signed)
See full note yesterday am. cdm

## 2011-08-12 ENCOUNTER — Other Ambulatory Visit (HOSPITAL_COMMUNITY): Payer: 59

## 2011-08-19 ENCOUNTER — Ambulatory Visit (INDEPENDENT_AMBULATORY_CARE_PROVIDER_SITE_OTHER): Payer: 59 | Admitting: Physician Assistant

## 2011-08-19 ENCOUNTER — Encounter: Payer: Self-pay | Admitting: Physician Assistant

## 2011-08-19 VITALS — BP 110/80 | HR 65 | Ht 71.0 in | Wt 213.0 lb

## 2011-08-19 DIAGNOSIS — R079 Chest pain, unspecified: Secondary | ICD-10-CM

## 2011-08-19 DIAGNOSIS — E785 Hyperlipidemia, unspecified: Secondary | ICD-10-CM

## 2011-08-19 DIAGNOSIS — I2510 Atherosclerotic heart disease of native coronary artery without angina pectoris: Secondary | ICD-10-CM

## 2011-08-19 NOTE — Progress Notes (Signed)
[address] [place], [place]  [address] Phone: [phone] Fax:  [phone]  Date:  08/19/2011   Name:  Tommy Santiago   DOB:  07-31-1945   MRN:  [id number]  PCP:  Aura Dials, MD  Primary Cardiologist:  Dr. Verne Carrow  Primary Electrophysiologist:  None    History of Present Illness: Tommy Santiago is a 66 y.o. male who returns for post hospital follow up.  He has a history of CAD s/p placement Xience DES mid LAD 06/23/08, HTN, hyperlipidemia, former tobacco abuse and hepatitis C.  He has been on a statin in the past but this was stopped because of his GI disease.  Seen in follow up by Dr. [name] 07/2011 and noted chest pain.   ETT-echo was arranged.  However, the patient presented to the hospital on 6/12 with recurrent chest pain.  He rule out for myocardial infarction.  LHC 08/07/11: Mid LAD stent patent, moderate size diagonal branch jailed by stent with 70% ostial stenosis, unchanged from 2010 with excellent flow down the diagonal branch, mid circumflex 30%, proximal and mid RCA 20%, EF 60%.  Medical therapy continued.  Doing well.  The patient denies chest pain, shortness of breath, syncope, orthopnea, PND or significant pedal edema.  Thinks his chest pain was from stress.  Also, now taking Prilosec daily.  He plans to see his GI (Dr. Kinnie Scales) soon.     Wt Readings from Last 3 Encounters:  08/19/11 213 lb (96.616 kg)  08/06/11 214 lb 4.6 oz (97.2 kg)  08/06/11 214 lb 4.6 oz (97.2 kg)     Potassium  Date/Time Value Range Status  08/07/2011  4:01 AM 3.5  3.5 - 5.1 mEq/L Final     Creatinine, Ser  Date/Time Value Range Status  08/07/2011  4:01 AM 0.84  0.50 - 1.35 mg/dL Final     ALT  Date/Time Value Range Status  08/06/2011  4:15 PM 56* 0 - 53 U/L Final     TSH  Date/Time Value Range Status  08/06/2011 10:34 PM 1.440  0.350 - 4.500 uIU/mL Final     Hemoglobin  Date/Time Value Range Status  08/07/2011  4:01 AM 14.4  13.0 - 17.0  g/dL Final    Past Medical History  Diagnosis Date  . Coronary atherosclerosis of unspecified type of vessel, native or graft     status post cardiac cath w percutaneous coronary intervention using XIENCE drug-eluting stent to mid left anterior descending 07/23/08; LHC 08/07/11: Mid LAD stent patent, moderate size diagonal branch jailed by stent with 70% ostial stenosis, unchanged from 2010 with excellent flow down the diagonal branch, mid circumflex 30%, proximal and mid RCA 20%, EF 60%.  Medical therapy continued.   . Paroxysmal supraventricular tachycardia   . Other specified cardiac dysrhythmias   . Syncope and collapse   . Other and unspecified hyperlipidemia   . Unspecified chronic bronchitis   . Hypopotassemia   . Lichen planus   . Plantar fascial fibromatosis   . Personal history of other infectious and parasitic disease     Current Outpatient Prescriptions  Medication Sig Dispense Refill  . aspirin 325 MG tablet Take 325 mg by mouth daily.      Marland Kitchen buPROPion (WELLBUTRIN XL) 300 MG 24 hr tablet Take 300 mg by mouth daily.        . Calcium Carbonate-Vitamin D (CALCIUM 600/VITAMIN D) 600-400 MG-UNIT per tablet Take 1 tablet by mouth 2 (two) times daily.        Marland Kitchen  Cholecalciferol (VITAMIN D3) 2000 UNITS capsule Take 2,000 Units by mouth daily.        . diphenhydramine-acetaminophen (TYLENOL PM EXTRA STRENGTH) 25-500 MG TABS Take 1 tablet by mouth as needed. Use as directed.  Patient uses this medication for pain.      . hydrochlorothiazide (HYDRODIURIL) 25 MG tablet Take 25 mg by mouth daily.      . Magnesium Oxide (MAG-OX 400 PO) Take 400 mg by mouth daily.      . nitroGLYCERIN (NITROSTAT) 0.4 MG SL tablet Place 1 tablet (0.4 mg total) under the tongue every 5 (five) minutes as needed for chest pain.  25 tablet  3  . Omega-3 Fatty Acids (FISH OIL) 1000 MG CAPS Take by mouth daily. 2 caps       . omeprazole (PRILOSEC) 20 MG capsule Take 20 mg by mouth as needed. Patient uses this  medication for acid reflux.      . psyllium (METAMUCIL) 58.6 % powder Take 1 packet by mouth daily. Patient uses this mediation for regularity.      . tadalafil (CIALIS) 20 MG tablet Take 20 mg by mouth daily as needed. Patient uses this medication for ED.      . valsartan (DIOVAN) 160 MG tablet Take 160 mg by mouth daily.        . vitamin C (ASCORBIC ACID) 500 MG tablet Take 500 mg by mouth daily.        . vitamin E 400 UNIT capsule Take 400 Units by mouth daily.      . Zinc 50 MG TABS Take by mouth daily. 1 cap         Allergies: No Known Allergies  History  Substance Use Topics  . Smoking status: Former Games developer  . Smokeless tobacco: Not on file   Comment: greater than 40 pack per year...quit 1989  . Alcohol Use: Yes     quit 25 years ago      PHYSICAL EXAM: VS:  BP 110/80  Pulse 65  Ht 5\' 11"  (1.803 m)  Wt 213 lb (96.616 kg)  BMI 29.71 kg/m2 Well nourished, well developed, in no acute distress HEENT: normal Neck: no JVD Cardiac:  normal S1, S2; RRR; no murmur Lungs:  clear to auscultation bilaterally, no wheezing, rhonchi or rales Abd: soft, nontender, no hepatomegaly Ext: no edema; right wrist without hematoma or bruit  Skin: warm and dry Neuro:  CNs 2-12 intact, no focal abnormalities noted  EKG:  Sinus rhythm, rate 65, normal axis, PVCs, no change from prior tracing   ASSESSMENT AND PLAN:  1.  Coronary artery disease Doing well post catheterization.  Continue aspirin.  Followup with Dr. Verne Carrow in 1 year.  2.  Chest pain Noncardiac. I encouraged him to continue his PPI until he sees his gastroenterologist back in followup.  3.  Hyperlipidemia He has been unable to take statins in the past due to GI disease.   Signed, Tereso Newcomer, PA-C  10:36 AM 08/19/2011

## 2011-08-19 NOTE — Patient Instructions (Addendum)
Your physician wants you to follow-up in: 1 YEAR WITH DR. [name]. You will receive a reminder letter in the mail two months in advance. If you don't receive a letter, please call our office to schedule the follow-up appointment.   NO CHANGES WERE MADE TODAY

## 2011-10-21 ENCOUNTER — Other Ambulatory Visit: Payer: Self-pay | Admitting: Gastroenterology

## 2011-10-21 DIAGNOSIS — B1920 Unspecified viral hepatitis C without hepatic coma: Secondary | ICD-10-CM

## 2011-10-21 DIAGNOSIS — C220 Liver cell carcinoma: Secondary | ICD-10-CM

## 2011-10-29 ENCOUNTER — Other Ambulatory Visit: Payer: 59

## 2011-11-05 ENCOUNTER — Other Ambulatory Visit (HOSPITAL_BASED_OUTPATIENT_CLINIC_OR_DEPARTMENT_OTHER): Payer: Self-pay | Admitting: Family Medicine

## 2011-11-05 ENCOUNTER — Ambulatory Visit (HOSPITAL_BASED_OUTPATIENT_CLINIC_OR_DEPARTMENT_OTHER)
Admission: RE | Admit: 2011-11-05 | Discharge: 2011-11-05 | Disposition: A | Discharge status: Home / Self Care | Payer: 59 | Source: Ambulatory Visit | Attending: Family Medicine | Admitting: Family Medicine

## 2011-11-05 DIAGNOSIS — R29898 Other symptoms and signs involving the musculoskeletal system: Secondary | ICD-10-CM

## 2011-11-05 DIAGNOSIS — M5030 Other cervical disc degeneration, unspecified cervical region: Secondary | ICD-10-CM | POA: Insufficient documentation

## 2011-11-06 DIAGNOSIS — F32A Depression, unspecified: Secondary | ICD-10-CM | POA: Insufficient documentation

## 2011-11-06 DIAGNOSIS — M8580 Other specified disorders of bone density and structure, unspecified site: Secondary | ICD-10-CM | POA: Insufficient documentation

## 2011-11-06 DIAGNOSIS — N529 Male erectile dysfunction, unspecified: Secondary | ICD-10-CM | POA: Insufficient documentation

## 2011-11-06 DIAGNOSIS — F329 Major depressive disorder, single episode, unspecified: Secondary | ICD-10-CM | POA: Insufficient documentation

## 2011-11-25 ENCOUNTER — Ambulatory Visit (HOSPITAL_BASED_OUTPATIENT_CLINIC_OR_DEPARTMENT_OTHER)
Admission: RE | Admit: 2011-11-25 | Discharge: 2011-11-25 | Disposition: A | Discharge status: Home / Self Care | Payer: 59 | Source: Ambulatory Visit | Attending: Gastroenterology | Admitting: Gastroenterology

## 2011-11-25 DIAGNOSIS — B1920 Unspecified viral hepatitis C without hepatic coma: Secondary | ICD-10-CM | POA: Insufficient documentation

## 2011-11-25 DIAGNOSIS — K7460 Unspecified cirrhosis of liver: Secondary | ICD-10-CM | POA: Insufficient documentation

## 2011-11-25 DIAGNOSIS — C220 Liver cell carcinoma: Secondary | ICD-10-CM

## 2011-11-25 MED ORDER — GADOBENATE DIMEGLUMINE 529 MG/ML IV SOLN: 20.0000 mL | Freq: Once | INTRAVENOUS | Status: AC | PRN

## 2012-02-11 ENCOUNTER — Encounter: Payer: Self-pay | Admitting: Cardiovascular Disease

## 2012-02-11 ENCOUNTER — Ambulatory Visit (INDEPENDENT_AMBULATORY_CARE_PROVIDER_SITE_OTHER): Payer: 59 | Admitting: Cardiovascular Disease

## 2012-02-11 VITALS — BP 118/68 | HR 65 | Wt 212.0 lb

## 2012-02-11 DIAGNOSIS — I2510 Atherosclerotic heart disease of native coronary artery without angina pectoris: Secondary | ICD-10-CM

## 2012-02-11 NOTE — Patient Instructions (Addendum)
Your physician wants you to follow-up in:  12 months.  You will receive a reminder letter in the mail two months in advance. If you don't receive a letter, please call our office to schedule the follow-up appointment.   

## 2012-02-11 NOTE — Progress Notes (Signed)
History of Present Illness: 66 yo WM with history of CAD s/p placement Xience DES mid LAD 06/23/08, HTN, hyperlipidemia, former tobacco abuse and hepatitis C here for scheduled follow up. He has been on a statin in the past but this was stopped because of his GI disease. He was seen in June 2013 and had c/o chest pain and was admitted for cath. Cardiac cath with stable disease, patent LAD stent. His chest pain was felt to be GI related.   He is here today for follow up. He has been working full time. He has been taking all of his medications as prescribed. He does not tolerate statins. No chest pain since.   Primary Care Physician: [name]   Last Lipid Profile:Lipid Panel     Component Value Date/Time   CHOL 180 08/07/2011 0401   TRIG 78 08/07/2011 0401   HDL 37* 08/07/2011 0401   CHOLHDL 4.9 08/07/2011 0401   VLDL 16 08/07/2011 0401   LDLCALC 127* 08/07/2011 0401     Past Medical History  Diagnosis Date  . Coronary atherosclerosis of unspecified type of vessel, native or graft     status post cardiac cath w percutaneous coronary intervention using XIENCE drug-eluting stent to mid left anterior descending 07/23/08; LHC 08/07/11: Mid LAD stent patent, moderate size diagonal branch jailed by stent with 70% ostial stenosis, unchanged from 2010 with excellent flow down the diagonal branch, mid circumflex 30%, proximal and mid RCA 20%, EF 60%.  Medical therapy continued.   . Paroxysmal supraventricular tachycardia   . Other specified cardiac dysrhythmias   . Syncope and collapse   . Other and unspecified hyperlipidemia   . Unspecified chronic bronchitis   . Hypopotassemia   . Lichen planus   . Plantar fascial fibromatosis   . Personal history of other infectious and parasitic disease     Past Surgical History  Procedure Date  . Cholecystectomy   . Knee arthroscopy   . Esophagogastroduodenoscopy     status post  . Colonscopy     status post  . Coronary angioplasty with stent  placement     Current Outpatient Prescriptions  Medication Sig Dispense Refill  . aspirin 325 MG tablet Take 325 mg by mouth daily.      . B Complex Vitamins (VITAMIN B COMPLEX PO) Take 1 tablet by mouth daily.      Marland Kitchen buPROPion (WELLBUTRIN XL) 300 MG 24 hr tablet Take 300 mg by mouth daily.        . Calcium Carbonate-Vitamin D (CALCIUM 600/VITAMIN D) 600-400 MG-UNIT per tablet Take 1 tablet by mouth 2 (two) times daily.        . Cholecalciferol (VITAMIN D3) 2000 UNITS capsule Take 2,000 Units by mouth daily.        . diphenhydramine-acetaminophen (TYLENOL PM EXTRA STRENGTH) 25-500 MG TABS Take 1 tablet by mouth as needed. Use as directed.  Patient uses this medication for pain.      . hydrochlorothiazide (HYDRODIURIL) 25 MG tablet Take 25 mg by mouth daily.      . Magnesium Oxide (MAG-OX 400 PO) Take 400 mg by mouth daily.      . nitroGLYCERIN (NITROSTAT) 0.4 MG SL tablet Place 1 tablet (0.4 mg total) under the tongue every 5 (five) minutes as needed for chest pain.  25 tablet  3  . Omega-3 Fatty Acids (FISH OIL) 1000 MG CAPS Take by mouth daily. 2 caps       . omeprazole (PRILOSEC) 20 MG  capsule Take 20 mg by mouth as needed. Patient uses this medication for acid reflux.      . psyllium (METAMUCIL) 58.6 % powder Take 1 packet by mouth daily. Patient uses this mediation for regularity.      . tadalafil (CIALIS) 20 MG tablet Take 20 mg by mouth daily as needed. Patient uses this medication for ED.      . valsartan (DIOVAN) 160 MG tablet Take 160 mg by mouth daily.        . vitamin C (ASCORBIC ACID) 500 MG tablet Take 500 mg by mouth daily.        . vitamin E 400 UNIT capsule Take 400 Units by mouth daily.      . Zinc 50 MG TABS Take by mouth daily. 1 cap         No Known Allergies  History   Social History  . Marital Status: Married    Spouse Name: N/A    Number of Children: N/A  . Years of Education: N/A   Occupational History  . Not on file.   Social History Main Topics  .  Smoking status: Former Games developer  . Smokeless tobacco: Not on file     Comment: greater than 40 pack per year...quit 1989  . Alcohol Use: Yes     Comment: quit 25 years ago   . Drug Use: Yes     Comment: quit 25 years ago  . Sexually Active:    Other Topics Concern  . Not on file   Social History Narrative  . No narrative on file    Family History  Problem Relation Age of Onset  . Coronary artery disease Brother     diagnosed in early 74s  . Coronary artery disease Other     hx family    Review of Systems:  As stated in the HPI and otherwise negative.   BP 118/68  Pulse 65  Wt 212 lb (96.163 kg)  Physical Examination: General: Well developed, well nourished, NAD HEENT: OP clear, mucus membranes moist SKIN: warm, dry. No rashes. Neuro: No focal deficits Musculoskeletal: Muscle strength 5/5 all ext Psychiatric: Mood and affect normal Neck: No JVD, no carotid bruits, no thyromegaly, no lymphadenopathy. Lungs:Clear bilaterally, no wheezes, rhonci, crackles Cardiovascular: Regular rate and rhythm. No murmurs, gallops or rubs. Abdomen:Soft. Bowel sounds present. Non-tender.  Extremities: No lower extremity edema. Pulses are 2 + in the bilateral DP/PT.  Cardiac cath 08/07/11:  Left main: No obstructive disease.  Left Anterior Descending Artery: Large caliber vessel that courses to the apex. Patent stent mid LAD without restenosis. There is a moderate sized diagonal branch that is jailed by the stent with 70% ostial narrowing secondary to this. This appears the same as the final angiography after his stent placement in 2010. There is excellent flow down the diagonal branch.  Circumflex Artery: Large caliber vessel that gives off 2 marginal branches. There is 30% stenosis in the mid Circumflex between the takeoffs of the marginal branches. This not flow limiting.  Right Coronary Artery: Large, dominant vessel with smooth, 20% stenosis in the proximal and mid vesssel. No flow  limiting lesions noted.  Left Ventricular Angiogram: LVEF 60%.   Assessment and Plan:   1. CAD, NATIVE VESSEL: Stable disease by cath June 2013. He is not on an Ace-inhibitor because of cough. No beta blocker secondary to intolerance in past with bradycardia. No statin secondary to liver disease. Will continue ASA and ARB.

## 2012-04-10 ENCOUNTER — Other Ambulatory Visit: Payer: Self-pay

## 2012-07-20 DIAGNOSIS — B1920 Unspecified viral hepatitis C without hepatic coma: Secondary | ICD-10-CM | POA: Insufficient documentation

## 2012-07-20 DIAGNOSIS — Z87891 Personal history of nicotine dependence: Secondary | ICD-10-CM | POA: Insufficient documentation

## 2012-07-20 DIAGNOSIS — Z8619 Personal history of other infectious and parasitic diseases: Secondary | ICD-10-CM | POA: Insufficient documentation

## 2012-09-29 ENCOUNTER — Other Ambulatory Visit: Payer: Self-pay

## 2012-12-30 ENCOUNTER — Other Ambulatory Visit: Payer: Self-pay

## 2013-07-28 ENCOUNTER — Encounter: Payer: Self-pay | Admitting: Cardiovascular Disease

## 2013-07-29 ENCOUNTER — Encounter: Payer: Self-pay | Admitting: Cardiovascular Disease

## 2013-07-29 ENCOUNTER — Ambulatory Visit (INDEPENDENT_AMBULATORY_CARE_PROVIDER_SITE_OTHER): Payer: 59 | Admitting: Cardiovascular Disease

## 2013-07-29 VITALS — BP 118/78 | HR 64 | Ht 71.0 in | Wt 222.0 lb

## 2013-07-29 DIAGNOSIS — I2510 Atherosclerotic heart disease of native coronary artery without angina pectoris: Secondary | ICD-10-CM

## 2013-07-29 NOTE — Progress Notes (Signed)
History of Present Illness: 68 yo WM with history of CAD s/p placement Xience DES mid LAD 06/23/08, HTN, hyperlipidemia, former tobacco abuse and hepatitis C here for scheduled follow up. He has been on a statin in the past but this was stopped because of his GI disease. He was seen in June 2013 and had c/o chest pain and was admitted for cath. Cardiac cath with stable disease, patent LAD stent. His chest pain was felt to be GI related.   He is here today for follow up. He has been working full time. He works in [employer]. One episode of chest pain one month ago while working in yard, resolved slowly with NTG. No recurrence.  He has been taking all of his medications as prescribed. He does not tolerate statins.   Primary Care Physician: Bernerd Limbo   Last Lipid Profile: Followed in primary care   Past Medical History  Diagnosis Date  . Coronary atherosclerosis of unspecified type of vessel, native or graft     status post cardiac cath w percutaneous coronary intervention using XIENCE drug-eluting stent to mid left anterior descending 07/23/08; LHC 08/07/11: Mid LAD stent patent, moderate size diagonal branch jailed by stent with 70% ostial stenosis, unchanged from 2010 with excellent flow down the diagonal branch, mid circumflex 30%, proximal and mid RCA 20%, EF 60%.  Medical therapy continued.   . Paroxysmal supraventricular tachycardia   . Other specified cardiac dysrhythmias(427.89)   . Syncope and collapse   . Other and unspecified hyperlipidemia   . Unspecified chronic bronchitis   . Hypopotassemia   . Lichen planus   . Plantar fascial fibromatosis   . Personal history of other infectious and parasitic disease     Past Surgical History  Procedure Laterality Date  . Cholecystectomy    . Knee arthroscopy    . Esophagogastroduodenoscopy      status post  . Colonscopy      status post  . Coronary angioplasty with stent placement      Current Outpatient Prescriptions    Medication Sig Dispense Refill  . aspirin 325 MG tablet Take 325 mg by mouth daily.      . B Complex Vitamins (VITAMIN B COMPLEX PO) Take 1 tablet by mouth daily.      Marland Kitchen buPROPion (WELLBUTRIN XL) 300 MG 24 hr tablet Take 300 mg by mouth daily.        . Calcium Carbonate-Vitamin D (CALCIUM 600/VITAMIN D) 600-400 MG-UNIT per tablet Take 1 tablet by mouth 2 (two) times daily.        . Cholecalciferol (VITAMIN D3) 2000 UNITS capsule Take 2,000 Units by mouth daily.        . diphenhydramine-acetaminophen (TYLENOL PM EXTRA STRENGTH) 25-500 MG TABS Take 1 tablet by mouth as needed. Use as directed.  Patient uses this medication for pain.      . hydrochlorothiazide (HYDRODIURIL) 25 MG tablet Take 25 mg by mouth daily.      . nitroGLYCERIN (NITROSTAT) 0.4 MG SL tablet Place 1 tablet (0.4 mg total) under the tongue every 5 (five) minutes as needed for chest pain.  25 tablet  3  . Omega-3 Fatty Acids (FISH OIL) 1000 MG CAPS Take by mouth daily. 2 caps       . omeprazole (PRILOSEC) 20 MG capsule Take 20 mg by mouth as needed. Patient uses this medication for acid reflux.      . psyllium (METAMUCIL) 58.6 % powder Take 1 packet by mouth  daily. Patient uses this mediation for regularity.      . tadalafil (CIALIS) 20 MG tablet Take 20 mg by mouth daily as needed. Patient uses this medication for ED.      . valsartan (DIOVAN) 160 MG tablet Take 160 mg by mouth daily.        . vitamin E 400 UNIT capsule Take 400 Units by mouth daily.       No current facility-administered medications for this visit.    No Known Allergies  History   Social History  . Marital Status: Married    Spouse Name: N/A    Number of Children: N/A  . Years of Education: N/A   Occupational History  . Not on file.   Social History Main Topics  . Smoking status: Former Research scientist (life sciences)  . Smokeless tobacco: Not on file     Comment: greater than 40 pack per year...quit 1989  . Alcohol Use: Yes     Comment: quit 25 years ago   . Drug  Use: Yes     Comment: quit 25 years ago  . Sexual Activity:    Other Topics Concern  . Not on file   Social History Narrative  . No narrative on file    Family History  Problem Relation Age of Onset  . Coronary artery disease Brother     diagnosed in early 31s  . Coronary artery disease Other     hx family    Review of Systems:  As stated in the HPI and otherwise negative.   BP 118/78  Pulse 64  Ht 5\' 11"  (1.803 m)  Wt 222 lb (100.699 kg)  BMI 30.98 kg/m2  Physical Examination: General: Well developed, well nourished, NAD HEENT: OP clear, mucus membranes moist SKIN: warm, dry. No rashes. Neuro: No focal deficits Musculoskeletal: Muscle strength 5/5 all ext Psychiatric: Mood and affect normal Neck: No JVD, no carotid bruits, no thyromegaly, no lymphadenopathy. Lungs:Clear bilaterally, no wheezes, rhonci, crackles Cardiovascular: Regular rate and rhythm. No murmurs, gallops or rubs. Abdomen:Soft. Bowel sounds present. Non-tender.  Extremities: No lower extremity edema. Pulses are 2 + in the bilateral DP/PT.  Cardiac cath 08/07/11:  Left main: No obstructive disease.  Left Anterior Descending Artery: Large caliber vessel that courses to the apex. Patent stent mid LAD without restenosis. There is a moderate sized diagonal branch that is jailed by the stent with 70% ostial narrowing secondary to this. This appears the same as the final angiography after his stent placement in 2010. There is excellent flow down the diagonal branch.  Circumflex Artery: Large caliber vessel that gives off 2 marginal branches. There is 30% stenosis in the mid Circumflex between the takeoffs of the marginal branches. This not flow limiting.  Right Coronary Artery: Large, dominant vessel with smooth, 20% stenosis in the proximal and mid vesssel. No flow limiting lesions noted.  Left Ventricular Angiogram: LVEF 60%.   Assessment and Plan:   1. CAD, NATIVE VESSEL: Stable disease by cath June 2013.  He is not on an Ace-inhibitor because of cough. No beta blocker secondary to intolerance in past with bradycardia. No statin secondary to liver disease. Will continue ASA and ARB.

## 2013-07-29 NOTE — Patient Instructions (Signed)
Your physician wants you to follow-up in:  12 months.  You will receive a reminder letter in the mail two months in advance. If you don't receive a letter, please call our office to schedule the follow-up appointment.   

## 2013-09-14 ENCOUNTER — Other Ambulatory Visit: Payer: Self-pay | Admitting: Gastroenterology

## 2013-09-14 DIAGNOSIS — B182 Chronic viral hepatitis C: Secondary | ICD-10-CM

## 2013-09-22 ENCOUNTER — Ambulatory Visit
Admission: RE | Admit: 2013-09-22 | Discharge: 2013-09-22 | Disposition: A | Discharge status: Home / Self Care | Payer: 59 | Source: Ambulatory Visit | Attending: Gastroenterology | Admitting: Gastroenterology

## 2013-09-22 DIAGNOSIS — B182 Chronic viral hepatitis C: Secondary | ICD-10-CM

## 2013-12-09 ENCOUNTER — Other Ambulatory Visit: Payer: Self-pay

## 2013-12-16 ENCOUNTER — Ambulatory Visit: Payer: 59 | Admitting: Internal Medicine

## 2014-02-02 ENCOUNTER — Encounter (HOSPITAL_COMMUNITY): Payer: Self-pay | Admitting: Cardiovascular Disease

## 2014-04-18 ENCOUNTER — Ambulatory Visit (INDEPENDENT_AMBULATORY_CARE_PROVIDER_SITE_OTHER): Payer: 59 | Admitting: Podiatry

## 2014-04-18 ENCOUNTER — Encounter: Payer: Self-pay | Admitting: Podiatry

## 2014-04-18 ENCOUNTER — Ambulatory Visit (INDEPENDENT_AMBULATORY_CARE_PROVIDER_SITE_OTHER): Payer: 59

## 2014-04-18 VITALS — BP 105/63 | HR 99 | Resp 16

## 2014-04-18 DIAGNOSIS — M779 Enthesopathy, unspecified: Secondary | ICD-10-CM

## 2014-04-18 DIAGNOSIS — L600 Ingrowing nail: Secondary | ICD-10-CM

## 2014-04-18 DIAGNOSIS — M79673 Pain in unspecified foot: Secondary | ICD-10-CM

## 2014-04-18 NOTE — Progress Notes (Signed)
   Subjective:    Patient ID: Tommy Santiago, male    DOB: [birth year], 69 y.o.   MRN: [id number]  HPI Comments: I need new orthotics"  Patient states that feet are still doing good, but he notices the cracks in the top cover of the orthotics and would like them replaced.      Review of Systems  All other systems reviewed and are negative.      Objective:   Physical Exam        Assessment & Plan:

## 2014-04-18 NOTE — Progress Notes (Signed)
Subjective:     Patient ID: Tommy Santiago, male   DOB: 1946/01/19, 69 y.o.   MRN: [id number]  HPI patient presents stating that his orthotics are cracked and he like a new pair and possibly have these remade. States he still gets pain in his feet and she's been on a long time especially as his orthotics are no longer is effective. Also complains about thick fourth and fifth nails of the right foot   Review of Systems     Objective:   Physical Exam Neurovascular status was intact with muscle strength adequate and range of motion within normal limits. Patient is noted to have mild tenderness symptoms and orthotics that have cracked in the more distal portion and have lost some of their arch support. Hammertoe deformity noted which is causing increased pressure against his toes causing nail disease and thickness fourth and fifth right over left    Assessment:     Tendinitis secondary to foot structure with orthotics which have been effective    Plan:     Reviewed condition and at this time I recommended new orthotics and he was scanned. I discussed supportive-type shoes and patient be seen back when the orthotics are returned. Patient also has fourth and fifth nails right that are thick and bothersome and we will remove these at next visit and I discussed this with him

## 2014-05-10 ENCOUNTER — Ambulatory Visit: Payer: 59 | Admitting: Podiatry

## 2014-05-11 ENCOUNTER — Encounter: Payer: Self-pay | Admitting: Podiatry

## 2014-05-11 ENCOUNTER — Ambulatory Visit (INDEPENDENT_AMBULATORY_CARE_PROVIDER_SITE_OTHER): Payer: 59 | Admitting: Podiatry

## 2014-05-11 VITALS — BP 150/97 | HR 72 | Resp 16

## 2014-05-11 DIAGNOSIS — M779 Enthesopathy, unspecified: Secondary | ICD-10-CM

## 2014-05-11 DIAGNOSIS — L600 Ingrowing nail: Secondary | ICD-10-CM

## 2014-05-11 NOTE — Patient Instructions (Signed)

## 2014-05-14 NOTE — Progress Notes (Signed)
Subjective:     Patient ID: Tommy Santiago, male   DOB: 07-Jun-1945, 69 y.o.   MRN: [id number]  HPI patient presents for removal of the fourth and fifth nails on his right foot stating they bother him and he cannot cut them himself.   Review of Systems     Objective:   Physical Exam Neurovascular status intact muscle strength was adequate with thick and fourth and fifth nails of the right foot which are painful when pressed and makes walking difficult. Patient's noted to have good digital perfusion    Assessment:     Damaged fourth and fifth nails right with pain    Plan:     H&P and conditions discussed with patient. I've recommended removal of the nails and I explained the procedure and risk and today I infiltrated with 120 mg Xylocaine Marcaine mixture remove the fourth and fifth nails exposed matrix and applied phenol 5 applications followed by alcohol lavaged and sterile dressing. Gave instructions on soaks

## 2014-07-04 ENCOUNTER — Other Ambulatory Visit: Payer: Self-pay | Admitting: Gastroenterology

## 2014-07-04 DIAGNOSIS — B1920 Unspecified viral hepatitis C without hepatic coma: Secondary | ICD-10-CM

## 2014-07-11 ENCOUNTER — Ambulatory Visit
Admission: RE | Admit: 2014-07-11 | Discharge: 2014-07-11 | Disposition: A | Discharge status: Home / Self Care | Payer: 59 | Source: Ambulatory Visit | Attending: Gastroenterology | Admitting: Gastroenterology

## 2014-07-11 DIAGNOSIS — B1920 Unspecified viral hepatitis C without hepatic coma: Secondary | ICD-10-CM

## 2014-07-12 DIAGNOSIS — M79673 Pain in unspecified foot: Secondary | ICD-10-CM

## 2014-07-13 ENCOUNTER — Other Ambulatory Visit: Payer: Self-pay | Admitting: Gastroenterology

## 2014-07-13 DIAGNOSIS — B182 Chronic viral hepatitis C: Secondary | ICD-10-CM

## 2014-07-13 DIAGNOSIS — K769 Liver disease, unspecified: Secondary | ICD-10-CM

## 2014-07-26 ENCOUNTER — Ambulatory Visit
Admission: RE | Admit: 2014-07-26 | Discharge: 2014-07-26 | Disposition: A | Discharge status: Home / Self Care | Payer: 59 | Source: Ambulatory Visit | Attending: Gastroenterology | Admitting: Gastroenterology

## 2014-07-26 DIAGNOSIS — K769 Liver disease, unspecified: Secondary | ICD-10-CM

## 2014-07-26 DIAGNOSIS — B182 Chronic viral hepatitis C: Secondary | ICD-10-CM

## 2014-07-26 MED ORDER — GADOXETATE DISODIUM 0.25 MMOL/ML IV SOLN
10.0000 mL | Freq: Once | INTRAVENOUS | Status: AC | PRN
  Administered 2014-07-26: 10 mL via INTRAVENOUS

## 2014-08-21 ENCOUNTER — Other Ambulatory Visit: Payer: Self-pay

## 2014-09-20 DIAGNOSIS — Z0000 Encounter for general adult medical examination without abnormal findings: Secondary | ICD-10-CM | POA: Insufficient documentation

## 2014-12-21 ENCOUNTER — Other Ambulatory Visit: Payer: Self-pay | Admitting: Gastroenterology

## 2014-12-21 DIAGNOSIS — B182 Chronic viral hepatitis C: Secondary | ICD-10-CM

## 2014-12-28 ENCOUNTER — Ambulatory Visit
Admission: RE | Admit: 2014-12-28 | Discharge: 2014-12-28 | Disposition: A | Discharge status: Home / Self Care | Payer: 59 | Source: Ambulatory Visit | Attending: Gastroenterology | Admitting: Gastroenterology

## 2014-12-28 DIAGNOSIS — B182 Chronic viral hepatitis C: Secondary | ICD-10-CM

## 2014-12-29 ENCOUNTER — Other Ambulatory Visit: Payer: Self-pay | Admitting: Gastroenterology

## 2014-12-29 DIAGNOSIS — K7460 Unspecified cirrhosis of liver: Secondary | ICD-10-CM

## 2014-12-29 DIAGNOSIS — B1920 Unspecified viral hepatitis C without hepatic coma: Secondary | ICD-10-CM

## 2015-01-11 ENCOUNTER — Ambulatory Visit
Admission: RE | Admit: 2015-01-11 | Discharge: 2015-01-11 | Disposition: A | Discharge status: Home / Self Care | Payer: 59 | Source: Ambulatory Visit | Attending: Gastroenterology | Admitting: Gastroenterology

## 2015-01-11 DIAGNOSIS — B1920 Unspecified viral hepatitis C without hepatic coma: Secondary | ICD-10-CM

## 2015-01-11 DIAGNOSIS — K7460 Unspecified cirrhosis of liver: Secondary | ICD-10-CM

## 2015-01-11 MED ORDER — GADOXETATE DISODIUM 0.25 MMOL/ML IV SOLN: 10.0000 mL | Freq: Once | INTRAVENOUS | Status: DC | PRN

## 2015-01-23 DIAGNOSIS — S32020A Wedge compression fracture of second lumbar vertebra, initial encounter for closed fracture: Secondary | ICD-10-CM | POA: Insufficient documentation

## 2015-02-07 ENCOUNTER — Other Ambulatory Visit (HOSPITAL_BASED_OUTPATIENT_CLINIC_OR_DEPARTMENT_OTHER): Payer: Self-pay | Admitting: Family Medicine

## 2015-02-07 DIAGNOSIS — Z0000 Encounter for general adult medical examination without abnormal findings: Secondary | ICD-10-CM

## 2015-02-09 ENCOUNTER — Ambulatory Visit (HOSPITAL_BASED_OUTPATIENT_CLINIC_OR_DEPARTMENT_OTHER)
Admission: RE | Admit: 2015-02-09 | Discharge: 2015-02-09 | Disposition: A | Discharge status: Home / Self Care | Payer: 59 | Source: Ambulatory Visit | Attending: Family Medicine | Admitting: Family Medicine

## 2015-02-09 DIAGNOSIS — Z0000 Encounter for general adult medical examination without abnormal findings: Secondary | ICD-10-CM | POA: Insufficient documentation

## 2015-02-09 DIAGNOSIS — M8580 Other specified disorders of bone density and structure, unspecified site: Secondary | ICD-10-CM | POA: Insufficient documentation

## 2015-03-07 MED FILL — OMEPRAZOLE DR 20 MG CAPSULE: 20 | 90 days supply | Qty: 90 | Fill #1

## 2015-03-07 MED FILL — CIALIS 20 MG TABLET: 20 | 90 days supply | Qty: 18 | Fill #0

## 2015-03-07 MED FILL — HYDROCHLOROTHIAZIDE 25 MG T: 25 | 90 days supply | Qty: 90 | Fill #1

## 2015-03-07 MED FILL — BUPROPION HCL XL 300 MG TAB: 300 | 90 days supply | Qty: 90 | Fill #1

## 2015-03-07 MED FILL — VALSARTAN 160 MG TABLET: 160 | 90 days supply | Qty: 90 | Fill #1

## 2015-03-08 DIAGNOSIS — H2513 Age-related nuclear cataract, bilateral: Secondary | ICD-10-CM | POA: Diagnosis not present

## 2015-03-08 DIAGNOSIS — H47321 Drusen of optic disc, right eye: Secondary | ICD-10-CM | POA: Diagnosis not present

## 2015-03-08 DIAGNOSIS — H43813 Vitreous degeneration, bilateral: Secondary | ICD-10-CM | POA: Diagnosis not present

## 2015-04-09 MED FILL — AMLODIPINE BESYLATE 10 MG T: 10 | 90 days supply | Qty: 90 | Fill #1

## 2015-05-24 MED FILL — HYDROCHLOROTHIAZIDE 25 MG T: 25 | 90 days supply | Qty: 90 | Fill #2

## 2015-05-24 MED FILL — OMEPRAZOLE DR 20 MG CAPSULE: 20 | 90 days supply | Qty: 90 | Fill #0

## 2015-05-24 MED FILL — BUPROPION HCL XL 300 MG TAB: 300 | 90 days supply | Qty: 90 | Fill #2

## 2015-05-24 MED FILL — VALSARTAN 160 MG TABLET: 160 | 90 days supply | Qty: 90 | Fill #2

## 2015-06-18 ENCOUNTER — Other Ambulatory Visit: Payer: Self-pay | Admitting: Gastroenterology

## 2015-06-18 DIAGNOSIS — B182 Chronic viral hepatitis C: Secondary | ICD-10-CM

## 2015-06-29 ENCOUNTER — Ambulatory Visit
Admission: RE | Admit: 2015-06-29 | Discharge: 2015-06-29 | Disposition: A | Discharge status: Home / Self Care | Payer: 59 | Source: Ambulatory Visit | Attending: Gastroenterology | Admitting: Gastroenterology

## 2015-06-29 DIAGNOSIS — K7460 Unspecified cirrhosis of liver: Secondary | ICD-10-CM | POA: Diagnosis not present

## 2015-06-29 DIAGNOSIS — B182 Chronic viral hepatitis C: Secondary | ICD-10-CM

## 2015-06-29 MED ORDER — GADOXETATE DISODIUM 0.25 MMOL/ML IV SOLN
10.0000 mL | Freq: Once | INTRAVENOUS | Status: AC | PRN
  Administered 2015-06-29: 10 mL via INTRAVENOUS

## 2015-07-02 MED FILL — AMLODIPINE BESYLATE 10 MG T: 10 | 90 days supply | Qty: 90 | Fill #2

## 2015-07-02 MED FILL — CIALIS 20 MG TABLET: 20 | 90 days supply | Qty: 18 | Fill #1

## 2015-07-04 DIAGNOSIS — K7469 Other cirrhosis of liver: Secondary | ICD-10-CM | POA: Diagnosis not present

## 2015-07-05 DIAGNOSIS — B182 Chronic viral hepatitis C: Secondary | ICD-10-CM | POA: Diagnosis not present

## 2015-08-09 MED FILL — OMEPRAZOLE DR 20 MG CAPSULE: 20 | 90 days supply | Qty: 90 | Fill #1

## 2015-08-09 MED FILL — HYDROCHLOROTHIAZIDE 25 MG T: 25 | 90 days supply | Qty: 90 | Fill #3

## 2015-08-09 MED FILL — VALSARTAN 160 MG TABLET: 160 | 90 days supply | Qty: 90 | Fill #3

## 2015-08-09 MED FILL — BUPROPION HCL XL 300 MG TAB: 300 | 90 days supply | Qty: 90 | Fill #3

## 2015-08-23 DIAGNOSIS — H2513 Age-related nuclear cataract, bilateral: Secondary | ICD-10-CM | POA: Diagnosis not present

## 2015-09-28 DIAGNOSIS — Z0000 Encounter for general adult medical examination without abnormal findings: Secondary | ICD-10-CM | POA: Diagnosis not present

## 2015-09-28 DIAGNOSIS — H25093 Other age-related incipient cataract, bilateral: Secondary | ICD-10-CM | POA: Insufficient documentation

## 2015-09-28 DIAGNOSIS — I10 Essential (primary) hypertension: Secondary | ICD-10-CM | POA: Diagnosis not present

## 2015-09-28 DIAGNOSIS — B182 Chronic viral hepatitis C: Secondary | ICD-10-CM | POA: Diagnosis not present

## 2015-09-28 DIAGNOSIS — Z9849 Cataract extraction status, unspecified eye: Secondary | ICD-10-CM | POA: Insufficient documentation

## 2015-09-28 DIAGNOSIS — N529 Male erectile dysfunction, unspecified: Secondary | ICD-10-CM | POA: Diagnosis not present

## 2015-09-28 DIAGNOSIS — K429 Umbilical hernia without obstruction or gangrene: Secondary | ICD-10-CM | POA: Diagnosis not present

## 2015-09-28 DIAGNOSIS — I2510 Atherosclerotic heart disease of native coronary artery without angina pectoris: Secondary | ICD-10-CM | POA: Diagnosis not present

## 2015-09-28 MED FILL — CIALIS 20 MG TABLET: 20 | 90 days supply | Qty: 18 | Fill #0

## 2015-10-30 MED FILL — AMLODIPINE BESYLATE 10 MG T: 10 | 90 days supply | Qty: 90 | Fill #3

## 2015-10-30 MED FILL — BUPROPION HCL XL 300 MG TAB: 300 | 90 days supply | Qty: 90 | Fill #0

## 2015-10-30 MED FILL — VALSARTAN 160 MG TABLET: 160 | 90 days supply | Qty: 90 | Fill #0

## 2015-10-30 MED FILL — HYDROCHLOROTHIAZIDE 25 MG T: 25 | 90 days supply | Qty: 90 | Fill #0

## 2015-11-01 DIAGNOSIS — L814 Other melanin hyperpigmentation: Secondary | ICD-10-CM | POA: Diagnosis not present

## 2015-11-01 DIAGNOSIS — D1801 Hemangioma of skin and subcutaneous tissue: Secondary | ICD-10-CM | POA: Diagnosis not present

## 2015-11-01 DIAGNOSIS — L821 Other seborrheic keratosis: Secondary | ICD-10-CM | POA: Diagnosis not present

## 2015-11-01 DIAGNOSIS — D235 Other benign neoplasm of skin of trunk: Secondary | ICD-10-CM | POA: Diagnosis not present

## 2015-11-09 DIAGNOSIS — Z23 Encounter for immunization: Secondary | ICD-10-CM | POA: Diagnosis not present

## 2015-11-09 DIAGNOSIS — J157 Pneumonia due to Mycoplasma pneumoniae: Secondary | ICD-10-CM | POA: Diagnosis not present

## 2015-11-09 MED FILL — AZITHROMYCIN 500 MG TABLET: 500 | 3 days supply | Qty: 3 | Fill #0

## 2015-11-19 ENCOUNTER — Other Ambulatory Visit: Payer: Self-pay | Admitting: Gastroenterology

## 2015-11-19 DIAGNOSIS — B1920 Unspecified viral hepatitis C without hepatic coma: Secondary | ICD-10-CM

## 2015-11-21 DIAGNOSIS — K7469 Other cirrhosis of liver: Secondary | ICD-10-CM | POA: Diagnosis not present

## 2015-11-21 DIAGNOSIS — B182 Chronic viral hepatitis C: Secondary | ICD-10-CM | POA: Diagnosis not present

## 2015-11-29 MED FILL — OMEPRAZOLE DR 20 MG CAPSULE: 20 | 90 days supply | Qty: 90 | Fill #0

## 2015-12-04 ENCOUNTER — Ambulatory Visit
Admission: RE | Admit: 2015-12-04 | Discharge: 2015-12-04 | Disposition: A | Discharge status: Home / Self Care | Payer: 59 | Source: Ambulatory Visit | Attending: Gastroenterology | Admitting: Gastroenterology

## 2015-12-04 DIAGNOSIS — B1920 Unspecified viral hepatitis C without hepatic coma: Secondary | ICD-10-CM

## 2015-12-04 DIAGNOSIS — K7689 Other specified diseases of liver: Secondary | ICD-10-CM | POA: Diagnosis not present

## 2015-12-04 MED ORDER — GADOXETATE DISODIUM 0.25 MMOL/ML IV SOLN
10.0000 mL | Freq: Once | INTRAVENOUS | Status: AC | PRN
  Administered 2015-12-04: 10 mL via INTRAVENOUS

## 2015-12-11 DIAGNOSIS — B182 Chronic viral hepatitis C: Secondary | ICD-10-CM | POA: Diagnosis not present

## 2016-01-21 MED FILL — AMLODIPINE BESYLATE 10 MG T: 10 | 90 days supply | Qty: 90 | Fill #0

## 2016-01-21 MED FILL — HYDROCHLOROTHIAZIDE 25 MG T: 25 | 90 days supply | Qty: 90 | Fill #1

## 2016-01-21 MED FILL — VALSARTAN 160 MG TABLET: 160 | 90 days supply | Qty: 90 | Fill #1

## 2016-01-21 MED FILL — BUPROPION HCL XL 300 MG TAB: 300 | 90 days supply | Qty: 90 | Fill #1

## 2016-01-21 MED FILL — CIALIS 20 MG TABLET: 20 | 90 days supply | Qty: 18 | Fill #1

## 2016-02-12 MED FILL — OMEPRAZOLE DR 20 MG CAPSULE: 20 | 90 days supply | Qty: 90 | Fill #1

## 2016-03-05 DIAGNOSIS — H2513 Age-related nuclear cataract, bilateral: Secondary | ICD-10-CM | POA: Diagnosis not present

## 2016-03-28 DIAGNOSIS — E7800 Pure hypercholesterolemia, unspecified: Secondary | ICD-10-CM | POA: Diagnosis not present

## 2016-03-28 DIAGNOSIS — F329 Major depressive disorder, single episode, unspecified: Secondary | ICD-10-CM | POA: Diagnosis not present

## 2016-03-28 DIAGNOSIS — E119 Type 2 diabetes mellitus without complications: Secondary | ICD-10-CM | POA: Diagnosis not present

## 2016-03-28 DIAGNOSIS — H10413 Chronic giant papillary conjunctivitis, bilateral: Secondary | ICD-10-CM | POA: Diagnosis not present

## 2016-03-28 DIAGNOSIS — R7309 Other abnormal glucose: Secondary | ICD-10-CM | POA: Diagnosis not present

## 2016-03-28 DIAGNOSIS — R7303 Prediabetes: Secondary | ICD-10-CM | POA: Insufficient documentation

## 2016-03-28 DIAGNOSIS — H25811 Combined forms of age-related cataract, right eye: Secondary | ICD-10-CM | POA: Diagnosis not present

## 2016-03-28 DIAGNOSIS — I10 Essential (primary) hypertension: Secondary | ICD-10-CM | POA: Diagnosis not present

## 2016-03-28 DIAGNOSIS — H25812 Combined forms of age-related cataract, left eye: Secondary | ICD-10-CM | POA: Diagnosis not present

## 2016-03-28 MED FILL — buPROPion HCL ER (XL) 150 M: 150 | 30 days supply | Qty: 30 | Fill #0

## 2016-03-28 MED FILL — ATORVASTATIN 10 MG TABLET: 10 | 90 days supply | Qty: 90 | Fill #0

## 2016-04-21 ENCOUNTER — Other Ambulatory Visit (HOSPITAL_BASED_OUTPATIENT_CLINIC_OR_DEPARTMENT_OTHER): Payer: Self-pay | Admitting: Family Medicine

## 2016-04-21 ENCOUNTER — Ambulatory Visit (HOSPITAL_BASED_OUTPATIENT_CLINIC_OR_DEPARTMENT_OTHER)
Admission: RE | Admit: 2016-04-21 | Discharge: 2016-04-21 | Disposition: A | Discharge status: Home / Self Care | Payer: 59 | Source: Ambulatory Visit | Attending: Family Medicine | Admitting: Family Medicine

## 2016-04-21 DIAGNOSIS — I700 Atherosclerosis of aorta: Secondary | ICD-10-CM | POA: Diagnosis not present

## 2016-04-21 DIAGNOSIS — M4854XA Collapsed vertebra, not elsewhere classified, thoracic region, initial encounter for fracture: Secondary | ICD-10-CM | POA: Insufficient documentation

## 2016-04-21 DIAGNOSIS — M954 Acquired deformity of chest and rib: Secondary | ICD-10-CM | POA: Insufficient documentation

## 2016-04-21 DIAGNOSIS — R05 Cough: Secondary | ICD-10-CM | POA: Diagnosis not present

## 2016-04-21 DIAGNOSIS — J984 Other disorders of lung: Secondary | ICD-10-CM | POA: Diagnosis not present

## 2016-04-21 DIAGNOSIS — I77810 Thoracic aortic ectasia: Secondary | ICD-10-CM | POA: Diagnosis not present

## 2016-04-21 DIAGNOSIS — R059 Cough, unspecified: Secondary | ICD-10-CM

## 2016-04-21 MED FILL — DOXYCYCLINE HYC 100 MG CAP: 100 | 10 days supply | Qty: 20 | Fill #0

## 2016-04-28 ENCOUNTER — Telehealth: Payer: Self-pay

## 2016-04-28 DIAGNOSIS — J411 Mucopurulent chronic bronchitis: Secondary | ICD-10-CM

## 2016-04-28 NOTE — Telephone Encounter (Signed)
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  He would like to see me for evaluation of recurrent bronchitis.  Prior to his visit I would like for him to have a HRCT to evaluate for bronchiectasis.    I'm OK with trying to fit him in next Friday afternoon (3/9) as my only patient if that is convenient to him, otherwise we can look at the following week.  Thanks B

## 2016-04-28 NOTE — Telephone Encounter (Signed)
lmtcb X1 for pt with pt's wife to return phone call.  Will order HRCT after speaking to patient.  Per BQ, need to add pt on to 3/15 or 3/16 afternoon clinic- 1:30.  This will be BQ's only patient for this clinic.

## 2016-04-29 DIAGNOSIS — J479 Bronchiectasis, uncomplicated: Secondary | ICD-10-CM | POA: Insufficient documentation

## 2016-04-29 NOTE — Telephone Encounter (Signed)
Pt returning call.Tommy Santiago ° °

## 2016-04-29 NOTE — Telephone Encounter (Signed)
Spoke with the pt  He wants to come in on 05/08/16 at 1:30  There is no schedule open yet  I ordered HRCT  I advised Patrice of schedule needing to be open for this pt

## 2016-05-05 MED FILL — AMLODIPINE BESYLATE 10 MG T: 10 | 90 days supply | Qty: 90 | Fill #1

## 2016-05-05 MED FILL — HYDROCHLOROTHIAZIDE 25 MG T: 25 | 90 days supply | Qty: 90 | Fill #2

## 2016-05-05 MED FILL — buPROPion HCL ER (XL) 150 M: 150 | 30 days supply | Qty: 30 | Fill #0

## 2016-05-05 MED FILL — VALSARTAN 160 MG TABLET: 160 | 90 days supply | Qty: 90 | Fill #2

## 2016-05-06 ENCOUNTER — Other Ambulatory Visit: Payer: 59

## 2016-05-07 ENCOUNTER — Encounter: Payer: Self-pay | Admitting: Pulmonary Disease

## 2016-05-07 ENCOUNTER — Ambulatory Visit (INDEPENDENT_AMBULATORY_CARE_PROVIDER_SITE_OTHER)
Admission: RE | Admit: 2016-05-07 | Discharge: 2016-05-07 | Disposition: A | Discharge status: Home / Self Care | Payer: 59 | Source: Ambulatory Visit | Attending: Pulmonary Disease | Admitting: Pulmonary Disease

## 2016-05-07 DIAGNOSIS — R05 Cough: Secondary | ICD-10-CM | POA: Diagnosis not present

## 2016-05-07 DIAGNOSIS — J411 Mucopurulent chronic bronchitis: Secondary | ICD-10-CM | POA: Diagnosis not present

## 2016-05-08 ENCOUNTER — Ambulatory Visit (INDEPENDENT_AMBULATORY_CARE_PROVIDER_SITE_OTHER): Payer: 59 | Admitting: Pulmonary Disease

## 2016-05-08 ENCOUNTER — Encounter: Payer: Self-pay | Admitting: Pulmonary Disease

## 2016-05-08 VITALS — Ht 71.0 in | Wt 229.0 lb

## 2016-05-08 DIAGNOSIS — J470 Bronchiectasis with acute lower respiratory infection: Secondary | ICD-10-CM | POA: Diagnosis not present

## 2016-05-08 DIAGNOSIS — R1319 Other dysphagia: Secondary | ICD-10-CM

## 2016-05-08 DIAGNOSIS — R1310 Dysphagia, unspecified: Secondary | ICD-10-CM | POA: Diagnosis not present

## 2016-05-08 MED ORDER — FLUTTER DEVI: 0 refills | Status: DC

## 2016-05-08 NOTE — Progress Notes (Signed)
Subjective:    Patient ID: Tommy Santiago, male    DOB: 10-Aug-1945, 71 y.o.   MRN: [id number]  HPI Chief Complaint  Patient presents with  . 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 He states that he was born at full-term and had no respiratory illnesses as a child. He started smoking in 1968 and smoked 2 packs of cigarettes daily through 1989. Since then he says he has not had any major respiratory illnesses throughout his adult life until the last several months when he said recurrent episodes of respiratory infection. Specifically, he was diagnosed with pneumonia in the right lower lobe eyes primary care physician in the fall of 2017. He presented with cough, chest congestion, mucus production based on a physical exam (no chest x-ray performed) he was diagnosed with pneumonia and treated with antibiotics. His symptoms eventually resolved within recurred again several weeks ago. He was diagnosed once again with pneumonia in the right lower lobe and this time a chest x-ray was performed. He's had persistent chest congestion and mucus production with this episode though overall his symptoms have improved with treatment with antibiotics. He still has some wheezing, pain in the right chest, and chest congestion and cough. Overall he feels that the symptoms have improved however.  He denies any serious episodes of pneumonia. He has no family history of lung disease. He has children. He does not have a history of recurrent sinus infections. He has a spleen.  He states that he has experienced some aspiration of liquids in particular periodically in the last several months to years. He says solids typically go down okay. Once he can initiate a swallow successfully with food will pass through his esophagus without getting stuck. He sometimes has a  worsening cough and severe heartburn when lying flat. This is typically worse after eating spicy food. He does prop up the head of his bed to help with this.   Past Medical History:  Diagnosis Date  . Coronary atherosclerosis of unspecified type of vessel, native or graft    status post cardiac cath w percutaneous coronary intervention using XIENCE drug-eluting stent to mid left anterior descending 07/23/08; LHC 08/07/11: Mid LAD stent patent, moderate size diagonal branch jailed by stent with 70% ostial stenosis, unchanged from 2010 with excellent flow down the diagonal branch, mid circumflex 30%, proximal and mid RCA 20%, EF 60%.  Medical therapy continued.   . Hypopotassemia   . Lichen planus   . Other and unspecified hyperlipidemia   . Other specified cardiac dysrhythmias(427.89)   . Paroxysmal supraventricular tachycardia ([place])   . Personal history of other infectious and parasitic disease   . Plantar fascial fibromatosis   . Syncope and collapse   . Unspecified chronic bronchitis      Family History  Problem Relation Age of Onset  . Coronary artery disease Brother     diagnosed in early 56s  . Coronary artery disease Other     hx family     Social History   Social History  . Marital status: Married    Spouse name: N/A  . Number of children: N/A  . Years of education: N/A   Occupational History  . Not on file.   Social History Main Topics  . Smoking status: Former Research scientist (life sciences)  . Smokeless tobacco: Never  Used     Comment: greater than 40 pack per year...quit 1989  . Alcohol use Yes     Comment: quit 25 years ago   . Drug use: Yes     Comment: quit 25 years ago  . Sexual activity: Not on file   Other Topics Concern  . Not on file   Social History Narrative  . No narrative on file     No Known Allergies   Outpatient Medications Prior to Visit  Medication Sig Dispense Refill  . aspirin 325 MG tablet Take 325 mg by mouth daily.    . B Complex Vitamins (VITAMIN B  COMPLEX PO) Take 1 tablet by mouth daily.    Marland Kitchen buPROPion (WELLBUTRIN XL) 300 MG 24 hr tablet Take 300 mg by mouth daily.      . Calcium Carbonate-Vitamin D (CALCIUM 600/VITAMIN D) 600-400 MG-UNIT per tablet Take 1 tablet by mouth 2 (two) times daily.      . Cholecalciferol (VITAMIN D3) 2000 UNITS capsule Take 2,000 Units by mouth daily.      . diphenhydramine-acetaminophen (TYLENOL PM EXTRA STRENGTH) 25-500 MG TABS Take 1 tablet by mouth as needed. Use as directed.  Patient uses this medication for pain.    . hydrochlorothiazide (HYDRODIURIL) 25 MG tablet Take 25 mg by mouth daily.    Marland Kitchen omeprazole (PRILOSEC) 20 MG capsule Take 20 mg by mouth as needed. Patient uses this medication for acid reflux.    . psyllium (METAMUCIL) 58.6 % powder Take 1 packet by mouth daily. Patient uses this mediation for regularity.    . tadalafil (CIALIS) 20 MG tablet Take 20 mg by mouth daily as needed. Patient uses this medication for ED.    . valsartan (DIOVAN) 160 MG tablet Take 160 mg by mouth daily.      . vitamin E 400 UNIT capsule Take 400 Units by mouth daily.    . Omega-3 Fatty Acids (FISH OIL) 1000 MG CAPS Take by mouth daily. 2 caps     . nitroGLYCERIN (NITROSTAT) 0.4 MG SL tablet Place 1 tablet (0.4 mg total) under the tongue every 5 (five) minutes as needed for chest pain. 25 tablet 3   No facility-administered medications prior to visit.       Review of Systems  Constitutional: Negative for activity change, appetite change, chills and fever.  HENT: Negative for congestion, ear pain, hearing loss, postnasal drip, rhinorrhea, sinus pressure and sneezing.   Eyes: Negative for redness, itching and visual disturbance.  Respiratory: Positive for cough and wheezing. Negative for chest tightness and shortness of breath.   Cardiovascular: Negative for chest pain, palpitations and leg swelling.  Gastrointestinal: Negative for abdominal distention, abdominal pain, blood in stool, constipation, diarrhea, nausea  and vomiting.  Musculoskeletal: Negative for arthralgias, gait problem, joint swelling, myalgias, neck pain and neck stiffness.  Skin: Negative for rash.  Neurological: Negative for dizziness, light-headedness, numbness and headaches.  Hematological: Does not bruise/bleed easily.  Psychiatric/Behavioral: Negative for confusion and dysphoric mood.       Objective:   Physical Exam Vitals:   05/08/16 1334  Weight: 229 lb (103.9 kg)  Height: 5\' 11"  (1.803 m)   RA  Gen: well appearing, no acute distress HENT: NCAT, OP clear, neck supple without masses Eyes: PERRL, EOMi Lymph: no cervical lymphadenopathy PULM: Crackles bases B CV: RRR, no mgr, no JVD GI: BS+, soft, nontender, no hsm Derm: no rash or skin breakdown MSK: normal bulk and tone Neuro: A&Ox4, CN II-XII intact,  strength 5/5 in all 4 extremities Psyche: normal mood and affect   Imaging: March 2018 CT chest images independently reviewed: Normal pulmonary parenchyma in the upper lobes bilaterally, there is cylindrical bronchiectasis and mucous plugging in the bases, mild to moderate in severity. Pulmonary nodule left lower lobe stable compared to 2007.     Assessment & Plan:  Bronchiectasis without acute exacerbation ([place]) Tommy Santiago is here to see me for recurrent respiratory infections over the last several months. Based on his clinical history, physical exam, chest x-ray findings we had a high index of suspicion for bronchiectasis so a high-resolution CT scan of the chest was performed. 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  In terms of etiology I worry about chronic aspiration. The bronchiectasis seems to have developed since his last CT scan which showed images of the lower lobes of his lung several years ago. He does not have a history supportive of immunosuppression, severe respiratory infection, or genetic cause of bronchiectasis.  Overall his symptoms  appear to be mild and at this point the major management changes we need to focus on moving for her preventing infection and how to deal with infection when it occurs. He is at increased risk for respiratory infection given his bronchiectasis.  Plan: Flutter valve provided today He was instructed in the event of a respiratory infection to use mucociliary clearance measures twice a day to help clear out mucus. Specifically he was given instructions on the huff-cough technique and manual chest physiotherapy. If mucociliary clearance measures do not improve symptoms within 2 days of the onset of a respiratory infection (cough, mucus production, fever) he should call me right away so that I can prescribe an antibiotic. Follow-up on an annual basis or sooner if needed    Dysphagia He reports dysphagia symptoms for the last several months. I worry that this is leading to chronic aspiration as a cause for his bronchiectasis. Alternatively, he may have some esophageal dysmotility issues with his notable acid reflux symptoms, but based on his clinical history pharyngeal based dysphagia is the primary problem.  Plan: Modified barium swallow If no pathology identified on modified barium swallow then discuss dysphagia with his gastroenterologist.    Current Outpatient Prescriptions:  .  aspirin 325 MG tablet, Take 325 mg by mouth daily., Disp: , Rfl:  .  B Complex Vitamins (VITAMIN B COMPLEX PO), Take 1 tablet by mouth daily., Disp: , Rfl:  .  buPROPion (WELLBUTRIN XL) 300 MG 24 hr tablet, Take 300 mg by mouth daily.  , Disp: , Rfl:  .  Calcium Carbonate-Vitamin D (CALCIUM 600/VITAMIN D) 600-400 MG-UNIT per tablet, Take 1 tablet by mouth 2 (two) times daily.  , Disp: , Rfl:  .  Cholecalciferol (VITAMIN D3) 2000 UNITS capsule, Take 2,000 Units by mouth daily.  , Disp: , Rfl:  .  diphenhydramine-acetaminophen (TYLENOL PM EXTRA STRENGTH) 25-500 MG TABS, Take 1 tablet by mouth as needed. Use as directed.   Patient uses this medication for pain., Disp: , Rfl:  .  hydrochlorothiazide (HYDRODIURIL) 25 MG tablet, Take 25 mg by mouth daily., Disp: , Rfl:  .  Melatonin 5 MG TABS, Take by mouth., Disp: , Rfl:  .  omeprazole (PRILOSEC) 20 MG capsule, Take 20 mg by mouth as needed. Patient uses this medication for acid reflux., Disp: , Rfl:  .  psyllium (METAMUCIL) 58.6 % powder, Take 1 packet by mouth daily. Patient uses this mediation for regularity.,  Disp: , Rfl:  .  tadalafil (CIALIS) 20 MG tablet, Take 20 mg by mouth daily as needed. Patient uses this medication for ED., Disp: , Rfl:  .  valsartan (DIOVAN) 160 MG tablet, Take 160 mg by mouth daily.  , Disp: , Rfl:  .  vitamin E 400 UNIT capsule, Take 400 Units by mouth daily., Disp: , Rfl:  .  nitroGLYCERIN (NITROSTAT) 0.4 MG SL tablet, Place 1 tablet (0.4 mg total) under the tongue every 5 (five) minutes as needed for chest pain., Disp: 25 tablet, Rfl: 3 .  Respiratory Therapy Supplies (FLUTTER) DEVI, Use as directed, Disp: 1 each, Rfl: 0

## 2016-05-08 NOTE — Assessment & Plan Note (Signed)
Tommy Santiago is here to see me for recurrent respiratory infections over the last several months. Based on his clinical history, physical exam, chest x-ray findings we had a high index of suspicion for bronchiectasis so a high-resolution CT scan of the chest was performed. 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  In terms of etiology I worry about chronic aspiration. The bronchiectasis seems to have developed since his last CT scan which showed images of the lower lobes of his lung several years ago. He does not have a history supportive of immunosuppression, severe respiratory infection, or genetic cause of bronchiectasis.  Overall his symptoms appear to be mild and at this point the major management changes we need to focus on moving for her preventing infection and how to deal with infection when it occurs. He is at increased risk for respiratory infection given his bronchiectasis.  Plan: Flutter valve provided today He was instructed in the event of a respiratory infection to use mucociliary clearance measures twice a day to help clear out mucus. Specifically he was given instructions on the huff-cough technique and manual chest physiotherapy. If mucociliary clearance measures do not improve symptoms within 2 days of the onset of a respiratory infection (cough, mucus production, fever) he should call me right away so that I can prescribe an antibiotic. Follow-up on an annual basis or sooner if needed

## 2016-05-08 NOTE — Assessment & Plan Note (Signed)
He reports dysphagia symptoms for the last several months. I worry that this is leading to chronic aspiration as a cause for his bronchiectasis. Alternatively, he may have some esophageal dysmotility issues with his notable acid reflux symptoms, but based on his clinical history pharyngeal based dysphagia is the primary problem.  Plan: Modified barium swallow If no pathology identified on modified barium swallow then discuss dysphagia with his gastroenterologist.

## 2016-05-08 NOTE — Patient Instructions (Signed)
In the event of a respiratory infection, I recommend that you make effort at enhancing your mucociliary clearance at least twice per day: chest PT, flutter valve, or huff cough technique  If this doesn't help after a couple of days, call me for an antibiotic  We will arrange for a modified barium swallow  We will see you back in one year or sooner if needed

## 2016-05-09 ENCOUNTER — Other Ambulatory Visit (HOSPITAL_COMMUNITY): Payer: Self-pay | Admitting: Pulmonary Disease

## 2016-05-09 DIAGNOSIS — R1319 Other dysphagia: Secondary | ICD-10-CM

## 2016-05-15 ENCOUNTER — Ambulatory Visit (HOSPITAL_COMMUNITY)
Admission: RE | Admit: 2016-05-15 | Discharge: 2016-05-15 | Disposition: A | Discharge status: Home / Self Care | Payer: 59 | Source: Ambulatory Visit | Attending: Pulmonary Disease | Admitting: Pulmonary Disease

## 2016-05-15 DIAGNOSIS — R1319 Other dysphagia: Secondary | ICD-10-CM | POA: Diagnosis not present

## 2016-05-15 DIAGNOSIS — R1310 Dysphagia, unspecified: Secondary | ICD-10-CM | POA: Insufficient documentation

## 2016-05-15 DIAGNOSIS — R05 Cough: Secondary | ICD-10-CM | POA: Diagnosis not present

## 2016-05-20 NOTE — Progress Notes (Signed)
Left message for patient to contact office for medical results.

## 2016-06-19 DIAGNOSIS — R7303 Prediabetes: Secondary | ICD-10-CM | POA: Diagnosis not present

## 2016-06-19 DIAGNOSIS — J479 Bronchiectasis, uncomplicated: Secondary | ICD-10-CM | POA: Diagnosis not present

## 2016-06-19 DIAGNOSIS — I10 Essential (primary) hypertension: Secondary | ICD-10-CM | POA: Diagnosis not present

## 2016-06-19 DIAGNOSIS — E7800 Pure hypercholesterolemia, unspecified: Secondary | ICD-10-CM | POA: Diagnosis not present

## 2016-06-20 MED FILL — ATORVASTATIN 10 MG TABLET: 10 | 90 days supply | Qty: 90 | Fill #1

## 2016-06-24 MED FILL — metFORMIN HCL 500 MG TABS: 500 | 45 days supply | Qty: 90 | Fill #0

## 2016-07-10 ENCOUNTER — Inpatient Hospital Stay (HOSPITAL_BASED_OUTPATIENT_CLINIC_OR_DEPARTMENT_OTHER)
Admission: EM | Admit: 2016-07-10 | Discharge: 2016-07-12 | DRG: 871 | Disposition: A | Discharge status: Home / Self Care | Payer: 59 | Attending: Internal Medicine | Admitting: Internal Medicine

## 2016-07-10 ENCOUNTER — Emergency Department (HOSPITAL_BASED_OUTPATIENT_CLINIC_OR_DEPARTMENT_OTHER): Payer: 59

## 2016-07-10 ENCOUNTER — Encounter (HOSPITAL_BASED_OUTPATIENT_CLINIC_OR_DEPARTMENT_OTHER): Payer: Self-pay | Admitting: *Deleted

## 2016-07-10 DIAGNOSIS — J479 Bronchiectasis, uncomplicated: Secondary | ICD-10-CM | POA: Diagnosis not present

## 2016-07-10 DIAGNOSIS — J470 Bronchiectasis with acute lower respiratory infection: Secondary | ICD-10-CM | POA: Diagnosis present

## 2016-07-10 DIAGNOSIS — J189 Pneumonia, unspecified organism: Secondary | ICD-10-CM | POA: Diagnosis not present

## 2016-07-10 DIAGNOSIS — I10 Essential (primary) hypertension: Secondary | ICD-10-CM | POA: Diagnosis present

## 2016-07-10 DIAGNOSIS — Z87891 Personal history of nicotine dependence: Secondary | ICD-10-CM

## 2016-07-10 DIAGNOSIS — B1920 Unspecified viral hepatitis C without hepatic coma: Secondary | ICD-10-CM | POA: Diagnosis present

## 2016-07-10 DIAGNOSIS — A419 Sepsis, unspecified organism: Secondary | ICD-10-CM | POA: Diagnosis not present

## 2016-07-10 DIAGNOSIS — R05 Cough: Secondary | ICD-10-CM | POA: Diagnosis not present

## 2016-07-10 DIAGNOSIS — I2510 Atherosclerotic heart disease of native coronary artery without angina pectoris: Secondary | ICD-10-CM | POA: Diagnosis present

## 2016-07-10 DIAGNOSIS — J471 Bronchiectasis with (acute) exacerbation: Secondary | ICD-10-CM | POA: Diagnosis not present

## 2016-07-10 DIAGNOSIS — R739 Hyperglycemia, unspecified: Secondary | ICD-10-CM | POA: Diagnosis not present

## 2016-07-10 DIAGNOSIS — R000 Tachycardia, unspecified: Secondary | ICD-10-CM | POA: Diagnosis not present

## 2016-07-10 DIAGNOSIS — Z8249 Family history of ischemic heart disease and other diseases of the circulatory system: Secondary | ICD-10-CM

## 2016-07-10 DIAGNOSIS — Z955 Presence of coronary angioplasty implant and graft: Secondary | ICD-10-CM | POA: Diagnosis not present

## 2016-07-10 HISTORY — DX: Unspecified viral hepatitis C without hepatic coma: B19.20

## 2016-07-10 HISTORY — DX: Essential (primary) hypertension: I10

## 2016-07-10 HISTORY — DX: Bronchiectasis, uncomplicated: J47.9

## 2016-07-10 LAB — COMPREHENSIVE METABOLIC PANEL
ALBUMIN: 4.2 g/dL (ref 3.5–5.0)
ALT: 20 U/L (ref 17–63)
AST: 25 U/L (ref 15–41)
Alkaline Phosphatase: 52 U/L (ref 38–126)
Anion gap: 14 (ref 5–15)
BILIRUBIN TOTAL: 1.1 mg/dL (ref 0.3–1.2)
BUN: 17 mg/dL (ref 6–20)
CHLORIDE: 98 mmol/L — AB (ref 101–111)
CO2: 20 mmol/L — ABNORMAL LOW (ref 22–32)
Calcium: 8.9 mg/dL (ref 8.9–10.3)
Creatinine, Ser: 0.99 mg/dL (ref 0.61–1.24)
GFR calc Af Amer: >60 mL/min (ref >60)
GFR calc non Af Amer: >60 mL/min (ref >60)
Glucose, Bld: 138 mg/dL — ABNORMAL HIGH (ref 65–99)
Potassium: 3.5 mmol/L (ref 3.5–5.1)
Sodium: 132 mmol/L — ABNORMAL LOW (ref 135–145)
Total Protein: 7 g/dL (ref 6.5–8.1)

## 2016-07-10 LAB — CBC WITH DIFFERENTIAL/PLATELET
Basophils Absolute: 0 10*3/uL (ref 0.0–0.1)
Basophils Relative: 0 %
EOS ABS: 0 10*3/uL (ref 0.0–0.7)
Eosinophils Relative: 0 %
HCT: 44.3 % (ref 39.0–52.0)
Hemoglobin: 15.8 g/dL (ref 13.0–17.0)
LYMPHS ABS: 0.5 10*3/uL — AB (ref 0.7–4.0)
LYMPHS PCT: 4 %
MCH: 33.9 pg (ref 26.0–34.0)
MCHC: 35.7 g/dL (ref 30.0–36.0)
MCV: 95.1 fL (ref 78.0–100.0)
MONO ABS: 1.5 10*3/uL — AB (ref 0.1–1.0)
Monocytes Relative: 11 %
Neutro Abs: 11.4 10*3/uL — ABNORMAL HIGH (ref 1.7–7.7)
Neutrophils Relative %: 85 %
PLATELETS: 297 10*3/uL (ref 150–400)
RBC: 4.66 MIL/uL (ref 4.22–5.81)
RDW: 12.3 % (ref 11.5–15.5)
WBC: 13.5 10*3/uL — AB (ref 4.0–10.5)

## 2016-07-10 LAB — URINALYSIS, MICROSCOPIC (REFLEX)

## 2016-07-10 LAB — I-STAT CG4 LACTIC ACID, ED
Lactic Acid, Venous: 2.01 mmol/L (ref 0.5–1.9)
Lactic Acid, Venous: 1.99 mmol/L (ref 0.5–1.9)

## 2016-07-10 LAB — URINALYSIS, ROUTINE W REFLEX MICROSCOPIC
Glucose, UA: NEGATIVE mg/dL
Hgb urine dipstick: NEGATIVE
KETONES UR: 15 mg/dL — AB
Nitrite: NEGATIVE
PH: 5.5 (ref 5.0–8.0)
Protein, ur: NEGATIVE mg/dL
SPECIFIC GRAVITY, URINE: 1.027 (ref 1.005–1.030)

## 2016-07-10 LAB — LIPASE, BLOOD: Lipase: 13 U/L (ref 11–51)

## 2016-07-10 MED ORDER — SODIUM CHLORIDE 0.9 % IV BOLUS (SEPSIS)
1000.0000 mL | Freq: Once | INTRAVENOUS | Status: AC
  Administered 2016-07-10: 1000 mL via INTRAVENOUS

## 2016-07-10 MED ORDER — DEXTROSE 5 % IV SOLN
1.0000 g | Freq: Once | INTRAVENOUS | Status: AC
  Administered 2016-07-10: 1 g via INTRAVENOUS
  Filled 2016-07-10: qty 10

## 2016-07-10 MED ORDER — ONDANSETRON HCL 4 MG/2ML IJ SOLN
4.0000 mg | Freq: Once | INTRAMUSCULAR | Status: AC
  Administered 2016-07-10: 4 mg via INTRAVENOUS
  Filled 2016-07-10: qty 2

## 2016-07-10 MED ORDER — FENTANYL CITRATE (PF) 100 MCG/2ML IJ SOLN
50.0000 ug | Freq: Once | INTRAMUSCULAR | Status: AC
Start: 2016-07-10 — End: 2016-07-10
  Administered 2016-07-10: 50 ug via INTRAVENOUS
  Filled 2016-07-10: qty 2

## 2016-07-10 MED ORDER — DEXTROSE 5 % IV SOLN: 500.0000 mg | Freq: Once | INTRAVENOUS | Status: DC

## 2016-07-10 MED ORDER — AZITHROMYCIN 500 MG IV SOLR
INTRAVENOUS | Status: AC
  Filled 2016-07-10: qty 500

## 2016-07-10 MED ORDER — FENTANYL CITRATE (PF) 100 MCG/2ML IJ SOLN
50.0000 ug | Freq: Once | INTRAMUSCULAR | Status: AC
  Administered 2016-07-10: 50 ug via INTRAVENOUS
  Filled 2016-07-10: qty 2

## 2016-07-10 NOTE — Progress Notes (Signed)
Called for possible admission from [facility].  Patient is a 71 yo with h/o prior PNA presenting with symptoms similar to prior episodes of PNA.  Negative CXR but chest CT shows bronchiectasis and LLL PNA.  CURB-65 score is 1.  Also has 3 SIRS criteria concerning for sepsis.  Given Rocephin/Azithromycin.  Lactate was 2, BP a bit on the low side.  O2 sats  87%, placed on 2L [place].  Will plan to admit to telemetry.    Carlyon Shadow, M.D

## 2016-07-10 NOTE — ED Triage Notes (Signed)
Patient states he woke up this morning not feeling well.  States he had an appointment with his dentist for dental work which was completed.  States afterwards, he developed generalized body aches, temp of 100.5, chills and sweats.  States over the last few weeks he has had right flank "kidney" pain, and pain in the right chest from a chronic lower lobe lung infection.

## 2016-07-10 NOTE — ED Notes (Signed)
[facility] here for transport. 

## 2016-07-10 NOTE — ED Notes (Signed)
[facility] [place]

## 2016-07-10 NOTE — ED Provider Notes (Signed)
[facility] DEPT [facility] Provider Note   CSN: [id number] Arrival date & time: 07/10/16  1430     History   Chief Complaint Chief Complaint  Patient presents with  . Generalized Body Aches  . Chills  . Weakness  . Cough    HPI Tommy Santiago is a 71 y.o. male.  The history is provided by the patient, the spouse and medical records.  Cough  This is a recurrent problem. The current episode started 2 days ago. The problem occurs constantly. The problem has been gradually worsening. The cough is productive of sputum. The maximum temperature recorded prior to his arrival was 100 to 100.9 F. The fever has been present for less than 1 day. Associated symptoms include chills and shortness of breath. Pertinent negatives include no chest pain, no headaches and no wheezing. He has tried nothing for the symptoms. The treatment provided no relief. His past medical history is significant for pneumonia (hx of).    Past Medical History:  Diagnosis Date  . Coronary atherosclerosis of unspecified type of vessel, native or graft    status post cardiac cath w percutaneous coronary intervention using XIENCE drug-eluting stent to mid left anterior descending 07/23/08; LHC 08/07/11: Mid LAD stent patent, moderate size diagonal branch jailed by stent with 70% ostial stenosis, unchanged from 2010 with excellent flow down the diagonal branch, mid circumflex 30%, proximal and mid RCA 20%, EF 60%.  Medical therapy continued.   . Hepatitis C    Treated [place]  . Hypertension   . Hypopotassemia   . Lichen planus   . Other and unspecified hyperlipidemia   . Other specified cardiac dysrhythmias(427.89)   . Paroxysmal supraventricular tachycardia ([place])   . Personal history of other infectious and parasitic disease   . Plantar fascial fibromatosis   . Syncope and collapse   . Unspecified chronic bronchitis [facility])     Patient Active Problem List   Diagnosis Date Noted  . Dysphagia 05/08/2016  .  Bronchiectasis without acute exacerbation ([place]) 04/29/2016  . CAD, NATIVE VESSEL 07/24/2009  . HYPERLIPIDEMIA-MIXED 06/30/2008  . HYPOKALEMIA 06/30/2008  . HYPERTENSION, UNSPECIFIED 06/30/2008  . CAD, UNSPECIFIED SITE 06/30/2008  . PAROXYSMAL SUPRAVENTRICULAR TACHYCARDIA 06/30/2008  . BRADYCARDIA 06/30/2008  . LICHEN PLANUS, MOUTH 06/30/2008  . PLANTAR FASCIITIS 06/30/2008  . VASOVAGAL SYNCOPE 06/30/2008  . HEPATITIS C, HX OF 06/30/2008    Past Surgical History:  Procedure Laterality Date  . CHOLECYSTECTOMY    . colonscopy     status post  . CORONARY ANGIOPLASTY WITH STENT PLACEMENT    . ESOPHAGOGASTRODUODENOSCOPY     status post  . KNEE ARTHROSCOPY    . LEFT HEART CATHETERIZATION WITH CORONARY ANGIOGRAM  08/07/2011   Procedure: LEFT HEART CATHETERIZATION WITH CORONARY ANGIOGRAM;  Surgeon: Burnell Blanks, MD;  Location: [facility] CATH LAB;  Service: Cardiovascular;;       Home Medications    Prior to Admission medications   Medication Sig Start Date End Date Taking? Authorizing Provider  aspirin 325 MG tablet Take 325 mg by mouth daily.    [provider]  B Complex Vitamins (VITAMIN B COMPLEX PO) Take 1 tablet by mouth daily.    [provider]  buPROPion (WELLBUTRIN XL) 300 MG 24 hr tablet Take 300 mg by mouth daily.      [provider]  Calcium Carbonate-Vitamin D (CALCIUM 600/VITAMIN D) 600-400 MG-UNIT per tablet Take 1 tablet by mouth 2 (two) times daily.      [provider]  Cholecalciferol (VITAMIN D3) 2000 UNITS capsule Take 2,000 Units by mouth daily.      [provider]  diphenhydramine-acetaminophen (TYLENOL PM EXTRA STRENGTH) 25-500 MG TABS Take 1 tablet by mouth as needed. Use as directed.  Patient uses this medication for pain.    [provider]  hydrochlorothiazide (HYDRODIURIL) 25 MG tablet Take 25 mg by mouth daily.    [provider]  Melatonin 5 MG TABS Take by mouth.    [provider]  nitroGLYCERIN (NITROSTAT) 0.4 MG SL tablet Place 1 tablet (0.4 mg total) under the tongue every 5 (five) minutes as needed for chest pain. 08/07/11 07/29/13  Barrett, Evelene Croon, PA-C  omeprazole (PRILOSEC) 20 MG capsule Take 20 mg by mouth as needed. Patient uses this medication for acid reflux.    [provider]  psyllium (METAMUCIL) 58.6 % powder Take 1 packet by mouth daily. Patient uses this mediation for regularity.    [provider]  Respiratory Therapy Supplies (FLUTTER) DEVI Use as directed 05/08/16   Juanito Doom, MD  tadalafil (CIALIS) 20 MG tablet Take 20 mg by mouth daily as needed. Patient uses this medication for ED.    [provider]  valsartan (DIOVAN) 160 MG tablet Take 160 mg by mouth daily.      [provider]  vitamin E 400 UNIT capsule Take 400 Units by mouth daily.    [provider]    Family History Family History  Problem Relation Age of Onset  . Coronary artery disease Brother        diagnosed in early 83s  . Coronary artery disease Other        hx family    Social History Social History  Substance Use Topics  . Smoking status: Former Research scientist (life sciences)  . Smokeless tobacco: Never Used     Comment: greater than 40 pack per year...quit 1989  . Alcohol use Yes     Comment: quit 25 years ago      Allergies   Patient has no known allergies.   Review of Systems Review of Systems  Constitutional: Positive for chills, fatigue and fever. Negative for diaphoresis.  HENT: Positive for congestion.   Respiratory: Positive for cough and shortness of breath. Negative for chest tightness, wheezing and stridor.   Cardiovascular: Negative for chest pain, palpitations and leg swelling.  Gastrointestinal: Negative for abdominal pain, constipation, diarrhea, nausea and vomiting.  Genitourinary: Negative for dysuria and flank pain.  Musculoskeletal: Negative for back pain.  Skin: Negative for rash.  Neurological:  Negative for weakness, numbness and headaches.  Psychiatric/Behavioral: Negative for agitation.  All other systems reviewed and are negative.    Physical Exam Updated Vital Signs BP 97/63 (BP Location: Left Arm)   Pulse (!) 115   Temp 98.4 F (36.9 C) (Oral)   Resp 20   Ht 5\' 11"  (1.803 m)   Wt 225 lb (102.1 kg)   SpO2 97%   BMI 31.38 kg/m   Physical Exam  Constitutional: He appears well-developed and well-nourished.  HENT:  Head: Normocephalic and atraumatic.  Mouth/Throat: Oropharynx is clear and moist. No oropharyngeal exudate.  Eyes: Conjunctivae and EOM are normal. Pupils are equal, round, and reactive to light.  Neck: Normal range of motion. Neck supple.  Cardiovascular: Regular rhythm and intact distal pulses.  Tachycardia present.   No murmur heard. Pulmonary/Chest: Effort normal. No stridor. Tachypnea noted. No respiratory distress. He has rhonchi. He exhibits no tenderness.  Abdominal: Soft. There  is no tenderness.  Musculoskeletal: He exhibits no edema or tenderness.  Neurological: He is alert. No sensory deficit.  Skin: Skin is warm and dry. Capillary refill takes less than 2 seconds. No erythema. No pallor.  Psychiatric: He has a normal mood and affect.  Nursing note and vitals reviewed.    ED Treatments / Results  Labs (all labs ordered are listed, but only abnormal results are displayed) Labs Reviewed  COMPREHENSIVE METABOLIC PANEL - Abnormal; Notable for the following:       Result Value   Sodium 132 (*)    Chloride 98 (*)    CO2 20 (*)    Glucose, Bld 138 (*)    All other components within normal limits  CBC WITH DIFFERENTIAL/PLATELET - Abnormal; Notable for the following:    WBC 13.5 (*)    Neutro Abs 11.4 (*)    Lymphs Abs 0.5 (*)    Monocytes Absolute 1.5 (*)    All other components within normal limits  URINALYSIS, ROUTINE W REFLEX MICROSCOPIC - Abnormal; Notable for the following:    Color, Urine AMBER (*)    APPearance CLOUDY (*)     Bilirubin Urine SMALL (*)    Ketones, ur 15 (*)    Leukocytes, UA TRACE (*)    All other components within normal limits  URINALYSIS, MICROSCOPIC (REFLEX) - Abnormal; Notable for the following:    Bacteria, UA FEW (*)    Squamous Epithelial / LPF 0-5 (*)    All other components within normal limits  PROTIME-INR - Abnormal; Notable for the following:    Prothrombin Time 17.3 (*)    All other components within normal limits  BASIC METABOLIC PANEL - Abnormal; Notable for the following:    Potassium 3.3 (*)    Calcium 8.1 (*)    All other components within normal limits  CBC WITH DIFFERENTIAL/PLATELET - Abnormal; Notable for the following:    RBC 3.92 (*)    HCT 37.5 (*)    All other components within normal limits  I-STAT CG4 LACTIC ACID, ED - Abnormal; Notable for the following:    Lactic Acid, Venous 2.01 (*)    All other components within normal limits  I-STAT CG4 LACTIC ACID, ED - Abnormal; Notable for the following:    Lactic Acid, Venous 1.99 (*)    All other components within normal limits  CULTURE, BLOOD (ROUTINE X 2)  CULTURE, BLOOD (ROUTINE X 2)  CULTURE, EXPECTORATED SPUTUM-ASSESSMENT  GRAM STAIN  LIPASE, BLOOD  INFLUENZA PANEL BY PCR (TYPE A & B)  LACTIC ACID, PLASMA  LACTIC ACID, PLASMA  PROCALCITONIN  APTT  STREP PNEUMONIAE URINARY ANTIGEN  HIV ANTIBODY (ROUTINE TESTING)  RAPID URINE DRUG SCREEN, HOSP PERFORMED    EKG  EKG Interpretation  Date/Time:  Thursday Jul 10 2016 14:56:55 EDT Ventricular Rate:  114 PR Interval:  152 QRS Duration: 76 QT Interval:  318 QTC Calculation: 438 R Axis:   60 Text Interpretation:  Sinus tachycardia with Premature supraventricular complexes Otherwise normal ECG When compared to prior, new tachycardia. No STEMI Confirmed by Antony Blackbird [phone]) on 07/10/2016 3:10:03 PM       Radiology Dg Chest 2 View  Result Date: 07/10/2016 CLINICAL DATA:  Weakness since this am, sob on exertion, cough. Nonsmoker. EXAM: CHEST  2  VIEW COMPARISON:  04/21/2016 FINDINGS: Cardiac silhouette is normal in size. No mediastinal or hilar masses. No evidence of adenopathy. Stable scarring noted at both lung bases.  Lungs otherwise clear. No pleural effusion  or pneumothorax. There old, healed rib fractures on the right. Old moderate compression fracture of a mid thoracic vertebra. The bony thorax is demineralized. IMPRESSION: No acute cardiopulmonary disease. Electronically Signed   By: Lajean Manes M.D.   On: 07/10/2016 15:46   Ct Chest Wo Contrast  Result Date: 07/10/2016 CLINICAL DATA:  Fever chills and cough, history of bronchiectasis and pneumonia EXAM: CT CHEST WITHOUT CONTRAST TECHNIQUE: Multidetector CT imaging of the chest was performed following the standard protocol without IV contrast. COMPARISON:  Radiograph 07/10/2016, CT chest 05/07/2016 FINDINGS: Cardiovascular: Limited without intravenous contrast. Aortic atherosclerosis. Coronary artery calcifications. Normal heart size. Non aneurysmal aorta. No significant pericardial effusion Mediastinum/Nodes: Subcentimeter nonspecific mediastinal lymph nodes. Midline trachea. No thyroid mass. Esophagus within normal limits. Slight increased periaortic lymph nodes in the distal mediastinum. Lungs/Pleura: Minimal subpleural fibrosis. 5 mm right lower lobe subpleural pulmonary nodule. Bronchiectasis in the lower lobes with mild mucoid impaction subpleural right lower lobe. Interim development of hazy and linear densities in the left lower lobe with small foci of consolidation suspicious for pneumonia. Upper Abdomen: Fatty infiltration of the pancreas. Surgical clips in the gallbladder fossa. Nodular liver contour. Musculoskeletal: Stable mild to moderate compression of T7. Stable mild superior endplate compression of T4. Old right fifth rib fracture. IMPRESSION: 1. Bronchiectasis in the bilateral lower lobes with mild mucoid impaction in the right lower lobe posteriorly. Development of hazy and  streaky density in the left lower lobe with small areas of consolidation, suspicious for infection/pneumonia. 2. Mild subpleural fibrosis. Electronically Signed   By: Donavan Foil M.D.   On: 07/10/2016 19:42    Procedures Procedures (including critical care time)  CRITICAL CARE Performed by: Gwenyth Allegra Huel [facility] Total critical care time: 35 minutes Critical care time was exclusive of separately billable procedures and treating other patients. Critical care was necessary to treat or prevent imminent or life-threatening deterioration. Pneumonia requiring oxygen, multiple antibiotics and admission.  Critical care was time spent personally by me on the following activities: development of treatment plan with patient and/or surrogate as well as nursing, discussions with consultants, evaluation of patient's response to treatment, examination of patient, obtaining history from patient or surrogate, ordering and performing treatments and interventions, ordering and review of laboratory studies, ordering and review of radiographic studies, pulse oximetry and re-evaluation of patient's condition.   Medications Ordered in ED Medications - No data to display   Initial Impression / Assessment and Plan / ED Course  I have reviewed the triage vital signs and the nursing notes.  Pertinent labs & imaging results that were available during my care of the patient were reviewed by me and considered in my medical decision making (see chart for details).     Tommy Santiago is a 71 y.o. male with a past medical history significant for CAD with PCI, hypertension, hepatitis C, and recurrent right-sided pneumonia who presents with fever, cough, right flank pain, and chills. Patient reports that he is feeling similar to what he felt when he had pneumonia in the past. He reports associated chills and diaphoresis. Patient says that he had a recent dental procedure this morning where he had a temporary filling  placed. He denies any difficulties with his jaw. He says that today, he began having worsening chills and fatigue probably have to take Motrin and leg down. He said he woke up with continued chills and worsening cough. He says it feels similar to the 2 episodes of pneumonia he had in the last few months.  Patient describes his right side/flank pain as a 5 out of 10 and sharp in severity.  Patient's vital signs arrival showed tachycardia, borderline tachypnea, and soft blood pressures. Next  Patient's exam revealed crackles in the right base. Patient's lungs had no other wheezing. No murmurs. No abdominal tenderness. No CVA tenderness.  Patient will have laboratory testing and imaging to look for possible pneumonia. Patient we given fluids for his vital sign abnormalities.  Anticipate reassessment following workup.  Diagnostic lab testing showed slight elevation lactic acid. Leukocytosis present. Chest x-ray shows no pneumonia however, clinically with the patient's course breath sounds, fever, cough, and leukocytosis, there still concern for pneumonia. CT scan ordered revealing pneumonia.  Patient required 2 L nasal cannula for hypoxia in the 80s. Patient's pressures continue to remain in the low 190s despite the patient.  Given these findings, patient will be treated for pneumonia and admitted to the hospital.  Patient admitted to hospitalist service for further management.   Final Clinical Impressions(s) / ED Diagnoses   Final diagnoses:  Community acquired pneumonia, unspecified laterality    New Prescriptions New Prescriptions   No medications on file    Clinical Impression: 1. Community acquired pneumonia, unspecified laterality     Disposition: Admit to Hospitalist service    Naquan Garman, Gwenyth Allegra, MD 07/11/16 1218

## 2016-07-10 NOTE — ED Notes (Signed)
ED Provider at bedside. 

## 2016-07-10 NOTE — ED Notes (Signed)
Pt with hx of Bronchiectasis sees Dr. Spero Curb Pulmonologist. Reports chills and sweating. Fatigue and cough, has had a fever of 100  PTA 1000mg  Motrin

## 2016-07-10 NOTE — ED Notes (Signed)
Patient transported to X-ray 

## 2016-07-11 ENCOUNTER — Encounter (HOSPITAL_COMMUNITY): Payer: Self-pay | Admitting: Internal Medicine

## 2016-07-11 DIAGNOSIS — J189 Pneumonia, unspecified organism: Secondary | ICD-10-CM

## 2016-07-11 DIAGNOSIS — J471 Bronchiectasis with (acute) exacerbation: Secondary | ICD-10-CM

## 2016-07-11 DIAGNOSIS — I10 Essential (primary) hypertension: Secondary | ICD-10-CM

## 2016-07-11 DIAGNOSIS — R739 Hyperglycemia, unspecified: Secondary | ICD-10-CM | POA: Diagnosis present

## 2016-07-11 DIAGNOSIS — A419 Sepsis, unspecified organism: Secondary | ICD-10-CM | POA: Diagnosis present

## 2016-07-11 LAB — CBC WITH DIFFERENTIAL/PLATELET
BASOS ABS: 0 10*3/uL (ref 0.0–0.1)
Basophils Relative: 0 %
EOS PCT: 0 %
Eosinophils Absolute: 0 10*3/uL (ref 0.0–0.7)
HEMATOCRIT: 37.5 % — AB (ref 39.0–52.0)
HEMOGLOBIN: 13.1 g/dL (ref 13.0–17.0)
LYMPHS ABS: 0.7 10*3/uL (ref 0.7–4.0)
LYMPHS PCT: 9 %
MCH: 33.4 pg (ref 26.0–34.0)
MCHC: 34.9 g/dL (ref 30.0–36.0)
MCV: 95.7 fL (ref 78.0–100.0)
Monocytes Absolute: 0.9 10*3/uL (ref 0.1–1.0)
Monocytes Relative: 11 %
NEUTROS ABS: 6.1 10*3/uL (ref 1.7–7.7)
NEUTROS PCT: 80 %
PLATELETS: 245 10*3/uL (ref 150–400)
RBC: 3.92 MIL/uL — ABNORMAL LOW (ref 4.22–5.81)
RDW: 12.7 % (ref 11.5–15.5)
WBC: 7.8 10*3/uL (ref 4.0–10.5)

## 2016-07-11 LAB — BASIC METABOLIC PANEL
ANION GAP: 9 (ref 5–15)
BUN: 15 mg/dL (ref 6–20)
CHLORIDE: 102 mmol/L (ref 101–111)
CO2: 24 mmol/L (ref 22–32)
Calcium: 8.1 mg/dL — ABNORMAL LOW (ref 8.9–10.3)
Creatinine, Ser: 0.87 mg/dL (ref 0.61–1.24)
Glucose, Bld: 98 mg/dL (ref 65–99)
POTASSIUM: 3.3 mmol/L — AB (ref 3.5–5.1)
SODIUM: 135 mmol/L (ref 135–145)

## 2016-07-11 LAB — RAPID URINE DRUG SCREEN, HOSP PERFORMED
AMPHETAMINES: NOT DETECTED
BENZODIAZEPINES: NOT DETECTED
Barbiturates: NOT DETECTED
Cocaine: NOT DETECTED
Opiates: NOT DETECTED
TETRAHYDROCANNABINOL: NOT DETECTED

## 2016-07-11 LAB — LACTIC ACID, PLASMA
LACTIC ACID, VENOUS: 1.6 mmol/L (ref 0.5–1.9)
Lactic Acid, Venous: 1.9 mmol/L (ref 0.5–1.9)

## 2016-07-11 LAB — PROTIME-INR
INR: 1.41
Prothrombin Time: 17.3 seconds — ABNORMAL HIGH (ref 11.4–15.2)

## 2016-07-11 LAB — STREP PNEUMONIAE URINARY ANTIGEN: STREP PNEUMO URINARY ANTIGEN: NEGATIVE

## 2016-07-11 LAB — PROCALCITONIN: Procalcitonin: 0.18 ng/mL

## 2016-07-11 LAB — APTT: aPTT: 33 seconds (ref 24–36)

## 2016-07-11 LAB — INFLUENZA PANEL BY PCR (TYPE A & B)
Influenza A By PCR: NEGATIVE
Influenza B By PCR: NEGATIVE

## 2016-07-11 MED ORDER — ALBUTEROL SULFATE (2.5 MG/3ML) 0.083% IN NEBU
2.5000 mg | INHALATION_SOLUTION | RESPIRATORY_TRACT | Status: DC | PRN
  Administered 2016-07-12 (×2): 2.5 mg via RESPIRATORY_TRACT
  Filled 2016-07-11 (×3): qty 3

## 2016-07-11 MED ORDER — DEXTROSE 5 % IV SOLN
1.0000 g | INTRAVENOUS | Status: DC
  Administered 2016-07-11: 1 g via INTRAVENOUS
  Filled 2016-07-11 (×2): qty 10

## 2016-07-11 MED ORDER — PANTOPRAZOLE SODIUM 40 MG PO TBEC
40.0000 mg | DELAYED_RELEASE_TABLET | Freq: Every day | ORAL | Status: DC
  Administered 2016-07-11 – 2016-07-12 (×2): 40 mg via ORAL
  Filled 2016-07-11 (×2): qty 1

## 2016-07-11 MED ORDER — BUPROPION HCL ER (XL) 150 MG PO TB24
150.0000 mg | ORAL_TABLET | Freq: Every day | ORAL | Status: DC
  Administered 2016-07-11 – 2016-07-12 (×2): 150 mg via ORAL
  Filled 2016-07-11 (×2): qty 1

## 2016-07-11 MED ORDER — IRBESARTAN 150 MG PO TABS
150.0000 mg | ORAL_TABLET | Freq: Every day | ORAL | Status: DC
  Administered 2016-07-11 – 2016-07-12 (×2): 150 mg via ORAL
  Filled 2016-07-11 (×2): qty 1

## 2016-07-11 MED ORDER — DEXTROSE 5 % IV SOLN
500.0000 mg | Freq: Every day | INTRAVENOUS | Status: DC
  Administered 2016-07-11 (×2): 500 mg via INTRAVENOUS
  Filled 2016-07-11 (×2): qty 500

## 2016-07-11 MED ORDER — ATORVASTATIN CALCIUM 10 MG PO TABS
10.0000 mg | ORAL_TABLET | Freq: Every day | ORAL | Status: DC
  Administered 2016-07-11: 10 mg via ORAL
  Filled 2016-07-11: qty 1

## 2016-07-11 MED ORDER — SODIUM CHLORIDE 0.9 % IV SOLN
INTRAVENOUS | Status: DC
  Administered 2016-07-11 (×2): via INTRAVENOUS

## 2016-07-11 MED ORDER — PSYLLIUM 95 % PO PACK
1.0000 | PACK | Freq: Every day | ORAL | Status: DC
  Administered 2016-07-11: 1 via ORAL
  Filled 2016-07-11 (×2): qty 1

## 2016-07-11 MED ORDER — ASPIRIN 325 MG PO TABS
325.0000 mg | ORAL_TABLET | Freq: Every day | ORAL | Status: DC
  Administered 2016-07-11 – 2016-07-12 (×2): 325 mg via ORAL
  Filled 2016-07-11 (×2): qty 1

## 2016-07-11 MED ORDER — SIMETHICONE 80 MG PO CHEW
80.0000 mg | CHEWABLE_TABLET | Freq: Four times a day (QID) | ORAL | Status: DC | PRN
  Administered 2016-07-11 – 2016-07-12 (×3): 80 mg via ORAL
  Filled 2016-07-11 (×3): qty 1

## 2016-07-11 MED ORDER — ENOXAPARIN SODIUM 40 MG/0.4ML ~~LOC~~ SOLN
40.0000 mg | SUBCUTANEOUS | Status: DC
  Administered 2016-07-11: 40 mg via SUBCUTANEOUS
  Filled 2016-07-11: qty 0.4

## 2016-07-11 NOTE — Progress Notes (Signed)
Per CCMD pt had an 11 beat run of v-tach.  Asymptomatic.  MD made aware.

## 2016-07-11 NOTE — H&P (Signed)
History and Physical    Tommy Santiago YYT:[id number] DOB: 30-Nov-1945 DOA: 07/10/2016  PCP: Bernerd Limbo, MD Consultants:  [facility] - pulmonology; [facility] - cardiology; [facility] -GI Patient coming from: home - lives with wife; NOK: wife, [phone]  Chief Complaint: congestion  HPI: Tommy Santiago is a 71 y.o. male with medical history significant of bronchiectasis (new diagnosis), h/o PNA, h/o Hep C, HTN presenting with chest congestion.  He thinks he overexerted himself doing yard work.  This AM, he had a dental appointment and didn't feel very well.  Chest congestion, gags easily.  Had some gagging during procedure.  Took AM pills after returning home.  Didn't feel well at all.  Started feeling "kind of shocky, very chilled".  Bundled up and slept maybe 2 hours.  Still wasn't feeling well and took 3 Motrin.  Again with chest congestion.  Went back to bed and started feeling "muddle headed".  Temp 100.6.  Went to [facility] for evaluation and determined to have PNA.  While in triage, diffuse diaphoresis, felt pre-syncopal.  Very weak, SOB.  +myalgias.  Developed abdominal pain like gas cramps.  +n/v.    Has chronic bronchiectasis diagnosed in 2/18.  Prior h/o what was diagnosed as PNA in 10/17.  Had a high-resolution CT for diagnosis.  Uses a flutter valve but told whenever he is ill then he needs to be seen quickly.   ED Course: Tachycardia, borderline tachypnea, soft BPs, Crackles in RLL, concern for PNA  Review of Systems: As per HPI; otherwise review of systems reviewed and negative.   Ambulatory Status:  Ambulates without difficulty  Past Medical History:  Diagnosis Date  . Bronchiectasis ([place])   . Coronary atherosclerosis of unspecified type of vessel, native or graft    status post cardiac cath w percutaneous coronary intervention using XIENCE drug-eluting stent to mid left anterior descending 07/23/08; LHC 08/07/11: Mid LAD stent patent, moderate size diagonal branch jailed by stent  with 70% ostial stenosis, unchanged from 2010 with excellent flow down the diagonal branch, mid circumflex 30%, proximal and mid RCA 20%, EF 60%.  Medical therapy continued.   . Hepatitis C    Treated [place]  . Hypertension   . Hypopotassemia   . Lichen planus   . Other and unspecified hyperlipidemia   . Other specified cardiac dysrhythmias(427.89)   . Paroxysmal supraventricular tachycardia ([place])   . Personal history of other infectious and parasitic disease   . Plantar fascial fibromatosis   . Syncope and collapse     Past Surgical History:  Procedure Laterality Date  . CHOLECYSTECTOMY    . colonscopy     status post  . CORONARY ANGIOPLASTY WITH STENT PLACEMENT    . ESOPHAGOGASTRODUODENOSCOPY     status post  . KNEE ARTHROSCOPY    . LEFT HEART CATHETERIZATION WITH CORONARY ANGIOGRAM  08/07/2011   Procedure: LEFT HEART CATHETERIZATION WITH CORONARY ANGIOGRAM;  Surgeon: Burnell Blanks, MD;  Location[facility] CATH LAB;  Service: Cardiovascular;;    Social History   Social History  . Marital status: Married    Spouse name: N/A  . Number of children: N/A  . Years of education: N/A   Occupational History  . Critical care RN    Social History Main Topics  . Smoking status: Former Smoker    Packs/day: 2.00    Years: 25.00    Quit date: 1989  . Smokeless tobacco: Never Used  . Alcohol use Yes     Comment: 1+ beer per  night, had 4 last night  . Drug use: No     Comment: quit 25 years ago  . Sexual activity: Not on file   Other Topics Concern  . Not on file   Social History Narrative  . No narrative on file    Allergies  Allergen Reactions  . Amlodipine Swelling    Family History  Problem Relation Age of Onset  . Coronary artery disease Brother        diagnosed in early 59s  . Coronary artery disease Other        hx family    Prior to Admission medications   Medication Sig Start Date End Date Taking? Authorizing Provider  aspirin 325 MG tablet Take  325 mg by mouth daily.   Yes [provider]  atorvastatin (LIPITOR) 10 MG tablet Take 10 mg by mouth daily. 03/28/16 03/28/17 Yes [provider]  B Complex Vitamins (VITAMIN B COMPLEX PO) Take 1 tablet by mouth daily.   Yes [provider]  buPROPion (WELLBUTRIN XL) 150 MG 24 hr tablet Take 150 mg by mouth daily.    Yes [provider]  Calcium Carbonate-Vitamin D (CALCIUM 600/VITAMIN D) 600-400 MG-UNIT per tablet Take 1 tablet by mouth 2 (two) times daily.     Yes [provider]  hydrochlorothiazide (HYDRODIURIL) 25 MG tablet Take 25 mg by mouth daily.   Yes [provider]  Melatonin 5 MG TABS Take by mouth.   Yes [provider]  omeprazole (PRILOSEC) 20 MG capsule Take 20 mg by mouth as needed. Patient uses this medication for acid reflux.   Yes [provider]  psyllium (METAMUCIL) 58.6 % powder Take 1 packet by mouth daily. Patient uses this mediation for regularity.   Yes [provider]  tadalafil (CIALIS) 20 MG tablet Take 20 mg by mouth daily as needed. Patient uses this medication for ED.   Yes [provider]  tetrahydrozoline-zinc (VISINE-AC) 0.05-0.25 % ophthalmic solution Place 2 drops into both eyes 3 (three) times daily as needed.   Yes [provider]  valsartan (DIOVAN) 160 MG tablet Take 160 mg by mouth daily.     Yes [provider]  vitamin E 400 UNIT capsule Take 400 Units by mouth daily.   Yes [provider]  nitroGLYCERIN (NITROSTAT) 0.4 MG SL tablet Place 1 tablet (0.4 mg total) under the tongue every 5 (five) minutes as needed for chest pain. 08/07/11 07/29/13  Barrett, Evelene Croon, PA-C  Respiratory Therapy Supplies (FLUTTER) DEVI Use as directed 05/08/16   Juanito Doom, MD    Physical Exam: Vitals:   07/10/16 1800 07/10/16 1823 07/10/16 2009 07/10/16 2100  BP: 109/81  94/69 105/71  Pulse: 88  85 90  Resp: (!) 24  (!) 22 (!) 21  Temp:  98.9 F (37.2  C) 98.8 F (37.1 C)   TempSrc:  Oral    SpO2: 93%  92% 91%  Weight:      Height:         General:  Appears calm and comfortable and is NAD.  Has a prominent beard. Eyes:  PERRL, EOMI, normal lids, iris ENT:  grossly normal hearing, lips & tongue, mmm Neck:  no LAD, masses or thyromegaly Cardiovascular:  RRR, no m/r/g. No LE edema.  Respiratory:  CTA bilaterally, no w/r/r. Normal respiratory effort. Abdomen:  soft, ntnd, NABS Skin:  no rash or induration seen on limited exam.  Diffuse tattoos covering most body surfaces. Musculoskeletal:  grossly normal tone BUE/BLE, good ROM, no bony abnormality Psychiatric:  grossly normal mood and affect, speech fluent and appropriate, AOx3 Neurologic:  CN 2-12 grossly intact, moves all extremities in coordinated fashion, sensation intact  Labs on Admission: I have personally reviewed following labs and imaging studies  CBC:  Recent Labs Lab 07/10/16 1528  WBC 13.5*  NEUTROABS 11.4*  HGB 15.8  HCT 44.3  MCV 95.1  PLT 767   Basic Metabolic Panel:  Recent Labs Lab 07/10/16 1528  NA 132*  K 3.5  CL 98*  CO2 20*  GLUCOSE 138*  BUN 17  CREATININE 0.99  CALCIUM 8.9   GFR: Estimated Creatinine Clearance: 85.7 mL/min (by C-G formula based on SCr of 0.99 mg/dL). Liver Function Tests:  Recent Labs Lab 07/10/16 1528  AST 25  ALT 20  ALKPHOS 52  BILITOT 1.1  PROT 7.0  ALBUMIN 4.2    Recent Labs Lab 07/10/16 1528  LIPASE 13   No results for input(s): AMMONIA in the last 168 hours. Coagulation Profile: No results for input(s): INR, PROTIME in the last 168 hours. Cardiac Enzymes: No results for input(s): CKTOTAL, CKMB, CKMBINDEX, TROPONINI in the last 168 hours. BNP (last 3 results) No results for input(s): PROBNP in the last 8760 hours. HbA1C: No results for input(s): HGBA1C in the last 72 hours. CBG: No results for input(s): GLUCAP in the last 168 hours. Lipid Profile: No results for input(s): CHOL, HDL, LDLCALC,  TRIG, CHOLHDL, LDLDIRECT in the last 72 hours. Thyroid Function Tests: No results for input(s): TSH, T4TOTAL, FREET4, T3FREE, THYROIDAB in the last 72 hours. Anemia Panel: No results for input(s): VITAMINB12, FOLATE, FERRITIN, TIBC, IRON, RETICCTPCT in the last 72 hours. Urine analysis:    Component Value Date/Time   COLORURINE AMBER (A) 07/10/2016 1618   APPEARANCEUR CLOUDY (A) 07/10/2016 1618   LABSPEC 1.027 07/10/2016 1618   PHURINE 5.5 07/10/2016 1618   GLUCOSEU NEGATIVE 07/10/2016 1618   HGBUR NEGATIVE 07/10/2016 1618   BILIRUBINUR SMALL (A) 07/10/2016 1618   KETONESUR 15 (A) 07/10/2016 1618   PROTEINUR NEGATIVE 07/10/2016 1618   UROBILINOGEN 1.0 06/23/2008 0813   NITRITE NEGATIVE 07/10/2016 1618   LEUKOCYTESUR TRACE (A) 07/10/2016 1618    Creatinine Clearance: Estimated Creatinine Clearance: 85.7 mL/min (by C-G formula based on SCr of 0.99 mg/dL).  Sepsis Labs: @LABRCNTIP (procalcitonin:4,lacticidven:4) )No results found for this or any previous visit (from the past 240 hour(s)).   Radiological Exams on Admission: Dg Chest 2 View  Result Date: 07/10/2016 CLINICAL DATA:  Weakness since this am, sob on exertion, cough. Nonsmoker. EXAM: CHEST  2 VIEW COMPARISON:  04/21/2016 FINDINGS: Cardiac silhouette is normal in size. No mediastinal or hilar masses. No evidence of adenopathy. Stable scarring noted at both lung bases.  Lungs otherwise clear. No pleural effusion or pneumothorax. There old, healed rib fractures on the right. Old moderate compression fracture of a mid thoracic vertebra. The bony thorax is demineralized. IMPRESSION: No acute cardiopulmonary disease. Electronically Signed   By: Lajean Manes M.D.   On: 07/10/2016 15:46   Ct Chest Wo Contrast  Result Date: 07/10/2016 CLINICAL DATA:  Fever chills and cough, history of bronchiectasis and pneumonia EXAM: CT CHEST WITHOUT CONTRAST TECHNIQUE: Multidetector CT imaging of the chest was performed following the standard  protocol without IV contrast. COMPARISON:  Radiograph 07/10/2016, CT chest 05/07/2016 FINDINGS: Cardiovascular: Limited without intravenous contrast. Aortic atherosclerosis. Coronary artery calcifications. Normal heart size. Non aneurysmal aorta. No significant pericardial effusion Mediastinum/Nodes: Subcentimeter nonspecific mediastinal lymph nodes. Midline  trachea. No thyroid mass. Esophagus within normal limits. Slight increased periaortic lymph nodes in the distal mediastinum. Lungs/Pleura: Minimal subpleural fibrosis. 5 mm right lower lobe subpleural pulmonary nodule. Bronchiectasis in the lower lobes with mild mucoid impaction subpleural right lower lobe. Interim development of hazy and linear densities in the left lower lobe with small foci of consolidation suspicious for pneumonia. Upper Abdomen: Fatty infiltration of the pancreas. Surgical clips in the gallbladder fossa. Nodular liver contour. Musculoskeletal: Stable mild to moderate compression of T7. Stable mild superior endplate compression of T4. Old right fifth rib fracture. IMPRESSION: 1. Bronchiectasis in the bilateral lower lobes with mild mucoid impaction in the right lower lobe posteriorly. Development of hazy and streaky density in the left lower lobe with small areas of consolidation, suspicious for infection/pneumonia. 2. Mild subpleural fibrosis. Electronically Signed   By: Donavan Foil M.D.   On: 07/10/2016 19:42    EKG: Independently reviewed.  Sinus tachycardia with rate 114; PVCs with no evidence of acute ischemia  Assessment/Plan Principal Problem:   CAP (community acquired pneumonia) Active Problems:   Essential hypertension   Bronchiectasis ([place])   Hyperglycemia   Sepsis ([place])   Sepsis due to CAP in the setting of chronic bronchiectasis -Elevated WBC count (13.5), tachycardia, tachypnea with elevated lactate to 2.01, 1.99 and borderline hypotension -While awaiting blood cultures, this appears to be a preseptic  condition. -Sepsis protocol initiated;did not receive full IVF bolus due to lactate <4 -UA: few bacteria, 15 ketones, trace LE -Given productive cough, mildly decreased oxygen saturation, and infiltrates on chest x-ray, most likely community-acquired pneumonia.  -Influenza pending. -CURB-65 score is 1 - will admit the patient to Med Surg; estimated mortality risk is about 2%. -Will start Azithromycin 500 mg daily AND Rocephin due to no risk factors for MDR cause. -NS @ 75cc/hr -Fever control -Repeat CBC in am -Sputum cultures -Blood cultures -Strep pneumo testing -Supplemental O2 prn -Will order procalcitonin level.  >0.5 indicates infection and >>0.5 indicates more serious disease.  As the procalcitonin level normalizes, it will be reasonable to consider de-escalation of antibiotic coverage. -albuterol PRN -Blood and urine cultures pending -Will admit and continue to monitor; initial plan at the time of transfer was for telemetry, but the patient is no longer tachycardic and does not need telemetry at this time. -Will add HIV -Will trend lactate to ensure improvement -With his h/o bronchiectasis, if he is not improving with standard treatment then he will need pulmonology consultation.  Hyperglycemia -Glucose 138 -May be stress response -Will follow with fasting AM labs  HTN -Continue HCTZ, Diovan    DVT prophylaxis:  Lovenox  Code Status:  Full - confirmed with patient Family Communication: None present Disposition Plan:  Home once clinically improved Consults called: None  Admission status: Admit - It is my clinical opinion that admission to INPATIENT is reasonable and necessary because this patient will require at least 2 midnights in the hospital to treat this condition based on the medical complexity of the problems presented.  Given the aforementioned information, the predictability of an adverse outcome is felt to be significant.    Karmen Bongo MD [facility]  If 7PM-7AM, please contact night-coverage [email or web address] Password [facility]  07/11/2016, 12:42 AM

## 2016-07-11 NOTE — Progress Notes (Signed)
Pharmacy Antibiotic Note  Tommy Santiago is a 71 y.o. male admitted on 07/10/2016 with pneumonia.  Pharmacy has been consulted for rocephin and zmax dosing.  Plan: Rocephin 1 Gm IV q24h Zmax 500 mg IV q24h Rx will sign off as no further adjustments are needed  Height: 5\' 11"  (180.3 cm) Weight: 225 lb (102.1 kg) IBW/kg (Calculated) : 75.3  Temp (24hrs), Avg:98.7 F (37.1 C), Min:98.4 F (36.9 C), Max:98.9 F (37.2 C)   Recent Labs Lab 07/10/16 1528 07/10/16 1548 07/10/16 2015  WBC 13.5*  --   --   CREATININE 0.99  --   --   LATICACIDVEN  --  2.01* 1.99*    Estimated Creatinine Clearance: 85.7 mL/min (by C-G formula based on SCr of 0.99 mg/dL).    Allergies  Allergen Reactions  . Amlodipine Swelling    Antimicrobials this admission: 5/17 rocephin >> 3 5/18 zmax >>   Dose adjustments this admission:   Microbiology results:  BCx:   UCx:    Sputum:    MRSA PCR:   Thank you for allowing pharmacy to be a part of this patient's care.  Dorrene German 07/11/2016 1:22 AM

## 2016-07-11 NOTE — Progress Notes (Signed)
Patient seen and examined. Database reviewed. Discussed with wife at bedside and all questions answered. Admitted earlier today due to SOB and congestion. Recent diagnosis of bronchiectasis and appears to have an acute flare. Agree with continued abx therapy, wean oxygen. Had some NSVT earlier today, 11 beats, asymptomatic. States he had a recent ECHO out of hospital and was told his "heart pump function was fine". Does not want to have a repeat ECHO. No need for further w/u. OK to DC tele.  Domingo Mend, MD [facility] Pager: [phone]

## 2016-07-12 DIAGNOSIS — J479 Bronchiectasis, uncomplicated: Secondary | ICD-10-CM

## 2016-07-12 LAB — HIV ANTIBODY (ROUTINE TESTING W REFLEX): HIV Screen 4th Generation wRfx: NONREACTIVE

## 2016-07-12 MED ORDER — AMOXICILLIN-POT CLAVULANATE 875-125 MG PO TABS: 1.0000 | ORAL_TABLET | Freq: Two times a day (BID) | ORAL | 0 refills | Status: AC

## 2016-07-12 MED ORDER — AZITHROMYCIN 250 MG PO TABS: 500.0000 mg | ORAL_TABLET | ORAL | Status: DC

## 2016-07-12 MED ORDER — OXYCODONE HCL 5 MG PO TABS
5.0000 mg | ORAL_TABLET | Freq: Four times a day (QID) | ORAL | Status: DC | PRN
  Administered 2016-07-12: 5 mg via ORAL
  Filled 2016-07-12: qty 1

## 2016-07-12 NOTE — Discharge Summary (Signed)
Physician Discharge Summary  Tommy Santiago BPZ:[id number] DOB: 04/02/1945 DOA: 07/10/2016  PCP: Bernerd Limbo, MD  Admit date: 07/10/2016 Discharge date: 07/12/2016  Time spent: 45 minutes  Recommendations for Outpatient Follow-up:  -Will be discharged home today. -Advised to follow up with PCP in 2 weeks.   Discharge Diagnoses:  Principal Problem:   CAP (community acquired pneumonia) Active Problems:   Essential hypertension   Bronchiectasis ([place])   Hyperglycemia   Sepsis ([place])   Discharge Condition: Stable and improved  Filed Weights   07/10/16 1451  Weight: 102.1 kg (225 lb)    History of present illness:  As per Dr. Lorin Mercy on 5/18: Tommy Santiago is a 71 y.o. male with medical history significant of bronchiectasis (new diagnosis), h/o PNA, h/o Hep C, HTN presenting with chest congestion.  He thinks he overexerted himself doing yard work.  This AM, he had a dental appointment and didn't feel very well.  Chest congestion, gags easily.  Had some gagging during procedure.  Took AM pills after returning home.  Didn't feel well at all.  Started feeling "kind of shocky, very chilled".  Bundled up and slept maybe 2 hours.  Still wasn't feeling well and took 3 Motrin.  Again with chest congestion.  Went back to bed and started feeling "muddle headed".  Temp 100.6.  Went to [facility] for evaluation and determined to have PNA.  While in triage, diffuse diaphoresis, felt pre-syncopal.  Very weak, SOB.  +myalgias.  Developed abdominal pain like gas cramps.  +n/v.    Has chronic bronchiectasis diagnosed in 2/18.  Prior h/o what was diagnosed as PNA in 10/17.  Had a high-resolution CT for diagnosis.  Uses a flutter valve but told whenever he is ill then he needs to be seen quickly.   ED Course: Tachycardia, borderline tachypnea, soft BPs, Crackles in RLL, concern for PNA  Hospital Course:   Sepsis due to Bronchiectasis with Acute Flare -Clinically improved. -No oxygen  requirement. -Will DC on augmentin to complete a 10 day course. -All sepsis parameters have resolved.  HTN -Well controlled on home medications.  Procedures:  None   Consultations:  None  Discharge Instructions  Discharge Instructions    Diet - low sodium heart healthy    Complete by:  As directed    Increase activity slowly    Complete by:  As directed      Allergies as of 07/12/2016      Reactions   Amlodipine Swelling      Medication List    STOP taking these medications   nitroGLYCERIN 0.4 MG SL tablet Commonly known as:  NITROSTAT     TAKE these medications   amoxicillin-clavulanate 875-125 MG tablet Commonly known as:  AUGMENTIN Take 1 tablet by mouth 2 (two) times daily.   aspirin 325 MG tablet Take 325 mg by mouth daily.   atorvastatin 10 MG tablet Commonly known as:  LIPITOR Take 10 mg by mouth daily.   buPROPion 150 MG 24 hr tablet Commonly known as:  WELLBUTRIN XL Take 150 mg by mouth daily.   CALCIUM 600/VITAMIN D 600-400 MG-UNIT tablet Generic drug:  Calcium Carbonate-Vitamin D Take 1 tablet by mouth 2 (two) times daily.   CIALIS 20 MG tablet Generic drug:  tadalafil Take 20 mg by mouth daily as needed. Patient uses this medication for ED.   DIOVAN 160 MG tablet Generic drug:  valsartan Take 160 mg by mouth daily.   FLUTTER Devi Use as directed  hydrochlorothiazide 25 MG tablet Commonly known as:  HYDRODIURIL Take 25 mg by mouth daily.   Melatonin 5 MG Tabs Take by mouth.   omeprazole 20 MG capsule Commonly known as:  PRILOSEC Take 20 mg by mouth as needed. Patient uses this medication for acid reflux.   psyllium 58.6 % powder Commonly known as:  METAMUCIL Take 1 packet by mouth daily. Patient uses this mediation for regularity.   tetrahydrozoline-zinc 0.05-0.25 % ophthalmic solution Commonly known as:  VISINE-AC Place 2 drops into both eyes 3 (three) times daily as needed.   VITAMIN B COMPLEX PO Take 1 tablet by  mouth daily.   vitamin E 400 UNIT capsule Take 400 Units by mouth daily.      Allergies  Allergen Reactions  . Amlodipine Swelling   Follow-up Information    Bernerd Limbo, MD. Schedule an appointment as soon as possible for a visit in 2 week(s).   Specialty:  Family Medicine Contact information: [place] [address] [phone]            The results of significant diagnostics from this hospitalization (including imaging, microbiology, ancillary and laboratory) are listed below for reference.    Significant Diagnostic Studies: Dg Chest 2 View  Result Date: 07/10/2016 CLINICAL DATA:  Weakness since this am, sob on exertion, cough. Nonsmoker. EXAM: CHEST  2 VIEW COMPARISON:  04/21/2016 FINDINGS: Cardiac silhouette is normal in size. No mediastinal or hilar masses. No evidence of adenopathy. Stable scarring noted at both lung bases.  Lungs otherwise clear. No pleural effusion or pneumothorax. There old, healed rib fractures on the right. Old moderate compression fracture of a mid thoracic vertebra. The bony thorax is demineralized. IMPRESSION: No acute cardiopulmonary disease. Electronically Signed   By: Lajean Manes M.D.   On: 07/10/2016 15:46   Ct Chest Wo Contrast  Result Date: 07/10/2016 CLINICAL DATA:  Fever chills and cough, history of bronchiectasis and pneumonia EXAM: CT CHEST WITHOUT CONTRAST TECHNIQUE: Multidetector CT imaging of the chest was performed following the standard protocol without IV contrast. COMPARISON:  Radiograph 07/10/2016, CT chest 05/07/2016 FINDINGS: Cardiovascular: Limited without intravenous contrast. Aortic atherosclerosis. Coronary artery calcifications. Normal heart size. Non aneurysmal aorta. No significant pericardial effusion Mediastinum/Nodes: Subcentimeter nonspecific mediastinal lymph nodes. Midline trachea. No thyroid mass. Esophagus within normal limits. Slight increased periaortic lymph nodes in the distal  mediastinum. Lungs/Pleura: Minimal subpleural fibrosis. 5 mm right lower lobe subpleural pulmonary nodule. Bronchiectasis in the lower lobes with mild mucoid impaction subpleural right lower lobe. Interim development of hazy and linear densities in the left lower lobe with small foci of consolidation suspicious for pneumonia. Upper Abdomen: Fatty infiltration of the pancreas. Surgical clips in the gallbladder fossa. Nodular liver contour. Musculoskeletal: Stable mild to moderate compression of T7. Stable mild superior endplate compression of T4. Old right fifth rib fracture. IMPRESSION: 1. Bronchiectasis in the bilateral lower lobes with mild mucoid impaction in the right lower lobe posteriorly. Development of hazy and streaky density in the left lower lobe with small areas of consolidation, suspicious for infection/pneumonia. 2. Mild subpleural fibrosis. Electronically Signed   By: Donavan Foil M.D.   On: 07/10/2016 19:42    Microbiology: Recent Results (from the past 240 hour(s))  Blood culture (routine x 2)     Status: None (Preliminary result)   Collection Time: 07/10/16  2:32 PM  Result Value Ref Range Status   Specimen Description BLOOD RIGHT ARM  Final   Special Requests   Final  BOTTLES DRAWN AEROBIC AND ANAEROBIC Blood Culture adequate volume   Culture   Final    NO GROWTH < 24 HOURS Performed at [facility], [place] [address] [place], [place] [address]    Report Status PENDING  Incomplete  Blood culture (routine x 2)     Status: None (Preliminary result)   Collection Time: 07/10/16  4:37 PM  Result Value Ref Range Status   Specimen Description BLOOD BLOOD LEFT ARM  Final   Special Requests   Final    BOTTLES DRAWN AEROBIC AND ANAEROBIC Blood Culture adequate volume   Culture   Final    NO GROWTH < 24 HOURS Performed at [facility], [place] [address] [place], [place] [address]    Report Status PENDING  Incomplete     Labs: Basic Metabolic Panel:  Recent Labs Lab  07/10/16 1528 07/11/16 0344  NA 132* 135  K 3.5 3.3*  CL 98* 102  CO2 20* 24  GLUCOSE 138* 98  BUN 17 15  CREATININE 0.99 0.87  CALCIUM 8.9 8.1*   Liver Function Tests:  Recent Labs Lab 07/10/16 1528  AST 25  ALT 20  ALKPHOS 52  BILITOT 1.1  PROT 7.0  ALBUMIN 4.2    Recent Labs Lab 07/10/16 1528  LIPASE 13   No results for input(s): AMMONIA in the last 168 hours. CBC:  Recent Labs Lab 07/10/16 1528 07/11/16 0344  WBC 13.5* 7.8  NEUTROABS 11.4* 6.1  HGB 15.8 13.1  HCT 44.3 37.5*  MCV 95.1 95.7  PLT 297 245   Cardiac Enzymes: No results for input(s): CKTOTAL, CKMB, CKMBINDEX, TROPONINI in the last 168 hours. BNP: BNP (last 3 results) No results for input(s): BNP in the last 8760 hours.  ProBNP (last 3 results) No results for input(s): PROBNP in the last 8760 hours.  CBG: No results for input(s): GLUCAP in the last 168 hours.     SignedLelon Frohlich  [facility] Pager: [phone] 07/12/2016, 11:47 AM

## 2016-07-12 NOTE — Progress Notes (Signed)
PHARMACIST - PHYSICIAN COMMUNICATION CONCERNING: Antibiotic IV to Oral Route Change Policy  RECOMMENDATION: This patient is receiving Azithromycin by the intravenous route.  Based on criteria approved by the Pharmacy and Therapeutics Committee, the antibiotic(s) is/are being converted to the equivalent oral dose form(s).   DESCRIPTION: These criteria include:  Patient being treated for a respiratory tract infection, urinary tract infection, cellulitis or clostridium difficile associated diarrhea if on metronidazole  The patient is not neutropenic and does not exhibit a GI malabsorption state  The patient is eating (either orally or via tube) and/or has been taking other orally administered medications for a least 24 hours  The patient is improving clinically and has a Tmax < 100.5  If you have questions about this conversion, please contact the Pharmacy Department  []   [phone] )  [facility] []   [phone] )  [facility] []   [phone] )  [facility] []   [phone] )  [facility] [x]   [phone] )  [place], PharmD, [place] Pager: [phone] 07/12/2016 10:55 AM

## 2016-07-14 ENCOUNTER — Telehealth: Payer: Self-pay | Admitting: Pulmonary Disease

## 2016-07-14 NOTE — Telephone Encounter (Signed)
Rec'd FMLA via fax from [employer] fwd to [employer] via interoffice mail -pr

## 2016-07-15 ENCOUNTER — Ambulatory Visit (INDEPENDENT_AMBULATORY_CARE_PROVIDER_SITE_OTHER): Payer: 59 | Admitting: Pulmonary Disease

## 2016-07-15 ENCOUNTER — Encounter: Payer: Self-pay | Admitting: Pulmonary Disease

## 2016-07-15 VITALS — BP 134/76 | HR 85 | Ht 71.0 in | Wt 225.8 lb

## 2016-07-15 DIAGNOSIS — J479 Bronchiectasis, uncomplicated: Secondary | ICD-10-CM

## 2016-07-15 DIAGNOSIS — J189 Pneumonia, unspecified organism: Secondary | ICD-10-CM

## 2016-07-15 LAB — CULTURE, BLOOD (ROUTINE X 2)
Culture: NO GROWTH
Culture: NO GROWTH
SPECIAL REQUESTS: ADEQUATE
Special Requests: ADEQUATE

## 2016-07-15 MED ORDER — ALBUTEROL SULFATE (2.5 MG/3ML) 0.083% IN NEBU: 2.5000 mg | INHALATION_SOLUTION | Freq: Four times a day (QID) | RESPIRATORY_TRACT | 11 refills | Status: DC | PRN

## 2016-07-15 MED ORDER — SODIUM CHLORIDE 3 % IN NEBU: INHALATION_SOLUTION | Freq: Two times a day (BID) | RESPIRATORY_TRACT | 11 refills | Status: DC

## 2016-07-15 MED FILL — SODIUM CHLORIDE 3% VIAL: 3 | 25 days supply | Qty: 750 | Fill #0 | Status: TO

## 2016-07-15 MED FILL — ALBUTEROL 0.083% INHAL SOLN: (2.5 MG/3ML | 28 days supply | Qty: 300 | Fill #0 | Status: TO

## 2016-07-15 NOTE — Progress Notes (Signed)
Subjective:    Patient ID: Tommy Santiago, male    DOB: February 01, 1946, 71 y.o.   MRN: [id number]  Synopsis: First evaluated in 2018 for recurrent pneumonia and bronchitis symptoms. Noted to have bronchiectasis primarily in the right lower lobe. This is felt to be related to either occult chronic aspiration (modified barium swallow negative) or perhaps the result of a prior episode of pneumonia.  HPI Chief Complaint  Patient presents with  . Follow-up    pt recently hospitalized for PNA- pt c/o weakness, sob with exertion.  denies mucus production, CP, chest tightness.     Tommy Santiago was hospitalized last week after a few days of hard work.  He had been working out in the yard and then had a dental visit for an infected tooth.  He had chills, sweats, felt weak.  He eventually ended up in the hospital because he felt so weak and confused.  He had a temp of 100.5 and a HR of 110.  He had a pre-syncopal event.  He was treated with fluids and antibiotics. He feels better now.  He still feels week.  He is coughing and doesn't fee that he can get anything out.   Past Medical History:  Diagnosis Date  . Bronchiectasis ([place])   . Coronary atherosclerosis of unspecified type of vessel, native or graft    status post cardiac cath w percutaneous coronary intervention using XIENCE drug-eluting stent to mid left anterior descending 07/23/08; LHC 08/07/11: Mid LAD stent patent, moderate size diagonal branch jailed by stent with 70% ostial stenosis, unchanged from 2010 with excellent flow down the diagonal branch, mid circumflex 30%, proximal and mid RCA 20%, EF 60%.  Medical therapy continued.   . Hepatitis C    Treated [place]  . Hypertension   . Hypopotassemia   . Lichen planus   . Other and unspecified hyperlipidemia   . Other specified cardiac dysrhythmias(427.89)   . Paroxysmal supraventricular tachycardia ([place])   . Personal history of other infectious and parasitic disease   . Plantar fascial fibromatosis    . Syncope and collapse         Review of Systems  Constitutional: Negative for activity change, appetite change, chills and fever.  HENT: Negative for congestion, ear pain, hearing loss, postnasal drip, rhinorrhea, sinus pressure and sneezing.   Eyes: Negative for redness, itching and visual disturbance.  Respiratory: Positive for cough and wheezing. Negative for chest tightness and shortness of breath.   Cardiovascular: Negative for chest pain, palpitations and leg swelling.       Objective:   Physical Exam Vitals:   07/15/16 0956  BP: 134/76  Pulse: 85  SpO2: 95%  Weight: 225 lb 12.8 oz (102.4 kg)  Height: 5\' 11"  (1.803 m)   RA  Gen: well appearing HENT: OP clear,  neck supple PULM: CTA B, normal percussion CV: RRR, no mgr, trace edema GI: BS+, soft, nontender Derm: no cyanosis or rash Psyche: normal mood and affect   Imaging: March 2018 CT chest images independently reviewed: Normal pulmonary parenchyma in the upper lobes bilaterally, there is cylindrical bronchiectasis and mucous plugging in the bases, mild to moderate in severity. Pulmonary nodule left lower lobe stable compared to 2007. May 2018 CT chest images independently reviewed showing once again bronchiectatic changes with mucus plugging in the lower lobes bilaterally, some airspace consolidation in the left lower lobe consistent with pneumonia.  Hospital records from May 2018 reviewed were he was hospitalized for pneumonia. He was treated  with antibiotics for community-acquired pneumonia with Rocephin and azithromycin.  CBC    Component Value Date/Time   WBC 7.8 07/11/2016 0344   RBC 3.92 (L) 07/11/2016 0344   HGB 13.1 07/11/2016 0344   HCT 37.5 (L) 07/11/2016 0344   PLT 245 07/11/2016 0344   MCV 95.7 07/11/2016 0344   MCH 33.4 07/11/2016 0344   MCHC 34.9 07/11/2016 0344   RDW 12.7 07/11/2016 0344   LYMPHSABS 0.7 07/11/2016 0344   MONOABS 0.9 07/11/2016 0344   EOSABS 0.0 07/11/2016 0344    BASOSABS 0.0 07/11/2016 0344        Assessment & Plan:  Dysphagia I worry that this most recent episode of pneumonia was precipitated by his dental work with what I believe is dysphagia (despite the negative modified barium swallow test).  In the future with dental work we'll see if we can ask his dentist to have him sit up a bit more during the procedure.  Bronchiectasis ([place]) Recent flare with community-acquired pneumonia. He is improving with Augmentin. He was advised on the natural history of community acquired pneumonia in that he will have increasing cough and weakness over the next several weeks.  He has not been able to produce mucus which is a bit abnormal. He really needs to cough more mucus out in order to help get over this.  Plan: Prescribed nebulizer with hypertonic saline to use regularly for the next 3 days then as needed for chest congestion after that Prescribe albuterol to use in the incident of chest tightness or wheezing brought on by the hypertonic saline Call me if no improvement over the next few weeks or if worsening symptoms Chest x-ray in 6 weeks to ensure radiographic resolution of pneumonia Keep follow-up visit as previously scheduled    Current Outpatient Prescriptions:  .  amoxicillin-clavulanate (AUGMENTIN) 875-125 MG tablet, Take 1 tablet by mouth 2 (two) times daily., Disp: 20 tablet, Rfl: 0 .  aspirin 325 MG tablet, Take 325 mg by mouth daily., Disp: , Rfl:  .  atorvastatin (LIPITOR) 10 MG tablet, Take 10 mg by mouth daily., Disp: , Rfl:  .  B Complex Vitamins (VITAMIN B COMPLEX PO), Take 1 tablet by mouth daily., Disp: , Rfl:  .  buPROPion (WELLBUTRIN XL) 150 MG 24 hr tablet, Take 150 mg by mouth daily. , Disp: , Rfl:  .  Calcium Carbonate-Vitamin D (CALCIUM 600/VITAMIN D) 600-400 MG-UNIT per tablet, Take 1 tablet by mouth 2 (two) times daily.  , Disp: , Rfl:  .  hydrochlorothiazide (HYDRODIURIL) 25 MG tablet, Take 25 mg by mouth daily., Disp: ,  Rfl:  .  Melatonin 5 MG TABS, Take by mouth., Disp: , Rfl:  .  omeprazole (PRILOSEC) 20 MG capsule, Take 20 mg by mouth as needed. Patient uses this medication for acid reflux., Disp: , Rfl:  .  psyllium (METAMUCIL) 58.6 % powder, Take 1 packet by mouth daily. Patient uses this mediation for regularity., Disp: , Rfl:  .  Respiratory Therapy Supplies (FLUTTER) DEVI, Use as directed, Disp: 1 each, Rfl: 0 .  tadalafil (CIALIS) 20 MG tablet, Take 20 mg by mouth daily as needed. Patient uses this medication for ED., Disp: , Rfl:  .  tetrahydrozoline-zinc (VISINE-AC) 0.05-0.25 % ophthalmic solution, Place 2 drops into both eyes 3 (three) times daily as needed., Disp: , Rfl:  .  valsartan (DIOVAN) 160 MG tablet, Take 160 mg by mouth daily.  , Disp: , Rfl:  .  vitamin E 400 UNIT capsule,  Take 400 Units by mouth daily., Disp: , Rfl:  .  albuterol (PROVENTIL) (2.5 MG/3ML) 0.083% nebulizer solution, Take 3 mLs (2.5 mg total) by nebulization every 6 (six) hours as needed for wheezing or shortness of breath. DX: J47.0, Disp: 360 mL, Rfl: 11 .  sodium chloride HYPERTONIC 3 % nebulizer solution, Take by nebulization 2 (two) times daily., Disp: 750 mL, Rfl: 11

## 2016-07-15 NOTE — Assessment & Plan Note (Signed)
Recent flare with community-acquired pneumonia. He is improving with Augmentin. He was advised on the natural history of community acquired pneumonia in that he will have increasing cough and weakness over the next several weeks.  He has not been able to produce mucus which is a bit abnormal. He really needs to cough more mucus out in order to help get over this.  Plan: Prescribed nebulizer with hypertonic saline to use regularly for the next 3 days then as needed for chest congestion after that Prescribe albuterol to use in the incident of chest tightness or wheezing brought on by the hypertonic saline Call me if no improvement over the next few weeks or if worsening symptoms Chest x-ray in 6 weeks to ensure radiographic resolution of pneumonia Keep follow-up visit as previously scheduled

## 2016-07-15 NOTE — Assessment & Plan Note (Signed)
I worry that this most recent episode of pneumonia was precipitated by his dental work with what I believe is dysphagia (despite the negative modified barium swallow test).  In the future with dental work we'll see if we can ask his dentist to have him sit up a bit more during the procedure.

## 2016-07-15 NOTE — Patient Instructions (Signed)
Use hypertonic saline twice a day for the next 3 days then as needed for chest congestion after that Use albuterol as needed for chest tightness or wheezing Please let me know if you have increasing shortness of breath increasing chest congestion or fever We will plan on seeing you back on your regularly scheduled visit but we can see you sooner than that if needed

## 2016-07-16 DIAGNOSIS — J479 Bronchiectasis, uncomplicated: Secondary | ICD-10-CM | POA: Diagnosis not present

## 2016-07-16 DIAGNOSIS — J470 Bronchiectasis with acute lower respiratory infection: Secondary | ICD-10-CM | POA: Diagnosis not present

## 2016-07-24 ENCOUNTER — Telehealth: Payer: Self-pay | Admitting: Pulmonary Disease

## 2016-07-25 MED FILL — VALSARTAN 160 MG TABLET: 160 | 90 days supply | Qty: 90 | Fill #3

## 2016-07-25 MED FILL — HYDROCHLOROTHIAZIDE 25 MG T: 25 | 90 days supply | Qty: 90 | Fill #3

## 2016-07-25 MED FILL — AMLODIPINE BESYLATE 10 MG T: 10 | 90 days supply | Qty: 90 | Fill #2

## 2016-07-25 NOTE — Telephone Encounter (Signed)
Will send to [facility] to verify that these were received and to follow up.

## 2016-07-29 DIAGNOSIS — J479 Bronchiectasis, uncomplicated: Secondary | ICD-10-CM | POA: Diagnosis not present

## 2016-07-29 DIAGNOSIS — J189 Pneumonia, unspecified organism: Secondary | ICD-10-CM | POA: Diagnosis not present

## 2016-07-29 DIAGNOSIS — N529 Male erectile dysfunction, unspecified: Secondary | ICD-10-CM | POA: Diagnosis not present

## 2016-07-29 MED FILL — SILDENAFIL 50 MG TABLET: 50 | 30 days supply | Qty: 6 | Fill #0

## 2016-07-29 NOTE — Telephone Encounter (Signed)
Forms are still in BQ's cubby awaiting his signature and Caryl Pina is aware.

## 2016-07-30 NOTE — Telephone Encounter (Signed)
Forms have been given to BQ for completion.

## 2016-08-01 NOTE — Telephone Encounter (Signed)
Caryl Pina please give an update on the forms.  Have they been completed by BQ?  Thanks

## 2016-08-01 NOTE — Telephone Encounter (Signed)
BQ has not returned to office since giving these forms to him.  Will document when these forms are received back from BQ.

## 2016-08-05 NOTE — Telephone Encounter (Signed)
Rec'd FMLA paperwork - fwd to [employer] via interoffice mail 08/05/16 -pr

## 2016-08-05 NOTE — Telephone Encounter (Signed)
Forms filled out by BQ, and I've returned these forms to [place].

## 2016-08-07 DIAGNOSIS — H25811 Combined forms of age-related cataract, right eye: Secondary | ICD-10-CM | POA: Diagnosis not present

## 2016-08-07 DIAGNOSIS — H10413 Chronic giant papillary conjunctivitis, bilateral: Secondary | ICD-10-CM | POA: Diagnosis not present

## 2016-08-07 DIAGNOSIS — H25812 Combined forms of age-related cataract, left eye: Secondary | ICD-10-CM | POA: Diagnosis not present

## 2016-08-08 DIAGNOSIS — H2511 Age-related nuclear cataract, right eye: Secondary | ICD-10-CM | POA: Diagnosis not present

## 2016-08-08 MED FILL — OFLOXACIN 0.3% EYE DROPS: 0.3 | 25 days supply | Qty: 5 | Fill #0 | Status: TO

## 2016-08-08 MED FILL — KETOROLAC 0.4% OPHTH SOLN: 0.4 | 25 days supply | Qty: 5 | Fill #0 | Status: TO

## 2016-08-08 MED FILL — PREDNISOLONE AC 1% EYE DROP: 1 | 25 days supply | Qty: 5 | Fill #0 | Status: TO

## 2016-08-14 ENCOUNTER — Telehealth: Payer: Self-pay

## 2016-08-14 ENCOUNTER — Other Ambulatory Visit: Payer: Self-pay

## 2016-08-14 MED ORDER — HYDROCOD POLST-CPM POLST ER 10-8 MG/5ML PO SUER: 5.0000 mL | Freq: Two times a day (BID) | ORAL | 0 refills | Status: DC | PRN

## 2016-08-14 NOTE — Telephone Encounter (Signed)
-----   Message from Juanito Doom, MD sent at 08/11/2016  9:02 PM EDT ----- I need to send him an Rx for a cough medicine for his upcoming eye surgery;  Please remind me tomorrow. Thanks,

## 2016-08-14 NOTE — Telephone Encounter (Signed)
Per BQ- write tussionex 134ml with 0 refills, take 39ml bid prn for upcoming sx.    rx printed and left up front for pt.    lmtcb X1 for pt to make aware that rx has been left up front for pick up- per BQ, he had spoken with pt and is aware that we will be writing a cough syrup for him.

## 2016-08-14 NOTE — Telephone Encounter (Signed)
Pt returning call to [facility] and can be reached @ [phone].Tommy Santiago

## 2016-08-14 NOTE — Telephone Encounter (Signed)
Pt returned phone call, advised pt Rx was ready for pickup.ert

## 2016-08-14 NOTE — Telephone Encounter (Signed)
atc pt, line rang over 1 minute with no answer, no vm. Wcb. Pt just needs to be made aware that his cough syrup is ready for pick-up.

## 2016-08-15 MED FILL — HYDROCODONE-CHLORPHENIRAM S: 10-8 | 14 days supply | Qty: 140 | Fill #0

## 2016-08-20 DIAGNOSIS — H52221 Regular astigmatism, right eye: Secondary | ICD-10-CM | POA: Diagnosis not present

## 2016-08-20 DIAGNOSIS — H25811 Combined forms of age-related cataract, right eye: Secondary | ICD-10-CM | POA: Diagnosis not present

## 2016-08-20 DIAGNOSIS — H2511 Age-related nuclear cataract, right eye: Secondary | ICD-10-CM | POA: Diagnosis not present

## 2016-08-25 ENCOUNTER — Other Ambulatory Visit: Payer: Self-pay

## 2016-08-25 MED ORDER — DOXYCYCLINE HYCLATE 100 MG PO TABS: 100.0000 mg | ORAL_TABLET | Freq: Two times a day (BID) | ORAL | 0 refills | Status: DC

## 2016-08-25 MED FILL — DOXYCYCLINE HYCLATE 100 MG: 100 | 7 days supply | Qty: 14 | Fill #0

## 2016-08-28 MED FILL — KETOROLAC 0.4% OPHTH SOLN: 0.4 | 25 days supply | Qty: 5 | Fill #0

## 2016-08-28 MED FILL — OFLOXACIN 0.3% EYE DROPS: 0.3 | 25 days supply | Qty: 5 | Fill #0

## 2016-08-28 MED FILL — PREDNISOLONE AC 1% EYE DROP: 1 | 25 days supply | Qty: 5 | Fill #0

## 2016-10-02 MED FILL — ATORVASTATIN 10 MG TABLET: 10 | 90 days supply | Qty: 90 | Fill #2

## 2016-10-06 MED FILL — HYDROCHLOROTHIAZIDE 25 MG T: 25 | 90 days supply | Qty: 90 | Fill #0

## 2016-10-07 MED FILL — SODIUM CHLORIDE 3% VIAL: 3 | 90 days supply | Qty: 720 | Fill #0

## 2016-10-22 DIAGNOSIS — H2512 Age-related nuclear cataract, left eye: Secondary | ICD-10-CM | POA: Diagnosis not present

## 2016-10-22 DIAGNOSIS — H25812 Combined forms of age-related cataract, left eye: Secondary | ICD-10-CM | POA: Diagnosis not present

## 2016-10-28 DIAGNOSIS — I8500 Esophageal varices without bleeding: Secondary | ICD-10-CM | POA: Insufficient documentation

## 2016-10-28 DIAGNOSIS — K7469 Other cirrhosis of liver: Secondary | ICD-10-CM | POA: Insufficient documentation

## 2016-10-28 DIAGNOSIS — Z86010 Personal history of colonic polyps: Secondary | ICD-10-CM | POA: Insufficient documentation

## 2016-10-28 DIAGNOSIS — Z800 Family history of malignant neoplasm of digestive organs: Secondary | ICD-10-CM | POA: Insufficient documentation

## 2016-10-29 ENCOUNTER — Other Ambulatory Visit: Payer: Self-pay | Admitting: Gastroenterology

## 2016-10-29 DIAGNOSIS — Z125 Encounter for screening for malignant neoplasm of prostate: Secondary | ICD-10-CM | POA: Diagnosis not present

## 2016-10-29 DIAGNOSIS — R7989 Other specified abnormal findings of blood chemistry: Secondary | ICD-10-CM

## 2016-10-29 DIAGNOSIS — Z0000 Encounter for general adult medical examination without abnormal findings: Secondary | ICD-10-CM | POA: Diagnosis not present

## 2016-10-29 DIAGNOSIS — R319 Hematuria, unspecified: Secondary | ICD-10-CM | POA: Diagnosis not present

## 2016-10-29 DIAGNOSIS — C229 Malignant neoplasm of liver, not specified as primary or secondary: Secondary | ICD-10-CM | POA: Diagnosis not present

## 2016-10-30 MED FILL — AMLODIPINE BESYLATE 10 MG T: 10 | 90 days supply | Qty: 90 | Fill #3

## 2016-11-05 DIAGNOSIS — D225 Melanocytic nevi of trunk: Secondary | ICD-10-CM | POA: Diagnosis not present

## 2016-11-05 DIAGNOSIS — E785 Hyperlipidemia, unspecified: Secondary | ICD-10-CM | POA: Diagnosis not present

## 2016-11-05 DIAGNOSIS — R7303 Prediabetes: Secondary | ICD-10-CM | POA: Diagnosis not present

## 2016-11-05 DIAGNOSIS — L821 Other seborrheic keratosis: Secondary | ICD-10-CM | POA: Diagnosis not present

## 2016-11-05 DIAGNOSIS — L814 Other melanin hyperpigmentation: Secondary | ICD-10-CM | POA: Diagnosis not present

## 2016-11-05 DIAGNOSIS — Z0000 Encounter for general adult medical examination without abnormal findings: Secondary | ICD-10-CM | POA: Diagnosis not present

## 2016-11-05 DIAGNOSIS — Z23 Encounter for immunization: Secondary | ICD-10-CM | POA: Diagnosis not present

## 2016-12-30 ENCOUNTER — Other Ambulatory Visit: Payer: Medicare Other

## 2016-12-30 DIAGNOSIS — I8500 Esophageal varices without bleeding: Secondary | ICD-10-CM | POA: Diagnosis not present

## 2016-12-30 DIAGNOSIS — D123 Benign neoplasm of transverse colon: Secondary | ICD-10-CM | POA: Diagnosis not present

## 2016-12-30 DIAGNOSIS — Z800 Family history of malignant neoplasm of digestive organs: Secondary | ICD-10-CM | POA: Diagnosis not present

## 2016-12-30 DIAGNOSIS — Z1211 Encounter for screening for malignant neoplasm of colon: Secondary | ICD-10-CM | POA: Diagnosis not present

## 2016-12-30 DIAGNOSIS — D124 Benign neoplasm of descending colon: Secondary | ICD-10-CM | POA: Diagnosis not present

## 2016-12-30 DIAGNOSIS — K635 Polyp of colon: Secondary | ICD-10-CM | POA: Diagnosis not present

## 2016-12-30 DIAGNOSIS — K648 Other hemorrhoids: Secondary | ICD-10-CM | POA: Diagnosis not present

## 2016-12-30 DIAGNOSIS — Z86010 Personal history of colonic polyps: Secondary | ICD-10-CM | POA: Diagnosis not present

## 2016-12-31 ENCOUNTER — Ambulatory Visit
Admission: RE | Admit: 2016-12-31 | Discharge: 2016-12-31 | Disposition: A | Discharge status: Home / Self Care | Payer: 59 | Source: Ambulatory Visit | Attending: Gastroenterology | Admitting: Gastroenterology

## 2016-12-31 DIAGNOSIS — R7989 Other specified abnormal findings of blood chemistry: Secondary | ICD-10-CM

## 2017-01-05 MED FILL — ALBUTEROL 0.083% INHAL SOLN: (2.5 MG/3ML | 28 days supply | Qty: 300 | Fill #0

## 2017-01-05 MED FILL — SODIUM CHLORIDE 3% VIAL: 3 | 90 days supply | Qty: 720 | Fill #1

## 2017-01-05 MED FILL — ATORVASTATIN 10 MG TABLET: 10 | 90 days supply | Qty: 90 | Fill #3

## 2017-01-06 MED FILL — VALSARTAN 160 MG TABLET: 160 | 90 days supply | Qty: 90 | Fill #0

## 2017-01-12 ENCOUNTER — Ambulatory Visit
Admission: RE | Admit: 2017-01-12 | Discharge: 2017-01-12 | Disposition: A | Discharge status: Home / Self Care | Payer: 59 | Source: Ambulatory Visit | Attending: Gastroenterology | Admitting: Gastroenterology

## 2017-01-12 DIAGNOSIS — K7689 Other specified diseases of liver: Secondary | ICD-10-CM | POA: Diagnosis not present

## 2017-01-12 MED ORDER — GADOXETATE DISODIUM 0.25 MMOL/ML IV SOLN
10.0000 mL | Freq: Once | INTRAVENOUS | Status: AC | PRN
  Administered 2017-01-12: 10 mL via INTRAVENOUS

## 2017-01-21 ENCOUNTER — Ambulatory Visit: Payer: Self-pay | Admitting: General Surgery

## 2017-01-21 DIAGNOSIS — K429 Umbilical hernia without obstruction or gangrene: Secondary | ICD-10-CM | POA: Diagnosis not present

## 2017-01-26 ENCOUNTER — Other Ambulatory Visit: Payer: Self-pay | Admitting: Pulmonary Disease

## 2017-01-26 MED FILL — HYDROCHLOROTHIAZIDE 25 MG T: 25 | 90 days supply | Qty: 90 | Fill #1

## 2017-01-26 MED FILL — AMLODIPINE BESYLATE 10 MG T: 10 | 90 days supply | Qty: 90 | Fill #0

## 2017-01-27 NOTE — Progress Notes (Signed)
   01/27/17 1223  OBSTRUCTIVE SLEEP APNEA  Have you ever been diagnosed with sleep apnea through a sleep study? No  Do you snore loudly (loud enough to be heard through closed doors)?  1  Do you often feel tired, fatigued, or sleepy during the daytime (such as falling asleep during driving or talking to someone)? 0  Has anyone observed you stop breathing during your sleep? 0  Do you have, or are you being treated for high blood pressure? 1  BMI more than 35 kg/m2? 0  Age > 50 (1-yes) 1  Neck circumference greater than:Male 16 inches or larger, Male 17inches or larger? 0  Male Gender (Yes=1) 1  Obstructive Sleep Apnea Score 4

## 2017-01-28 ENCOUNTER — Encounter (HOSPITAL_BASED_OUTPATIENT_CLINIC_OR_DEPARTMENT_OTHER): Payer: Self-pay | Admitting: *Deleted

## 2017-01-28 ENCOUNTER — Encounter (HOSPITAL_BASED_OUTPATIENT_CLINIC_OR_DEPARTMENT_OTHER)
Admission: RE | Admit: 2017-01-28 | Discharge: 2017-01-28 | Disposition: A | Discharge status: Home / Self Care | Payer: 59 | Source: Ambulatory Visit | Attending: General Surgery | Admitting: General Surgery

## 2017-01-28 ENCOUNTER — Other Ambulatory Visit: Payer: Self-pay

## 2017-01-28 DIAGNOSIS — Z87891 Personal history of nicotine dependence: Secondary | ICD-10-CM | POA: Diagnosis not present

## 2017-01-28 DIAGNOSIS — Z7982 Long term (current) use of aspirin: Secondary | ICD-10-CM | POA: Diagnosis not present

## 2017-01-28 DIAGNOSIS — I10 Essential (primary) hypertension: Secondary | ICD-10-CM | POA: Diagnosis not present

## 2017-01-28 DIAGNOSIS — K429 Umbilical hernia without obstruction or gangrene: Secondary | ICD-10-CM | POA: Diagnosis not present

## 2017-01-28 DIAGNOSIS — Z79899 Other long term (current) drug therapy: Secondary | ICD-10-CM | POA: Diagnosis not present

## 2017-01-28 DIAGNOSIS — K219 Gastro-esophageal reflux disease without esophagitis: Secondary | ICD-10-CM | POA: Diagnosis not present

## 2017-01-28 DIAGNOSIS — I2510 Atherosclerotic heart disease of native coronary artery without angina pectoris: Secondary | ICD-10-CM | POA: Diagnosis not present

## 2017-01-28 DIAGNOSIS — Z955 Presence of coronary angioplasty implant and graft: Secondary | ICD-10-CM | POA: Diagnosis not present

## 2017-01-28 DIAGNOSIS — B1920 Unspecified viral hepatitis C without hepatic coma: Secondary | ICD-10-CM | POA: Diagnosis not present

## 2017-01-28 LAB — BASIC METABOLIC PANEL
ANION GAP: 10 (ref 5–15)
BUN: 14 mg/dL (ref 6–20)
CALCIUM: 9 mg/dL (ref 8.9–10.3)
CO2: 22 mmol/L (ref 22–32)
Chloride: 102 mmol/L (ref 101–111)
Creatinine, Ser: 0.94 mg/dL (ref 0.61–1.24)
GFR calc non Af Amer: >60 mL/min (ref >60)
GLUCOSE: 140 mg/dL — AB (ref 65–99)
POTASSIUM: 3.6 mmol/L (ref 3.5–5.1)
Sodium: 134 mmol/L — ABNORMAL LOW (ref 135–145)

## 2017-01-28 NOTE — Progress Notes (Signed)
Ensure pre surgery drink given with instructions to complete by 0515 dos, pt verbalized understanding. 

## 2017-01-29 NOTE — H&P (Signed)
Tommy Santiago 01/21/2017 9:15 AM Location: [facility] Patient #: [id number] DOB: Jul 06, 1945 Married / Language: Cleophus Molt / Race: White Male   History of Present Illness Lavone Neri E. Grandville Silos MD; 01/21/2017 9:29 AM) The patient is a 71 year old male who presents with an umbilical hernia. I was asked to see Breckon in consultation by Dr. Bernerd Limbo in regards to an umbilical hernia. He has had it for about a year. He is not having any severe discomfort in the area but is concerned it will gradually get larger. It pops in and out during the day. He goes down at night. He has had no change in bowel or bladder habits. Of note he is status post laparoscopic cholecystectomy in the past by Dr. Zella Richer.   Past Surgical History Malachy Moan, [place]; 01/21/2017 9:15 AM) Cataract Surgery  Bilateral. Gallbladder Surgery - Laparoscopic  Knee Surgery  Left.  Diagnostic Studies History Malachy Moan, [place]; 01/21/2017 9:15 AM) Colonoscopy  within last year  Allergies Malachy Moan, RMA; 01/21/2017 9:17 AM) AMLODIPINE  side effects : swollen ankles  Medication History Malachy Moan, RMA; 01/21/2017 9:21 AM) Albuterol Sulfate ((2.5 MG/3ML)0.083% Nebulized Soln, Inhalation) Active. Doxycycline Hyclate (100MG  Tablet, Oral) Active. Aspirin (325MG  Tablet, Oral) Active. B Complex Vitamins (Oral) Active. Calcium-Vitamin D (Oral) Specific strength unknown - Active. HydroCHLOROthiazide (25MG  Tablet, Oral) Active. Melatonin (5MG  Tablet, Oral) Active. Omeprazole (20MG  Capsule DR, Oral) Active. Metamucil (Oral) Specific strength unknown - Active. Cialis (20MG  Tablet, Oral) Active. Tetrahydrozoline-Zn Sulfate (0.05-0.25% Solution, Ophthalmic) Active. Valsartan (160MG  Tablet, Oral) Active. Vitamin E (400UNIT Capsule, Oral) Active. Medications Reconciled  Social History Malachy Moan, [place]; 01/21/2017 9:15 AM) Alcohol use  Moderate alcohol use. Caffeine  use  Coffee, Tea. Illicit drug use  Remotely quit drug use. Tobacco use  Former smoker.  Family History Malachy Moan, [place]; 01/21/2017 9:15 AM) Colon Cancer  Brother. Heart Disease  Brother, Father. Hypertension  Brother.  Other Problems Malachy Moan, RMA; 01/21/2017 9:15 AM) Anxiety Disorder  Depression  Gastroesophageal Reflux Disease  Hepatitis  High blood pressure  Other disease, cancer, significant illness  Umbilical Hernia Repair     Review of Systems Malachy Moan RMA; 01/21/2017 9:15 AM) General Not Present- Appetite Loss, Chills, Fatigue, Fever, Night Sweats, Weight Gain and Weight Loss. Skin Not Present- Change in Wart/Mole, Dryness, Hives, Jaundice, New Lesions, Non-Healing Wounds, Rash and Ulcer. HEENT Present- Visual Disturbances. Not Present- Earache, Hearing Loss, Hoarseness, Nose Bleed, Oral Ulcers, Ringing in the Ears, Seasonal Allergies, Sinus Pain, Sore Throat, Wears glasses/contact lenses and Yellow Eyes. Respiratory Present- Snoring. Not Present- Bloody sputum, Chronic Cough, Difficulty Breathing and Wheezing. Breast Not Present- Breast Mass, Breast Pain, Nipple Discharge and Skin Changes. Cardiovascular Not Present- Chest Pain, Difficulty Breathing Lying Down, Leg Cramps, Palpitations, Rapid Heart Rate, Shortness of Breath and Swelling of Extremities. Gastrointestinal Not Present- Abdominal Pain, Bloating, Bloody Stool, Change in Bowel Habits, Chronic diarrhea, Constipation, Difficulty Swallowing, Excessive gas, Gets full quickly at meals, Hemorrhoids, Indigestion, Nausea, Rectal Pain and Vomiting. Male Genitourinary Not Present- Blood in Urine, Change in Urinary Stream, Frequency, Impotence, Nocturia, Painful Urination, Urgency and Urine Leakage.  Vitals Malachy Moan RMA; 01/21/2017 9:21 AM) 01/21/2017 9:21 AM Weight: 230 lb Height: 71in Body Surface Area: 2.24 m Body Mass Index: 32.08 kg/m  Temp.: 97.60F  Pulse: 93  (Regular)  BP: 114/70 (Sitting, Left Arm, Standard)       Physical Exam Lavone Neri E. Grandville Silos MD; 01/21/2017 9:30 AM) General Mental Status-Alert. General Appearance-Consistent with stated age. Hydration-Well hydrated. Voice-Normal.  Integumentary Note: Multiple tattoos involving back and chest wall   Head and Neck Head-normocephalic, atraumatic with no lesions or palpable masses. Trachea-midline. Thyroid Gland Characteristics - normal size and consistency.  Eye Eyeball - Bilateral-Extraocular movements intact. Sclera/Conjunctiva - Bilateral-No scleral icterus.  Chest and Lung Exam Chest and lung exam reveals -quiet, even and easy respiratory effort with no use of accessory muscles and on auscultation, normal breath sounds, no adventitious sounds and normal vocal resonance. Inspection Chest Wall - Normal. Back - normal.  Cardiovascular Cardiovascular examination reveals -normal heart sounds, regular rate and rhythm with no murmurs and normal pedal pulses bilaterally.  Abdomen Inspection Hernias - Umbilical hernia - Reducible. Note: Small reducible primary umbilical hernia. Palpation/Percussion Palpation and Percussion of the abdomen reveal - Soft, Non Tender, No Rebound tenderness, No Rigidity (guarding) and No hepatosplenomegaly. Auscultation Auscultation of the abdomen reveals - Bowel sounds normal.  Neurologic Neurologic evaluation reveals -alert and oriented x 3 with no impairment of recent or remote memory. Mental Status-Normal.  Musculoskeletal Global Assessment -Note: no gross deformities.  Normal Exam - Left-Upper Extremity Strength Normal and Lower Extremity Strength Normal. Normal Exam - Right-Upper Extremity Strength Normal and Lower Extremity Strength Normal.  Lymphatic Head & Neck  General Head & Neck Lymphatics: Bilateral - Description - Normal. Axillary  General Axillary Region: Bilateral - Description -  Normal. Tenderness - Non Tender. Femoral & Inguinal  Generalized Femoral & Inguinal Lymphatics: Bilateral - Description - No Generalized lymphadenopathy.    Assessment & Plan Lavone Neri E. Grandville Silos MD; 46/27/0350 0:93 AM) UMBILICAL HERNIA WITHOUT OBSTRUCTION OR GANGRENE (K42.9) Impression: I have offered repair of this umbilical hernia with mesh. This will be an outpatient procedure. I discussed the procedure, risks, and benefits with him. He is agreeable. 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 I discussed the need for no heavy lifting for 6 weeks after surgery.

## 2017-01-30 ENCOUNTER — Encounter (HOSPITAL_BASED_OUTPATIENT_CLINIC_OR_DEPARTMENT_OTHER)
Admission: RE | Disposition: A | Discharge status: Home / Self Care | Payer: Self-pay | Source: Ambulatory Visit | Attending: General Surgery

## 2017-01-30 ENCOUNTER — Ambulatory Visit (HOSPITAL_BASED_OUTPATIENT_CLINIC_OR_DEPARTMENT_OTHER)
Admission: RE | Admit: 2017-01-30 | Discharge: 2017-01-30 | Disposition: A | Discharge status: Home / Self Care | Payer: 59 | Source: Ambulatory Visit | Attending: General Surgery | Admitting: General Surgery

## 2017-01-30 ENCOUNTER — Encounter (HOSPITAL_BASED_OUTPATIENT_CLINIC_OR_DEPARTMENT_OTHER): Payer: Self-pay

## 2017-01-30 ENCOUNTER — Other Ambulatory Visit: Payer: Self-pay

## 2017-01-30 ENCOUNTER — Ambulatory Visit (HOSPITAL_BASED_OUTPATIENT_CLINIC_OR_DEPARTMENT_OTHER): Payer: 59 | Admitting: Anesthesiology

## 2017-01-30 DIAGNOSIS — I10 Essential (primary) hypertension: Secondary | ICD-10-CM | POA: Insufficient documentation

## 2017-01-30 DIAGNOSIS — Z955 Presence of coronary angioplasty implant and graft: Secondary | ICD-10-CM | POA: Insufficient documentation

## 2017-01-30 DIAGNOSIS — I2510 Atherosclerotic heart disease of native coronary artery without angina pectoris: Secondary | ICD-10-CM | POA: Diagnosis not present

## 2017-01-30 DIAGNOSIS — K429 Umbilical hernia without obstruction or gangrene: Secondary | ICD-10-CM | POA: Insufficient documentation

## 2017-01-30 DIAGNOSIS — K219 Gastro-esophageal reflux disease without esophagitis: Secondary | ICD-10-CM | POA: Insufficient documentation

## 2017-01-30 DIAGNOSIS — Z79899 Other long term (current) drug therapy: Secondary | ICD-10-CM | POA: Diagnosis not present

## 2017-01-30 DIAGNOSIS — Z87891 Personal history of nicotine dependence: Secondary | ICD-10-CM | POA: Insufficient documentation

## 2017-01-30 DIAGNOSIS — Z7982 Long term (current) use of aspirin: Secondary | ICD-10-CM | POA: Diagnosis not present

## 2017-01-30 DIAGNOSIS — B1920 Unspecified viral hepatitis C without hepatic coma: Secondary | ICD-10-CM | POA: Insufficient documentation

## 2017-01-30 DIAGNOSIS — E782 Mixed hyperlipidemia: Secondary | ICD-10-CM | POA: Diagnosis not present

## 2017-01-30 HISTORY — DX: Pneumonia, unspecified organism: J18.9

## 2017-01-30 HISTORY — PX: UMBILICAL HERNIA REPAIR: SHX196

## 2017-01-30 HISTORY — PX: INSERTION OF MESH: SHX5868

## 2017-01-30 SURGERY — REPAIR, HERNIA, UMBILICAL, ADULT
Anesthesia: General | Site: Abdomen

## 2017-01-30 MED ORDER — CHLORHEXIDINE GLUCONATE CLOTH 2 % EX PADS: 6.0000 | MEDICATED_PAD | Freq: Once | CUTANEOUS | Status: DC

## 2017-01-30 MED ORDER — BUPIVACAINE-EPINEPHRINE 0.25% -1:200000 IJ SOLN
INTRAMUSCULAR | Status: DC | PRN
  Administered 2017-01-30: 20 mL

## 2017-01-30 MED ORDER — CEFAZOLIN SODIUM-DEXTROSE 2-4 GM/100ML-% IV SOLN
INTRAVENOUS | Status: AC
  Filled 2017-01-30: qty 100

## 2017-01-30 MED ORDER — PROMETHAZINE HCL 25 MG/ML IJ SOLN: 6.2500 mg | INTRAMUSCULAR | Status: DC | PRN

## 2017-01-30 MED ORDER — SCOPOLAMINE 1 MG/3DAYS TD PT72: 1.0000 | MEDICATED_PATCH | Freq: Once | TRANSDERMAL | Status: DC | PRN

## 2017-01-30 MED ORDER — LIDOCAINE HCL (CARDIAC) 20 MG/ML IV SOLN
INTRAVENOUS | Status: DC | PRN
  Administered 2017-01-30: 60 mg via INTRAVENOUS

## 2017-01-30 MED ORDER — GABAPENTIN 300 MG PO CAPS
ORAL_CAPSULE | ORAL | Status: AC
  Filled 2017-01-30: qty 1

## 2017-01-30 MED ORDER — MIDAZOLAM HCL 2 MG/2ML IJ SOLN: 1.0000 mg | INTRAMUSCULAR | Status: DC | PRN

## 2017-01-30 MED ORDER — DEXAMETHASONE SODIUM PHOSPHATE 4 MG/ML IJ SOLN
INTRAMUSCULAR | Status: DC | PRN
  Administered 2017-01-30: 10 mg via INTRAVENOUS

## 2017-01-30 MED ORDER — GABAPENTIN 300 MG PO CAPS
300.0000 mg | ORAL_CAPSULE | ORAL | Status: AC
  Administered 2017-01-30: 300 mg via ORAL

## 2017-01-30 MED ORDER — FENTANYL CITRATE (PF) 100 MCG/2ML IJ SOLN
INTRAMUSCULAR | Status: AC
  Filled 2017-01-30: qty 2

## 2017-01-30 MED ORDER — 0.9 % SODIUM CHLORIDE (POUR BTL) OPTIME
TOPICAL | Status: DC | PRN
  Administered 2017-01-30: 100 mL

## 2017-01-30 MED ORDER — ACETAMINOPHEN 500 MG PO TABS
1000.0000 mg | ORAL_TABLET | ORAL | Status: AC
  Administered 2017-01-30: 1000 mg via ORAL

## 2017-01-30 MED ORDER — CEFAZOLIN SODIUM-DEXTROSE 2-4 GM/100ML-% IV SOLN
2.0000 g | INTRAVENOUS | Status: AC
  Administered 2017-01-30: 2 g via INTRAVENOUS

## 2017-01-30 MED ORDER — FENTANYL CITRATE (PF) 100 MCG/2ML IJ SOLN: 25.0000 ug | INTRAMUSCULAR | Status: DC | PRN

## 2017-01-30 MED ORDER — PROPOFOL 10 MG/ML IV BOLUS
INTRAVENOUS | Status: DC | PRN
  Administered 2017-01-30: 200 mg via INTRAVENOUS

## 2017-01-30 MED ORDER — ONDANSETRON HCL 4 MG/2ML IJ SOLN
INTRAMUSCULAR | Status: DC | PRN
  Administered 2017-01-30: 4 mg via INTRAVENOUS

## 2017-01-30 MED ORDER — OXYCODONE HCL 5 MG PO TABS
ORAL_TABLET | ORAL | Status: AC
  Filled 2017-01-30: qty 1

## 2017-01-30 MED ORDER — OXYCODONE HCL 5 MG PO TABS
5.0000 mg | ORAL_TABLET | Freq: Once | ORAL | Status: AC
Start: 2017-01-30 — End: 2017-01-30
  Administered 2017-01-30: 5 mg via ORAL

## 2017-01-30 MED ORDER — FENTANYL CITRATE (PF) 100 MCG/2ML IJ SOLN
50.0000 ug | INTRAMUSCULAR | Status: DC | PRN
  Administered 2017-01-30: 100 ug via INTRAVENOUS

## 2017-01-30 MED ORDER — LACTATED RINGERS IV SOLN
INTRAVENOUS | Status: DC
  Administered 2017-01-30 (×2): via INTRAVENOUS

## 2017-01-30 MED ORDER — ACETAMINOPHEN 500 MG PO TABS
ORAL_TABLET | ORAL | Status: AC
  Filled 2017-01-30: qty 2

## 2017-01-30 MED ORDER — OXYCODONE HCL 5 MG PO TABS: 5.0000 mg | ORAL_TABLET | Freq: Four times a day (QID) | ORAL | 0 refills | Status: DC | PRN

## 2017-01-30 MED FILL — oxyCODONE HCL 5 MG TABS: 5 | 3 days supply | Qty: 25 | Fill #0

## 2017-01-30 SURGICAL SUPPLY — 47 items
ADH SKN CLS APL DERMABOND .7 (GAUZE/BANDAGES/DRESSINGS) ×1
APPLICATOR COTTON TIP 6IN STRL (MISCELLANEOUS) IMPLANT
BLADE CLIPPER SURG (BLADE) ×3 IMPLANT
BLADE HEX COATED 2.75 (ELECTRODE) ×3 IMPLANT
BLADE SURG 10 STRL SS (BLADE) IMPLANT
BLADE SURG 15 STRL LF DISP TIS (BLADE) ×1 IMPLANT
BLADE SURG 15 STRL SS (BLADE) ×3
CANISTER SUCT 1200ML W/VALVE (MISCELLANEOUS) ×3 IMPLANT
CHLORAPREP W/TINT 26ML (MISCELLANEOUS) ×3 IMPLANT
COVER BACK TABLE 60X90IN (DRAPES) ×3 IMPLANT
COVER MAYO STAND STRL (DRAPES) ×3 IMPLANT
DECANTER SPIKE VIAL GLASS SM (MISCELLANEOUS) IMPLANT
DERMABOND ADVANCED (GAUZE/BANDAGES/DRESSINGS) ×2
DERMABOND ADVANCED .7 DNX12 (GAUZE/BANDAGES/DRESSINGS) ×1 IMPLANT
DRAPE LAPAROTOMY 100X72 PEDS (DRAPES) ×3 IMPLANT
DRAPE UTILITY XL STRL (DRAPES) ×3 IMPLANT
ELECT REM PT RETURN 9FT ADLT (ELECTROSURGICAL) ×3
ELECTRODE REM PT RTRN 9FT ADLT (ELECTROSURGICAL) ×1 IMPLANT
GLOVE BIO SURGEON STRL SZ7 (GLOVE) ×2 IMPLANT
GLOVE BIO SURGEON STRL SZ8 (GLOVE) ×3 IMPLANT
GLOVE BIOGEL PI IND STRL 8 (GLOVE) ×1 IMPLANT
GLOVE BIOGEL PI INDICATOR 8 (GLOVE) ×2
GLOVE EXAM NITRILE MD LF STRL (GLOVE) ×2 IMPLANT
GOWN STRL REUS W/ TWL LRG LVL3 (GOWN DISPOSABLE) ×1 IMPLANT
GOWN STRL REUS W/ TWL XL LVL3 (GOWN DISPOSABLE) ×1 IMPLANT
GOWN STRL REUS W/TWL LRG LVL3 (GOWN DISPOSABLE) ×3
GOWN STRL REUS W/TWL XL LVL3 (GOWN DISPOSABLE) ×3
MESH VENTRALEX ST 1-7/10 CRC S (Mesh General) ×2 IMPLANT
NDL HYPO 25X1 1.5 SAFETY (NEEDLE) ×1 IMPLANT
NEEDLE HYPO 25X1 1.5 SAFETY (NEEDLE) ×3 IMPLANT
NS IRRIG 1000ML POUR BTL (IV SOLUTION) ×3 IMPLANT
PACK BASIN DAY SURGERY FS (CUSTOM PROCEDURE TRAY) ×3 IMPLANT
PENCIL BUTTON HOLSTER BLD 10FT (ELECTRODE) ×3 IMPLANT
SLEEVE SCD COMPRESS KNEE MED (MISCELLANEOUS) IMPLANT
SPONGE LAP 18X18 X RAY DECT (DISPOSABLE) ×3 IMPLANT
SUT MON AB 4-0 PC3 18 (SUTURE) ×3 IMPLANT
SUT PROLENE 0 CT 2 (SUTURE) ×6 IMPLANT
SUT VIC AB 2-0 SH 27 (SUTURE) ×3
SUT VIC AB 2-0 SH 27XBRD (SUTURE) ×1 IMPLANT
SUT VIC AB 3-0 SH 27 (SUTURE) ×6
SUT VIC AB 3-0 SH 27X BRD (SUTURE) ×1 IMPLANT
SYR CONTROL 10ML LL (SYRINGE) ×3 IMPLANT
TOWEL OR 17X24 6PK STRL BLUE (TOWEL DISPOSABLE) ×3 IMPLANT
TOWEL OR NON WOVEN STRL DISP B (DISPOSABLE) ×3 IMPLANT
TUBE CONNECTING 20'X1/4 (TUBING) ×1
TUBE CONNECTING 20X1/4 (TUBING) ×2 IMPLANT
YANKAUER SUCT BULB TIP NO VENT (SUCTIONS) ×3 IMPLANT

## 2017-01-30 NOTE — Discharge Instructions (Signed)

## 2017-01-30 NOTE — Transfer of Care (Signed)
Immediate Anesthesia Transfer of Care Note  Patient: Tommy Santiago  Procedure(s) Performed: REPAIR OF UMBILICAL HERNIA (N/A Abdomen) INSERTION OF MESH (N/A Abdomen)  Patient Location: PACU  Anesthesia Type:General  Level of Consciousness: awake and patient cooperative  Airway & Oxygen Therapy: Patient Spontanous Breathing and Patient connected to face mask oxygen  Post-op Assessment: Report given to RN and Post -op Vital signs reviewed and stable  Post vital signs: Reviewed and stable  Last Vitals:  Vitals:   01/30/17 0722 01/30/17 0915  BP: 115/80 101/63  Pulse: 84 70  Resp: 18 17  Temp: 36.6 C 36.6 C  SpO2: 95% 99%    Last Pain:  Vitals:   01/30/17 0722  TempSrc: Oral         Complications: No apparent anesthesia complications

## 2017-01-30 NOTE — Interval H&P Note (Signed)
History and Physical Interval Note:  01/30/2017 8:14 AM  Tommy Santiago  has presented today for surgery, with the diagnosis of UMBILICAL HERNIA  The various methods of treatment have been discussed with the patient and family. After consideration of risks, benefits and other options for treatment, the patient has consented to  Procedure(s): REPAIR OF UMBILICAL HERNIA WITH MESH (N/A) INSERTION OF MESH (N/A) as a surgical intervention .  The patient's history has been reviewed, patient examined, no change in status, stable for surgery.  I have reviewed the patient's chart and labs.  Questions were answered to the patient's satisfaction.     Zenovia Jarred

## 2017-01-30 NOTE — Anesthesia Preprocedure Evaluation (Addendum)
Anesthesia Evaluation  Patient identified by MRN, date of birth, ID band Patient awake    Reviewed: Allergy & Precautions, NPO status , Patient's Chart, lab work & pertinent test results  Airway Mallampati: II  TM Distance: >3 FB Neck ROM: Full    Dental  (+) Dental Advisory Given   Pulmonary former smoker,    breath sounds clear to auscultation       Cardiovascular hypertension, Pt. on medications + CAD and + Cardiac Stents   Rhythm:Regular Rate:Normal     Neuro/Psych negative neurological ROS     GI/Hepatic negative GI ROS, (+) Hepatitis -, C  Endo/Other  negative endocrine ROS  Renal/GU negative Renal ROS     Musculoskeletal   Abdominal   Peds  Hematology negative hematology ROS (+)   Anesthesia Other Findings   Reproductive/Obstetrics                            Anesthesia Physical Anesthesia Plan  ASA: III  Anesthesia Plan: General   Post-op Pain Management:    Induction: Intravenous  PONV Risk Score and Plan: 2 and Ondansetron, Dexamethasone and Treatment may vary due to age or medical condition  Airway Management Planned: LMA  Additional Equipment:   Intra-op Plan:   Post-operative Plan: Extubation in OR  Informed Consent: I have reviewed the patients History and Physical, chart, labs and discussed the procedure including the risks, benefits and alternatives for the proposed anesthesia with the patient or authorized representative who has indicated his/her understanding and acceptance.   Dental advisory given  Plan Discussed with: CRNA  Anesthesia Plan Comments:         Anesthesia Quick Evaluation

## 2017-01-30 NOTE — Anesthesia Procedure Notes (Signed)
Procedure Name: LMA Insertion Date/Time: 01/30/2017 8:34 AM Performed by: Signe Colt, CRNA Pre-anesthesia Checklist: Patient identified, Emergency Drugs available, Suction available and Patient being monitored Patient Re-evaluated:Patient Re-evaluated prior to induction Oxygen Delivery Method: Circle system utilized Preoxygenation: Pre-oxygenation with 100% oxygen Induction Type: IV induction Ventilation: Mask ventilation without difficulty LMA: LMA inserted LMA Size: 4.0 Number of attempts: 1 Airway Equipment and Method: Bite block Placement Confirmation: positive ETCO2 Tube secured with: Tape Dental Injury: Teeth and Oropharynx as per pre-operative assessment

## 2017-01-30 NOTE — Op Note (Signed)
01/30/2017  9:12 AM  PATIENT:  Tommy Santiago  71 y.o. male  PRE-OPERATIVE DIAGNOSIS:  UMBILICAL HERNIA  POST-OPERATIVE DIAGNOSIS:  UMBILICAL HERNIA  PROCEDURE:  Procedure(s): REPAIR OF UMBILICAL HERNIA INSERTION OF MESH  SURGEON:  Surgeon(s): Georganna Skeans, MD  ASSISTANTS: none   ANESTHESIA:   local and general  EBL:  Total I/O In: 1000 [I.V.:1000] Out: -   BLOOD ADMINISTERED:none  DRAINS: none   SPECIMEN:  No Specimen  DISPOSITION OF SPECIMEN:  N/A  COUNTS:  YES  DICTATION: .Dragon Dictation Detail: Amato presents for umbilical hernia repair with mesh.  He was identified in the preop holding area.  Informed consent was obtained.  He received intravenous antibiotics.  He was brought to the operating room and general anesthesia with laryngeal mask airway was administered by the anesthesia staff.  His abdomen was prepped and draped in a sterile fashion.  We did a timeout procedure.  Local anesthetic was injected.  Infraumbilical incision was made.  Subtenons tissues were dissected down revealing the sac.  The umbilicus was circumferentially dissected and the umbilical skin was dissected off of the sac.  The sac was excised.  It only contained omentum.  The omentum was reduced back into the abdomen.  The remainder of the sac was debrided.  The hernia was repaired with a 4 cm circular mesh.  This was secured to the fascia in an inlay fashion with 0 Prolene. The wound was irrigated.  Additional local was injected.  The umbilical skin was tacked back down with 2-0 Vicryl.  The skin was closed with running 4-0 Monocryl after ensuring good hemostasis.  All counts were correct.  He tolerated the procedure well without apparent complication and was taken recovery in stable condition. PATIENT DISPOSITION:  PACU - hemodynamically stable.   Delay start of Pharmacological VTE agent (>24hrs) due to surgical blood loss or risk of bleeding:  no  Georganna Skeans, MD, MPH, FACS Pager:  [phone]  12/7/20189:12 AM

## 2017-01-30 NOTE — Anesthesia Postprocedure Evaluation (Signed)
Anesthesia Post Note  Patient: Tommy Santiago  Procedure(s) Performed: REPAIR OF UMBILICAL HERNIA (N/A Abdomen) INSERTION OF MESH (N/A Abdomen)     Patient location during evaluation: PACU Anesthesia Type: General Level of consciousness: awake and alert Pain management: pain level controlled Vital Signs Assessment: post-procedure vital signs reviewed and stable Respiratory status: spontaneous breathing, nonlabored ventilation, respiratory function stable and patient connected to nasal cannula oxygen Cardiovascular status: blood pressure returned to baseline and stable Postop Assessment: no apparent nausea or vomiting Anesthetic complications: no    Last Vitals:  Vitals:   01/30/17 0945 01/30/17 1002  BP: 115/87 126/72  Pulse: 68 66  Resp: 14 16  Temp:  (!) 36.3 C  SpO2: 93% 95%    Last Pain:  Vitals:   01/30/17 1002  TempSrc: Oral  PainSc: 0-No pain                 Tiajuana Amass

## 2017-02-02 ENCOUNTER — Encounter (HOSPITAL_BASED_OUTPATIENT_CLINIC_OR_DEPARTMENT_OTHER): Payer: Self-pay | Admitting: General Surgery

## 2017-02-10 ENCOUNTER — Telehealth: Payer: Self-pay | Admitting: Pulmonary Disease

## 2017-02-10 NOTE — Telephone Encounter (Signed)
Patient returning call - he can be reached at [phone]

## 2017-02-10 NOTE — Telephone Encounter (Signed)
ATC pt, no answer. Left message for pt to call back.  I would like to discuss why pt needs Doxycycline.

## 2017-02-10 NOTE — Telephone Encounter (Signed)
Spoke with pt, he states he wanted pt to have a Rx handy in case he needed it. He states he is not having symptoms at this time but he has bronchiectasis and needs the Rx on hand if BQ agrees. He explains it is used for preventative measures. BQ please advise.

## 2017-02-11 MED ORDER — DOXYCYCLINE HYCLATE 100 MG PO TABS: 100.0000 mg | ORAL_TABLET | Freq: Two times a day (BID) | ORAL | 2 refills | Status: DC

## 2017-02-11 MED FILL — DOXYCYCLINE HYCLATE 100 MG: 100 | 7 days supply | Qty: 14 | Fill #0

## 2017-02-11 NOTE — Telephone Encounter (Signed)
Doxycycline 100mg  po bid x 7 days, refill 2

## 2017-02-11 NOTE — Telephone Encounter (Signed)
Pt aware of recs.  rxs sent to pharmacy.  Nothing further needed.  

## 2017-04-20 DIAGNOSIS — L570 Actinic keratosis: Secondary | ICD-10-CM | POA: Diagnosis not present

## 2017-04-20 DIAGNOSIS — L821 Other seborrheic keratosis: Secondary | ICD-10-CM | POA: Diagnosis not present

## 2017-04-20 DIAGNOSIS — B356 Tinea cruris: Secondary | ICD-10-CM | POA: Diagnosis not present

## 2017-04-20 MED FILL — GRISEOFULVIN MICRO 500 MG T: 500 | 30 days supply | Qty: 30 | Fill #0

## 2017-04-20 MED FILL — ECONAZOLE NITRATE 1% CREAM: 1 | 30 days supply | Qty: 85 | Fill #0

## 2017-04-20 MED FILL — VALSARTAN 160 MG TABLET: 160 | 90 days supply | Qty: 90 | Fill #1

## 2017-04-22 DIAGNOSIS — R7303 Prediabetes: Secondary | ICD-10-CM | POA: Diagnosis not present

## 2017-04-22 DIAGNOSIS — E785 Hyperlipidemia, unspecified: Secondary | ICD-10-CM | POA: Diagnosis not present

## 2017-04-22 DIAGNOSIS — I10 Essential (primary) hypertension: Secondary | ICD-10-CM | POA: Diagnosis not present

## 2017-04-22 DIAGNOSIS — M8580 Other specified disorders of bone density and structure, unspecified site: Secondary | ICD-10-CM | POA: Diagnosis not present

## 2017-04-22 DIAGNOSIS — R0683 Snoring: Secondary | ICD-10-CM | POA: Diagnosis not present

## 2017-04-22 DIAGNOSIS — I2510 Atherosclerotic heart disease of native coronary artery without angina pectoris: Secondary | ICD-10-CM | POA: Diagnosis not present

## 2017-04-22 MED FILL — ATORVASTATIN 10 MG TABLET: 10 | 90 days supply | Qty: 90 | Fill #0

## 2017-04-22 MED FILL — NITROGLYCERIN 0.4 MG TAB SL: 0.4 | 13 days supply | Qty: 25 | Fill #0

## 2017-04-23 MED FILL — AMLODIPINE BESYLATE 10 MG T: 10 | 90 days supply | Qty: 90 | Fill #1

## 2017-04-30 ENCOUNTER — Other Ambulatory Visit: Payer: Self-pay | Admitting: Family Medicine

## 2017-04-30 DIAGNOSIS — M8580 Other specified disorders of bone density and structure, unspecified site: Secondary | ICD-10-CM

## 2017-05-12 ENCOUNTER — Ambulatory Visit: Payer: PPO | Admitting: Pulmonary Disease

## 2017-05-12 VITALS — BP 132/68 | HR 85 | Ht 71.0 in | Wt 233.0 lb

## 2017-05-12 DIAGNOSIS — J479 Bronchiectasis, uncomplicated: Secondary | ICD-10-CM

## 2017-05-12 NOTE — Patient Instructions (Signed)
Bronchiectasis: Continue using Protonix saline several times a week as needed for chest congestion Use albuterol as needed for chest tightness wheezing or shortness of breath or chest congestion as you are doing Let us know if you need a refill on the prescription for your antibiotic Stay active Practice good hand hygiene this time a year as there is still fluid activity  We will see you back in 1 year or sooner if needed

## 2017-05-12 NOTE — Progress Notes (Signed)
Subjective:    Patient ID: Tommy Santiago, male    DOB: Aug 23, 1945, 72 y.o.   MRN: [id number]  Synopsis: First evaluated in 2018 for recurrent pneumonia and bronchitis symptoms. Noted to have bronchiectasis primarily in the right lower lobe. This is felt to be related to either occult chronic aspiration (modified barium swallow negative) or perhaps the result of a prior episode of pneumonia.  HPI Chief Complaint  Patient presents with  . Follow-up    pt c/o stable prod cough, unsure of mucus color.     Tommy Santiago retired back in December and has been doing well since then.  He plans to start traveling April.  He has been using his hypertonic saline 2-3 times a week as well as albuterol on an as-needed basis.  In general he has been doing well.  No recent episodes of bronchitis or pneumonia.  He is staying active with going to the gym and is been working out in the yard.  Past Medical History:  Diagnosis Date  . Bronchiectasis ([place])   . Coronary atherosclerosis of unspecified type of vessel, native or graft    status post cardiac cath w percutaneous coronary intervention using XIENCE drug-eluting stent to mid left anterior descending 07/23/08; LHC 08/07/11: Mid LAD stent patent, moderate size diagonal branch jailed by stent with 70% ostial stenosis, unchanged from 2010 with excellent flow down the diagonal branch, mid circumflex 30%, proximal and mid RCA 20%, EF 60%.  Medical therapy continued.   . Hepatitis C    Treated [facility]  . Hypertension   . Hypopotassemia   . Lichen planus   . Other and unspecified hyperlipidemia   . Other specified cardiac dysrhythmias(427.89)   . Paroxysmal supraventricular tachycardia ([place])   . Personal history of other infectious and parasitic disease   . Plantar fascial fibromatosis   . Pneumonia   . Syncope and collapse         Review of Systems  Constitutional: Negative for activity change, appetite change, chills and fever.  HENT: Negative for  congestion, ear pain, hearing loss, postnasal drip, rhinorrhea, sinus pressure and sneezing.   Eyes: Negative for redness, itching and visual disturbance.  Respiratory: Positive for cough and wheezing. Negative for chest tightness and shortness of breath.   Cardiovascular: Negative for chest pain, palpitations and leg swelling.       Objective:   Physical Exam Vitals:   05/12/17 1538  BP: 132/68  Pulse: 85  SpO2: 96%  Weight: 233 lb (105.7 kg)  Height: 5\' 11"  (1.803 m)   RA  *Gen: well appearing HENT: OP clear, TM's clear, neck supple PULM: Wheeze RLL, normal percussion CV: RRR, no mgr, trace edema GI: BS+, soft, nontender Derm: no cyanosis or rash Psyche: normal mood and affect    Imaging: March 2018 CT chest images independently reviewed: Normal pulmonary parenchyma in the upper lobes bilaterally, there is cylindrical bronchiectasis and mucous plugging in the bases, mild to moderate in severity. Pulmonary nodule left lower lobe stable compared to 2007. May 2018 CT chest images independently reviewed showing once again bronchiectatic changes with mucus plugging in the lower lobes bilaterally, some airspace consolidation in the left lower lobe consistent with pneumonia.  Hospital records from May 2018 reviewed were he was hospitalized for pneumonia. He was treated with antibiotics for community-acquired pneumonia with Rocephin and azithromycin.  CBC    Component Value Date/Time   WBC 7.8 07/11/2016 0344   RBC 3.92 (L) 07/11/2016 0344   HGB 13.1  07/11/2016 0344   HCT 37.5 (L) 07/11/2016 0344   PLT 245 07/11/2016 0344   MCV 95.7 07/11/2016 0344   MCH 33.4 07/11/2016 0344   MCHC 34.9 07/11/2016 0344   RDW 12.7 07/11/2016 0344   LYMPHSABS 0.7 07/11/2016 0344   MONOABS 0.9 07/11/2016 0344   EOSABS 0.0 07/11/2016 0344   BASOSABS 0.0 07/11/2016 0344        Assessment & Plan:   No diagnosis found.  Discussion: This has been a stable interval for Tommy Santiago.  He has not  had an exacerbation since the last visit.  He is able to control his bronchiectasis well with mucociliary clearance measures with hypertonic saline several times per week as well as albuterol.  He has not had an exacerbation recently.  Plan: Bronchiectasis: Continue using Protonix saline several times a week as needed for chest congestion Use albuterol as needed for chest tightness wheezing or shortness of breath or chest congestion as you are doing Let us know if you need a refill on the prescription for your antibiotic Stay active Practice good hand hygiene this time a year as there is still fluid activity  We will see you back in 1 year or sooner if needed   Current Outpatient Medications:  .  albuterol (PROVENTIL) (2.5 MG/3ML) 0.083% nebulizer solution, Take 3 mLs (2.5 mg total) by nebulization every 6 (six) hours as needed for wheezing or shortness of breath. DX: J47.0, Disp: 360 mL, Rfl: 11 .  aspirin 325 MG tablet, Take 325 mg by mouth daily., Disp: , Rfl:  .  Calcium Carbonate-Vitamin D (CALCIUM 600/VITAMIN D) 600-400 MG-UNIT per tablet, Take 1 tablet by mouth 2 (two) times daily.  , Disp: , Rfl:  .  hydrochlorothiazide (HYDRODIURIL) 25 MG tablet, Take 25 mg by mouth daily., Disp: , Rfl:  .  Melatonin 5 MG TABS, Take by mouth., Disp: , Rfl:  .  omeprazole (PRILOSEC) 20 MG capsule, Take 20 mg by mouth as needed. Patient uses this medication for acid reflux., Disp: , Rfl:  .  oxyCODONE (OXY IR/ROXICODONE) 5 MG immediate release tablet, Take 1-2 tablets (5-10 mg total) by mouth every 6 (six) hours as needed for severe pain., Disp: 25 tablet, Rfl: 0 .  psyllium (METAMUCIL) 58.6 % powder, Take 1 packet by mouth daily. Patient uses this mediation for regularity., Disp: , Rfl:  .  Respiratory Therapy Supplies (FLUTTER) DEVI, Use as directed, Disp: 1 each, Rfl: 0 .  sodium chloride HYPERTONIC 3 % nebulizer solution, Take by nebulization 2 (two) times daily., Disp: 750 mL, Rfl: 11 .  tadalafil  (CIALIS) 20 MG tablet, Take 20 mg by mouth daily as needed. Patient uses this medication for ED., Disp: , Rfl:  .  valsartan (DIOVAN) 160 MG tablet, Take 160 mg by mouth daily.  , Disp: , Rfl:  .  atorvastatin (LIPITOR) 10 MG tablet, Take 10 mg by mouth daily., Disp: , Rfl:  .  doxycycline (VIBRA-TABS) 100 MG tablet, Take 1 tablet (100 mg total) by mouth 2 (two) times daily. (Patient not taking: Reported on 05/12/2017), Disp: 14 tablet, Rfl: 2

## 2017-05-13 ENCOUNTER — Ambulatory Visit
Admission: RE | Admit: 2017-05-13 | Discharge: 2017-05-13 | Disposition: A | Discharge status: Home / Self Care | Payer: 59 | Source: Ambulatory Visit | Attending: Family Medicine | Admitting: Family Medicine

## 2017-05-13 ENCOUNTER — Encounter: Payer: Self-pay | Admitting: Pulmonary Disease

## 2017-05-13 DIAGNOSIS — M8580 Other specified disorders of bone density and structure, unspecified site: Secondary | ICD-10-CM

## 2017-05-13 DIAGNOSIS — M85852 Other specified disorders of bone density and structure, left thigh: Secondary | ICD-10-CM | POA: Diagnosis not present

## 2017-05-21 DIAGNOSIS — Z961 Presence of intraocular lens: Secondary | ICD-10-CM | POA: Diagnosis not present

## 2017-05-21 DIAGNOSIS — H01021 Squamous blepharitis right upper eyelid: Secondary | ICD-10-CM | POA: Diagnosis not present

## 2017-05-21 DIAGNOSIS — H01024 Squamous blepharitis left upper eyelid: Secondary | ICD-10-CM | POA: Diagnosis not present

## 2017-05-21 DIAGNOSIS — H01025 Squamous blepharitis left lower eyelid: Secondary | ICD-10-CM | POA: Diagnosis not present

## 2017-05-21 DIAGNOSIS — H01022 Squamous blepharitis right lower eyelid: Secondary | ICD-10-CM | POA: Diagnosis not present

## 2017-06-03 DIAGNOSIS — B356 Tinea cruris: Secondary | ICD-10-CM | POA: Diagnosis not present

## 2017-06-08 MED FILL — HYDROCHLOROTHIAZIDE 25 MG T: 25 | 90 days supply | Qty: 90 | Fill #2

## 2017-06-11 ENCOUNTER — Encounter: Payer: Self-pay | Admitting: Neurology

## 2017-06-16 ENCOUNTER — Institutional Professional Consult (permissible substitution): Payer: 59 | Admitting: Neurology

## 2017-06-22 ENCOUNTER — Ambulatory Visit (INDEPENDENT_AMBULATORY_CARE_PROVIDER_SITE_OTHER): Payer: PPO | Admitting: Neurology

## 2017-06-22 ENCOUNTER — Encounter: Payer: Self-pay | Admitting: Neurology

## 2017-06-22 VITALS — BP 130/83 | HR 90 | Ht 71.0 in | Wt 232.0 lb

## 2017-06-22 DIAGNOSIS — R0683 Snoring: Secondary | ICD-10-CM

## 2017-06-22 DIAGNOSIS — R0681 Apnea, not elsewhere classified: Secondary | ICD-10-CM | POA: Diagnosis not present

## 2017-06-22 DIAGNOSIS — I2510 Atherosclerotic heart disease of native coronary artery without angina pectoris: Secondary | ICD-10-CM | POA: Diagnosis not present

## 2017-06-22 DIAGNOSIS — G4726 Circadian rhythm sleep disorder, shift work type: Secondary | ICD-10-CM | POA: Insufficient documentation

## 2017-06-22 DIAGNOSIS — R351 Nocturia: Secondary | ICD-10-CM

## 2017-06-22 MED FILL — ECONAZOLE NITRATE 1% CREAM: 1 | 30 days supply | Qty: 85 | Fill #1

## 2017-06-22 NOTE — Progress Notes (Signed)
SLEEP MEDICINE CLINIC   Provider:  Larey Seat, M D  Primary Care Physician:  Bernerd Limbo, MD   Referring Provider: Bernerd Limbo, MD    Chief Complaint  Patient presents with  . New Patient (Initial Visit)    pt alone, rm 10. pt worked from 24 until retirement working night shift. wake up feeling tired. can lay down anytime and doze off. pt states that he snores. sleep study was completed 10 years ago and didnt show anything at that time.     HPI:  Tommy Santiago is a 72 y.o. male , seen here as in a referralfrom Dr. Coletta Memos for an evaluation for possible sleep apnea.  Mr. 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  He has been a Medical illustrator.  He carries a diagnosis of bronchiectasis without complications but at one time had community-acquired pneumonia, coronary atherosclerosis- see below =, close compression fracture of the second lumbar vertebra, GERD, hepatitis C history, history of cataract surgery hypertension hyperlipidemia, lichen planus, nocturia and overweight.  He has never reached morbid obesity.  He was diagnosed as having osteopenia.  He used to smoke, and has at one time and entry in his medical chart for paroxysmal supraventricular tachycardia.  I also reviewed his current list of medications.  There is none of these said I would associate a severe impact on sleep quality of sleep architecture, albuterol as a as needed used nebulizer and could sometimes increase the tendency of tremor or insomnia.  Mr. [name] has previous sleep study took place on 28 December 2006 after referral by Dr. Philipp Deputy.  The patient was at that time complaining of hypersomnia with snoring possible sleep apnea but had endorsed the Epworth score is only 4 out of 24 points.  His AHI was 2.5/h, REM AHI was 2.2, insufficient to justify any kind of intervention mean oxygen saturation was 94.8% on room air,  isolated limb jerks were noticed,  Sleep habits:  Patient works for over a decades week and nights and had resumed to involve a normal sleep schedule for the weekdays, using melatonin to help him reestablish his sleep cycle.   He usually watches laid-back news on TV before he initiates his bedtime now in retirement.  And his bedtime is around midnight, and he rarely has difficulties initiating sleep.  Once he is asleep he will stay usually asleep for about 2 to 3 hours, he is then first interrupted by the needs to go to the bathroom.  He may reinitiate sleep for another 2 hours -total bathroom breaks at night are 2-3.  He no longer describes racing thoughts, attributes the resolution of these obcessive thoughts with CBD oil. Anxiety is improved. He rises between 8 and 9 AM. Gets 7 around hours of sleep. Wife reports him snoring- the bedroom is cool, quiet and dark , sleep mask and earplugs. .   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 List of diagnosis :Above.   Social history: The patient is a former smoker, drinks usually 2 beers a day, 6 he drinks up to 6 cups of coffee a day.  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 married with grown up children( 54 and 73 years old)   Review of Systems: Out of a complete 14 system review, the patient complains of only the following symptoms, and all other reviewed systems  are negative.   Anxiety improved, nocturia, arthritis, boredom.   Epworth score 9 , Fatigue severity score 39  , depression score 4/ 15    Social History   Socioeconomic History  . Marital status: Married    Spouse name: Not on file  . Number of children: Not on file  . Years of education: Not on file  . Highest education level: Not on file  Occupational History  . Occupation: Critical care RN  Social Needs  . Financial resource strain: Not on file  . Food insecurity:    Worry: Not on file    Inability:  Not on file  . Transportation needs:    Medical: Not on file    Non-medical: Not on file  Tobacco Use  . Smoking status: Former Smoker    Packs/day: 2.00    Years: 25.00    Pack years: 50.00    Last attempt to quit: 1989    Years since quitting: 30.3  . Smokeless tobacco: Never Used  Substance and Sexual Activity  . Alcohol use: Yes    Comment: social  . Drug use: No    Comment: quit 25 years ago  . Sexual activity: Not on file  Lifestyle  . Physical activity:    Days per week: Not on file    Minutes per session: Not on file  . Stress: Not on file  Relationships  . Social connections:    Talks on phone: Not on file    Gets together: Not on file    Attends religious service: Not on file    Active member of club or organization: Not on file    Attends meetings of clubs or organizations: Not on file    Relationship status: Not on file  . Intimate partner violence:    Fear of current or ex partner: Not on file    Emotionally abused: Not on file    Physically abused: Not on file    Forced sexual activity: Not on file  Other Topics Concern  . Not on file  Social History Narrative  . Not on file    Family History  Problem Relation Age of Onset  . Heart disease Father   . Coronary artery disease Brother        diagnosed in early 72s  . Rectal cancer Brother   . Coronary artery disease Other        hx family    Past Medical History:  Diagnosis Date  . Bronchiectasis ([place])   . CAP (community acquired pneumonia)   . Coronary atherosclerosis of unspecified type of vessel, native or graft    status post cardiac cath w percutaneous coronary intervention using XIENCE drug-eluting stent to mid left anterior descending 07/23/08; LHC 08/07/11: Mid LAD stent patent, moderate size diagonal branch jailed by stent with 70% ostial stenosis, unchanged from 2010 with excellent flow down the diagonal branch, mid circumflex 30%, proximal and mid RCA 20%, EF 60%.  Medical therapy  continued.   Marland Kitchen GERD (gastroesophageal reflux disease)   . Hepatitis C    Treated [facility]  . Hyperlipemia   . Hypertension   . Hypopotassemia   . Lichen planus   . Other and unspecified hyperlipidemia   . Other specified cardiac dysrhythmias(427.89)   . Paroxysmal supraventricular tachycardia ([place])   . Personal history of other infectious and parasitic disease   . Plantar fascial fibromatosis   . Pneumonia   . Syncope and collapse     Past  Surgical History:  Procedure Laterality Date  . CATARACT EXTRACTION    . CHOLECYSTECTOMY    . COLONOSCOPY    . colonscopy     status post  . CORONARY ANGIOPLASTY WITH STENT PLACEMENT    . ESOPHAGOGASTRODUODENOSCOPY     status post  . INSERTION OF MESH N/A 01/30/2017   Procedure: INSERTION OF MESH;  Surgeon: Georganna Skeans, MD;  Location: [facility];  Service: General;  Laterality: N/A;  . KNEE ARTHROSCOPY    . LEFT HEART CATHETERIZATION WITH CORONARY ANGIOGRAM  08/07/2011   Procedure: LEFT HEART CATHETERIZATION WITH CORONARY ANGIOGRAM;  Surgeon: Burnell Blanks, MD;  Location: [facility] CATH LAB;  Service: Cardiovascular;;  . UMBILICAL HERNIA REPAIR N/A 01/30/2017   Procedure: REPAIR OF UMBILICAL HERNIA;  Surgeon: Georganna Skeans, MD;  Location: [facility];  Service: General;  Laterality: N/A;    Current Outpatient Medications  Medication Sig Dispense Refill  . albuterol (PROVENTIL) (2.5 MG/3ML) 0.083% nebulizer solution Take 3 mLs (2.5 mg total) by nebulization every 6 (six) hours as needed for wheezing or shortness of breath. DX: J47.0 360 mL 11  . amLODipine (NORVASC) 10 MG tablet Take by mouth.    Marland Kitchen aspirin 325 MG tablet Take 325 mg by mouth daily.    . B Complex Vitamins (VITAMIN B COMPLEX PO) Take by mouth.    . Calcium Carbonate-Vitamin D (CALCIUM 600/VITAMIN D) 600-400 MG-UNIT per tablet Take 1 tablet by mouth 2 (two) times daily.      Marland Kitchen doxycycline (VIBRA-TABS) 100 MG tablet Take 1 tablet (100 mg total)  by mouth 2 (two) times daily. 14 tablet 2  . econazole nitrate 1 % cream Apply topically daily.    . hydrochlorothiazide (HYDRODIURIL) 25 MG tablet Take 25 mg by mouth daily.    . Melatonin 5 MG TABS Take by mouth.    Marland Kitchen MISC NATURAL PRODUCTS PO Take 20 mg by mouth 2 (two) times daily. CBD Gummy, uses for anxiety and sleep    . omeprazole (PRILOSEC) 20 MG capsule Take 20 mg by mouth as needed. Patient uses this medication for acid reflux.    Marland Kitchen oxyCODONE (OXY IR/ROXICODONE) 5 MG immediate release tablet Take 1-2 tablets (5-10 mg total) by mouth every 6 (six) hours as needed for severe pain. 25 tablet 0  . psyllium (METAMUCIL) 58.6 % powder Take 1 packet by mouth daily. Patient uses this mediation for regularity.    Marland Kitchen Respiratory Therapy Supplies (FLUTTER) DEVI Use as directed 1 each 0  . sodium chloride HYPERTONIC 3 % nebulizer solution Take by nebulization 2 (two) times daily. 750 mL 11  . tadalafil (CIALIS) 20 MG tablet Take 20 mg by mouth daily as needed. Patient uses this medication for ED.    . valsartan (DIOVAN) 160 MG tablet Take 160 mg by mouth daily.      Marland Kitchen atorvastatin (LIPITOR) 10 MG tablet Take 10 mg by mouth daily.     No current facility-administered medications for this visit.     Allergies as of 06/22/2017 - Review Complete 06/22/2017  Allergen Reaction Noted  . Amlodipine Swelling 03/28/2016    Vitals: BP 130/83   Pulse 90   Ht 5\' 11"  (3.151 m)   Wt 232 lb (105.2 kg)   BMI 32.36 kg/m  Last Weight:  Wt Readings from Last 1 Encounters:  06/22/17 232 lb (105.2 kg)   VOH:YWVP mass index is 32.36 kg/m.     Last Height:   Ht Readings from Last  1 Encounters:  06/22/17 5\' 11"  (1.803 m)    Physical exam:  General: The patient is awake, alert and appears not in acute distress. The patient is well groomed. Head: Normocephalic, atraumatic. Neck is supple. Mallampati 3 . Facial hair.  neck circumference: 17. 5 cm .  Nasal airflow patent , TMJ is not  evident .  Retrognathia is not seen.  Cardiovascular:  Regular rate and rhythm , without  murmurs or carotid bruit, and without distended neck veins. Respiratory: Lungs are clear to auscultation. Skin:  Without evidence of edema, or rash Trunk: BMI is 32. The patient's posture is erect.    Neurologic exam : The patient is awake and alert, oriented to place and time.   Attention span & concentration ability appears normal.  Speech is fluent, without dysarthria, dysphonia or aphasia. Mood and affect are appropriate.  Cranial nerves: Pupils are equal and briskly reactive to light. Funduscopic exam without evidence of pallor or edema.  Extraocular movements  in vertical and horizontal planes intact and without nystagmus. Visual fields by finger perimetry are intact. Hearing to finger rub intact.  Facial sensation intact to fine touch. Facial motor strength is symmetric and tongue and uvula move midline. Shoulder shrug was symmetrical.  Motor exam:  Normal tone, muscle bulk and symmetric strength in all extremities. Sensory:  Fine touch, pinprick and vibration were tested in all extremities. Proprioception tested in the upper extremities was normal. Coordination: Rapid alternating movements in the fingers/hands was normal. Finger-to-nose maneuver  normal without evidence of ataxia, dysmetria or tremor.Gait and station: Patient walks without assistive device and is able unassisted to climb up to the exam table. Strength within normal limits.Stance is stable and normal. Turns with 3 Steps. Romberg testing is negative. Deep tendon reflexes: in the  upper and lower extremities are symmetric and intact.   Assessment:  After physical and neurologic examination, review of laboratory studies,  Personal review of imaging studies, reports of other /same  Imaging studies, results of polysomnography and / or neurophysiology testing and pre-existing records as far as provided in visit., my assessment is   1) Mr. Hanton  would be considered at risk for a circadian rhythm disorder based on his very long exposure to nocturnal shift work.  However he seems to have makes a conversion was relatively greater ease.  Melatonin has helped him in the past and he could certainly use it as needed, I am not in any way opposed to CBD oil.  He feels that he is less anxious and has less ruminating thoughts.  He still has some issues with his daytime fatigue mild daytime sleepiness and observed snoring.  There is also nocturia which can be related to untreated obstructive sleep apnea.  Due to his history of tobacco use in the past there is also a possibility of hypoxemia but this was not evident in his sleep study from 10 years ago.  At this time I would like for the patient to undergo a split-night polysomnography should he have more than 20 apneas and hypopneas per hour of sleep we would treat him with CPAP.  I like for him to bring his melatonin was not.   We can make him late arrival patient for the second shift of sleep techs.  I will follow after the results of the sleep study have been interpreted.    The patient was advised of the nature of the diagnosed disorder , the treatment options and the  risks for general health  and wellness arising from not treating the condition.   I spent more than 45 minutes of face to face time with the patient.  Greater than 50% of time was spent in counseling and coordination of care. We have discussed the diagnosis and differential and I answered the patient's questions.    Plan:  Treatment plan and additional workup : SPLIT night study ordered.  Co-morbidities are related to tobacco use history, shift work and weight gain.      Larey Seat, MD 1/88/4166, 0:63 PM  Certified in Neurology by ABPN Certified in [place] by [facility] [address] [place], [place] [address]

## 2017-06-22 NOTE — Patient Instructions (Signed)

## 2017-06-23 NOTE — Progress Notes (Signed)
Cardiology Office Note    Date:  06/24/2017   ID:  Tommy Santiago, DOB February 08, 1946, MRN [id number]  PCP:  Bernerd Limbo, MD  Cardiologist: Lauree Chandler, MD  Chief Complaint  Patient presents with  . New Patient (Initial Visit)    History of Present Illness:  Tommy Santiago is a 72 y.o. male with history of CAD status post DES to the mid LAD 06/23/2008, last cath 2013 for chest pain stable disease, patent LAD stent.  Chest pain felt to be GI, HLD, former tobacco abuse, hepatitis C.  He cannot tolerate statins because of GI disease.  I saw Dr. Angelena Form in 2015 at which time he was not on a beta-blocker secondary to bradycardia, no statin because of GI disease no ACE inhibitor because of cough.  He was on aspirin and ARB.    Patient comes in for f/u. Retired surgical ICU nurse. A couple of months ago 1 hour after eating salsa & chips he had epigastric pressure took 1 sl NTG with some relief but present 10-15 min and then took antacid and pain eased within 30 min. Likes spicy food and takes omeprazole prn. Does 30 min on treadmill and 20 on eliptycal and lots of yard work without symptoms.  Walks 6000 steps daily without angina.  Denies chest tightness, dyspnea, dyspnea on exertion, dizziness or presyncope.  Is borderline diabetic with a hemoglobin A1c of 6.0 in February.Now on low dose Lipitor but LDL 126 05/2016 since his hepatitis was treated.     Past Medical History:  Diagnosis Date  . Bronchiectasis ([place])   . CAP (community acquired pneumonia)   . Coronary atherosclerosis of unspecified type of vessel, native or graft    status post cardiac cath w percutaneous coronary intervention using XIENCE drug-eluting stent to mid left anterior descending 07/23/08; LHC 08/07/11: Mid LAD stent patent, moderate size diagonal branch jailed by stent with 70% ostial stenosis, unchanged from 2010 with excellent flow down the diagonal branch, mid circumflex 30%, proximal and mid RCA 20%, EF 60%.   Medical therapy continued.   Marland Kitchen GERD (gastroesophageal reflux disease)   . Hepatitis C    Treated [place]  . Hyperlipemia   . Hypertension   . Hypopotassemia   . Lichen planus   . Other and unspecified hyperlipidemia   . Other specified cardiac dysrhythmias(427.89)   . Paroxysmal supraventricular tachycardia ([place])   . Personal history of other infectious and parasitic disease   . Plantar fascial fibromatosis   . Pneumonia   . Syncope and collapse     Past Surgical History:  Procedure Laterality Date  . CATARACT EXTRACTION    . CHOLECYSTECTOMY    . COLONOSCOPY    . colonscopy     status post  . CORONARY ANGIOPLASTY WITH STENT PLACEMENT    . ESOPHAGOGASTRODUODENOSCOPY     status post  . INSERTION OF MESH N/A 01/30/2017   Procedure: INSERTION OF MESH;  Surgeon: Georganna Skeans, MD;  Location: [facility];  Service: General;  Laterality: N/A;  . KNEE ARTHROSCOPY    . LEFT HEART CATHETERIZATION WITH CORONARY ANGIOGRAM  08/07/2011   Procedure: LEFT HEART CATHETERIZATION WITH CORONARY ANGIOGRAM;  Surgeon: Burnell Blanks, MD;  Location: [facility] CATH LAB;  Service: Cardiovascular;;  . UMBILICAL HERNIA REPAIR N/A 01/30/2017   Procedure: REPAIR OF UMBILICAL HERNIA;  Surgeon: Georganna Skeans, MD;  Location: [facility];  Service: General;  Laterality: N/A;    Current Medications: Current Meds  Medication  Sig  . albuterol (PROVENTIL) (2.5 MG/3ML) 0.083% nebulizer solution Take 3 mLs (2.5 mg total) by nebulization every 6 (six) hours as needed for wheezing or shortness of breath. DX: J47.0  . atorvastatin (LIPITOR) 10 MG tablet Take 1 tablet (10 mg total) by mouth daily.  . B Complex Vitamins (VITAMIN B COMPLEX PO) Take by mouth.  . Calcium Carbonate-Vitamin D (CALCIUM 600/VITAMIN D) 600-400 MG-UNIT per tablet Take 1 tablet by mouth 2 (two) times daily.    Marland Kitchen econazole nitrate 1 % cream Apply topically daily.  . hydrochlorothiazide (HYDRODIURIL) 25 MG tablet  Take 25 mg by mouth daily.  . Melatonin 5 MG TABS Take 5 mg by mouth as needed.   Marland Kitchen MISC NATURAL PRODUCTS PO Take 20 mg by mouth 2 (two) times daily. CBD Gummy, uses for anxiety and sleep  . nitroGLYCERIN (NITROSTAT) 0.3 MG SL tablet Place 0.3 mg under the tongue every 5 (five) minutes as needed for chest pain.  Marland Kitchen omeprazole (PRILOSEC) 20 MG capsule Take 20 mg by mouth as needed. Patient uses this medication for acid reflux.  . psyllium (METAMUCIL) 58.6 % powder Take 1 packet by mouth daily. Patient uses this mediation for regularity.  . sodium chloride HYPERTONIC 3 % nebulizer solution Take by nebulization 2 (two) times daily.  . tadalafil (CIALIS) 20 MG tablet Take 20 mg by mouth daily as needed. Patient uses this medication for ED.  . valsartan (DIOVAN) 160 MG tablet Take 160 mg by mouth daily.    . [DISCONTINUED] aspirin 325 MG tablet Take 325 mg by mouth daily.  . [DISCONTINUED] atorvastatin (LIPITOR) 10 MG tablet Take 10 mg by mouth daily.     Allergies:   Amlodipine   Social History   Socioeconomic History  . Marital status: Married    Spouse name: Not on file  . Number of children: Not on file  . Years of education: Not on file  . Highest education level: Not on file  Occupational History  . Occupation: Critical care RN  Social Needs  . Financial resource strain: Not on file  . Food insecurity:    Worry: Not on file    Inability: Not on file  . Transportation needs:    Medical: Not on file    Non-medical: Not on file  Tobacco Use  . Smoking status: Former Smoker    Packs/day: 2.00    Years: 25.00    Pack years: 50.00    Last attempt to quit: 1989    Years since quitting: 30.3  . Smokeless tobacco: Never Used  Substance and Sexual Activity  . Alcohol use: Yes    Comment: social  . Drug use: No    Comment: quit 25 years ago  . Sexual activity: Not on file  Lifestyle  . Physical activity:    Days per week: Not on file    Minutes per session: Not on file  .  Stress: Not on file  Relationships  . Social connections:    Talks on phone: Not on file    Gets together: Not on file    Attends religious service: Not on file    Active member of club or organization: Not on file    Attends meetings of clubs or organizations: Not on file    Relationship status: Not on file  Other Topics Concern  . Not on file  Social History Narrative  . Not on file     Family History:  The patient's family history includes  Coronary artery disease in his brother and other; Heart disease in his father; Rectal cancer in his brother.   ROS:   Please see the history of present illness.    Review of Systems  Constitution: Negative.  HENT: Negative.   Cardiovascular: Negative.   Respiratory: Negative.   Endocrine: Negative.   Hematologic/Lymphatic: Negative.   Musculoskeletal: Negative.   Gastrointestinal: Positive for heartburn.  Genitourinary: Negative.   Neurological: Negative.    All other systems reviewed and are negative.   PHYSICAL EXAM:   VS:  BP 110/70   Pulse 80   Ht 5\' 11"  (1.803 m)   Wt 232 lb (105.2 kg)   BMI 32.36 kg/m   Physical Exam  GEN: Obese, in no acute distress  Neck: no JVD, carotid bruits, or masses Cardiac:RRR; no murmurs, rubs, or gallops  Respiratory:  clear to auscultation bilaterally, normal work of breathing GI: soft, nontender, nondistended, + BS Ext: without cyanosis, clubbing, or edema, Good distal pulses bilaterally Neuro:  Alert and Oriented x 3 Psych: euthymic mood, full affect  Wt Readings from Last 3 Encounters:  06/24/17 232 lb (105.2 kg)  06/22/17 232 lb (105.2 kg)  05/12/17 233 lb (105.7 kg)      Studies/Labs Reviewed:   EKG:  EKG is  ordered today.  The ekg ordered today demonstrates normal sinus rhythm, normal EKG  Recent Labs: 07/10/2016: ALT 20 07/11/2016: Hemoglobin 13.1; Platelets 245 01/28/2017: BUN 14; Creatinine, Ser 0.94; Potassium 3.6; Sodium 134   Lipid Panel    Component Value Date/Time     CHOL 180 08/07/2011 0401   TRIG 78 08/07/2011 0401   HDL 37 (L) 08/07/2011 0401   CHOLHDL 4.9 08/07/2011 0401   VLDL 16 08/07/2011 0401   LDLCALC 127 (H) 08/07/2011 0401    Additional studies/ records that were reviewed today include:   Cardiac cath 08/07/11:  Left main: No obstructive disease.  Left Anterior Descending Artery: Large caliber vessel that courses to the apex. Patent stent mid LAD without restenosis. There is a moderate sized diagonal branch that is jailed by the stent with 70% ostial narrowing secondary to this. This appears the same as the final angiography after his stent placement in 2010. There is excellent flow down the diagonal branch.  Circumflex Artery: Large caliber vessel that gives off 2 marginal branches. There is 30% stenosis in the mid Circumflex between the takeoffs of the marginal branches. This not flow limiting.  Right Coronary Artery: Large, dominant vessel with smooth, 20% stenosis in the proximal and mid vesssel. No flow limiting lesions noted.  Left Ventricular Angiogram: LVEF 60%.       ASSESSMENT:    1. Atherosclerosis of native coronary artery of native heart without angina pectoris   2. Essential hypertension   3. Mixed hyperlipidemia   4. Hyperglycemia   5. Obesity (BMI 30-39.9)      PLAN:  In order of problems listed above:  CAD status post DES to the LAD in 2010 follow-up cath 2013 stable disease with patent LAD stent.  No beta-blocker secondary to bradycardia in the past.  One episode of chest pain after eating chips and salsa, partial relief from nitroglycerin total relief from antacids.  EKG normal.  No exertional symptoms.  Recommend surveillance and call if he has any recurrent symptoms.  Recommend increase Lipitor to 80 mg daily with follow-up lipids if Dr. Earlean Shawl concurs.  Otherwise referred to lipid clinic for other options.  Follow-up with Dr. Angelena Form in 1 year.  Essential hypertension controlled on valsartan, amlodipine and  HCTZ  Mixed hyperlipidemia now on low-dose Lipitor 10 mg after hepatitis was treated.  Check fasting lipid panel and LFTs.  To contact Dr. Earlean Shawl to see if he can take 80 mg daily.  Hyperglycemia with hemoglobin A1c of 6.0 borderline diabetic.  Recommend weight loss with diet and exercise.  Obesity weight loss essential with borderline diabetes.  Recommend weight loss program such as weight watchers.  Medication Adjustments/Labs and Tests Ordered: Current medicines are reviewed at length with the patient today.  Concerns regarding medicines are outlined above.  Medication changes, Labs and Tests ordered today are listed in the Patient Instructions below. Patient Instructions  Medication Instructions:  Your physician has recommended you make the following change in your medication:  1-Increase Lipitor 80 mg by mouth daily.  2-Decrease Aspirin 81 mg by mouth daily.  Labwork: Your physician recommends that you return for lab work tomorrow for fasting lipid and liver panel.  Your physician recommends that you return for lab work in: 6 weeks for fasting lipid and liver panel.  Testing/Procedures: NONE  Follow-Up: Your physician wants you to follow-up in: 12 months with Dr. Angelena Form. You will receive a reminder letter in the mail two months in advance. If you don't receive a letter, please call our office to schedule the follow-up appointment.  Your physician encouraged you to join a weight loss program, like Weight Watchers for better health.   If you need a refill on your cardiac medications before your next appointment, please call your pharmacy.       Sumner Boast, PA-C  06/24/2017 10:23 AM    [facility] [address], [place], [place]  [address] Phone: [phone]; Fax: [phone]

## 2017-06-24 ENCOUNTER — Ambulatory Visit: Payer: PPO | Admitting: Physician Assistant

## 2017-06-24 ENCOUNTER — Encounter: Payer: Self-pay | Admitting: Physician Assistant

## 2017-06-24 VITALS — BP 110/70 | HR 80 | Ht 71.0 in | Wt 232.0 lb

## 2017-06-24 DIAGNOSIS — E782 Mixed hyperlipidemia: Secondary | ICD-10-CM | POA: Diagnosis not present

## 2017-06-24 DIAGNOSIS — I2510 Atherosclerotic heart disease of native coronary artery without angina pectoris: Secondary | ICD-10-CM

## 2017-06-24 DIAGNOSIS — R739 Hyperglycemia, unspecified: Secondary | ICD-10-CM

## 2017-06-24 DIAGNOSIS — I10 Essential (primary) hypertension: Secondary | ICD-10-CM | POA: Diagnosis not present

## 2017-06-24 DIAGNOSIS — E669 Obesity, unspecified: Secondary | ICD-10-CM

## 2017-06-24 MED ORDER — ASPIRIN EC 81 MG PO TBEC: 81.0000 mg | DELAYED_RELEASE_TABLET | Freq: Every day | ORAL | Status: DC

## 2017-06-24 MED ORDER — ATORVASTATIN CALCIUM 10 MG PO TABS
10.0000 mg | ORAL_TABLET | Freq: Every day | ORAL | 3 refills | Status: DC
Start: 2017-06-24 — End: 2017-07-13

## 2017-06-24 NOTE — Patient Instructions (Addendum)
Medication Instructions:  Your physician has recommended you make the following change in your medication:  1-Increase Lipitor 80 mg by mouth daily.  2-Decrease Aspirin 81 mg by mouth daily.  Labwork: Your physician recommends that you return for lab work in 2 weeks for fasting lipid and liver panel.  Your physician recommends that you return for lab work in: 8 weeks for fasting lipid and liver panel.  Testing/Procedures: NONE  Follow-Up: Your physician wants you to follow-up in: 12 months with Dr. Angelena Form. You will receive a reminder letter in the mail two months in advance. If you don't receive a letter, please call our office to schedule the follow-up appointment.  Your physician encouraged you to join a weight loss program, like Weight Watchers for better health.   If you need a refill on your cardiac medications before your next appointment, please call your pharmacy.

## 2017-07-10 ENCOUNTER — Other Ambulatory Visit: Payer: PPO

## 2017-07-10 DIAGNOSIS — I2510 Atherosclerotic heart disease of native coronary artery without angina pectoris: Secondary | ICD-10-CM | POA: Diagnosis not present

## 2017-07-10 DIAGNOSIS — I10 Essential (primary) hypertension: Secondary | ICD-10-CM

## 2017-07-10 DIAGNOSIS — E782 Mixed hyperlipidemia: Secondary | ICD-10-CM

## 2017-07-10 DIAGNOSIS — R739 Hyperglycemia, unspecified: Secondary | ICD-10-CM | POA: Diagnosis not present

## 2017-07-10 LAB — LIPID PANEL
CHOLESTEROL TOTAL: 151 mg/dL (ref 100–199)
Chol/HDL Ratio: 4.7 ratio (ref 0.0–5.0)
HDL: 32 mg/dL — ABNORMAL LOW (ref >39)
LDL Calculated: 103 mg/dL — ABNORMAL HIGH (ref 0–99)
Triglycerides: 78 mg/dL (ref 0–149)
VLDL Cholesterol Cal: 16 mg/dL (ref 5–40)

## 2017-07-10 LAB — HEPATIC FUNCTION PANEL
ALK PHOS: 67 IU/L (ref 39–117)
ALT: 26 IU/L (ref 0–44)
AST: 21 IU/L (ref 0–40)
Albumin: 4.6 g/dL (ref 3.5–4.8)
BILIRUBIN TOTAL: 0.6 mg/dL (ref 0.0–1.2)
BILIRUBIN, DIRECT: 0.13 mg/dL (ref 0.00–0.40)
Total Protein: 6.8 g/dL (ref 6.0–8.5)

## 2017-07-13 ENCOUNTER — Telehealth: Payer: Self-pay | Admitting: Physician Assistant

## 2017-07-13 ENCOUNTER — Telehealth: Payer: Self-pay

## 2017-07-13 DIAGNOSIS — I10 Essential (primary) hypertension: Secondary | ICD-10-CM

## 2017-07-13 DIAGNOSIS — E785 Hyperlipidemia, unspecified: Secondary | ICD-10-CM

## 2017-07-13 DIAGNOSIS — E782 Mixed hyperlipidemia: Secondary | ICD-10-CM

## 2017-07-13 DIAGNOSIS — I2510 Atherosclerotic heart disease of native coronary artery without angina pectoris: Secondary | ICD-10-CM

## 2017-07-13 MED ORDER — ATORVASTATIN CALCIUM 20 MG PO TABS: 20.0000 mg | ORAL_TABLET | Freq: Every day | ORAL | 3 refills | Status: DC

## 2017-07-13 MED FILL — ATORVASTATIN 20 MG TABLET: 20 | 90 days supply | Qty: 90 | Fill #0

## 2017-07-13 NOTE — Telephone Encounter (Signed)
Called and made patient aware of results and recommendations to increase atorvastatin to 20 mg QD and repeat LIPID/LFTS in 3 months. Rx sent to preferred pharmacy and lab appointment made for 8/20.

## 2017-07-13 NOTE — Telephone Encounter (Signed)
-----   Message from Imogene Burn, PA-C sent at 07/13/2017  7:43 AM EDT ----- LDL 103. Try to increase lipitor to 20 mg daily. Want LDL below 70. Liver functions normal. Repeat lipids and liver in 3 months

## 2017-07-13 NOTE — Telephone Encounter (Signed)
lmtcb for lab results and recommendations 

## 2017-07-13 NOTE — Telephone Encounter (Signed)
New Message ° ° ° °Patient is returning call in reference to lab results. Please call.  °

## 2017-07-23 ENCOUNTER — Other Ambulatory Visit: Payer: Self-pay | Admitting: Neurology

## 2017-07-23 ENCOUNTER — Telehealth: Payer: Self-pay

## 2017-07-23 DIAGNOSIS — R0683 Snoring: Secondary | ICD-10-CM

## 2017-07-23 DIAGNOSIS — G4726 Circadian rhythm sleep disorder, shift work type: Secondary | ICD-10-CM

## 2017-07-23 NOTE — Telephone Encounter (Signed)
error 

## 2017-07-30 MED FILL — VALSARTAN 160 MG TABLET: 160 | 90 days supply | Qty: 90 | Fill #2

## 2017-07-30 MED FILL — AMLODIPINE BESYLATE 10 MG T: 10 | 30 days supply | Qty: 30 | Fill #2

## 2017-08-07 ENCOUNTER — Ambulatory Visit (INDEPENDENT_AMBULATORY_CARE_PROVIDER_SITE_OTHER): Payer: PPO | Admitting: Neurology

## 2017-08-07 DIAGNOSIS — G4734 Idiopathic sleep related nonobstructive alveolar hypoventilation: Secondary | ICD-10-CM

## 2017-08-07 DIAGNOSIS — G4733 Obstructive sleep apnea (adult) (pediatric): Secondary | ICD-10-CM

## 2017-08-07 DIAGNOSIS — G4710 Hypersomnia, unspecified: Secondary | ICD-10-CM

## 2017-08-07 DIAGNOSIS — G4726 Circadian rhythm sleep disorder, shift work type: Secondary | ICD-10-CM

## 2017-08-07 DIAGNOSIS — G4730 Sleep apnea, unspecified: Secondary | ICD-10-CM

## 2017-08-07 DIAGNOSIS — R0683 Snoring: Secondary | ICD-10-CM

## 2017-08-11 MED ORDER — ALPRAZOLAM 0.5 MG PO TABS: 0.5000 mg | ORAL_TABLET | Freq: Every evening | ORAL | 0 refills | Status: DC | PRN

## 2017-08-11 NOTE — Addendum Note (Signed)
Addended by: Larey Seat on: 08/11/2017 07:33 PM   Modules accepted: Orders

## 2017-08-11 NOTE — Procedures (Signed)
PATIENT'S NAME:  Tommy Santiago, Tommy Santiago DOB:      1945-03-12      MR#:    [id number]     DATE OF RECORDING: 08/07/2017 REFERRING M.D.:  Bernerd Limbo, M.D. 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  He has been a shift Insurance underwriter.  He carries a diagnosis of bronchiectasis without complications but at one time had community-acquired pneumonia, is diagnosed with coronary atherosclerosis, closed compression fracture of the second lumbar vertebra, GERD, hepatitis C ,  cataract surgery, hypertension, hyperlipidemia, lichen planus, nocturia and was overweight. He was diagnosed as having osteopenia. He used to smoke, and has at one time and entry in his medical chart for paroxysmal supraventricular tachycardia.    The patient endorsed the Epworth Sleepiness Scale at 9 points, FSS at 39.   The patient's weight 232 pounds with a height of 71 (inches), resulting in a BMI of 32.4 kg/m2. The patient's neck circumference measured 17.5 inches.  CURRENT MEDICATIONS: Proventil, Norvasc, Aspirin, Vitamin B, Vitamin D, Vibra-Tabs, HCTZ, Prilosec, Oxycodone, Metamucil, Cialis, Diovan, Lipitor, sodium chloride hypertonic   PROCEDURE:  This is a multichannel digital polysomnogram utilizing the Somnostar 11.2 system.  Electrodes and sensors were applied and monitored per AASM Specifications.   EEG, EOG, Chin and Limb EMG, were sampled at 200 Hz.  ECG, Snore and Nasal Pressure, Thermal Airflow, Respiratory Effort, CPAP Flow and Pressure, Oximetry was sampled at 50 Hz. Digital video and audio were recorded.      BASELINE STUDY; Lights Out was at 21:49 and Lights On at 05:02.  Total recording time (TRT) was 433.5 minutes, with a total sleep time (TST) of 325.5 minutes. The patient's sleep latency was 24 minutes.  REM latency was 114 minutes.  The sleep efficiency was 75.1 %.     SLEEP ARCHITECTURE: WASO  (Wake after sleep onset) was 55 minutes.  There were 27.5 minutes in Stage N1, 261.5 minutes Stage N2, 0 minutes Stage N3 and 36.5 minutes in Stage REM.  The percentage of Stage N1 was 8.4%, Stage N2 was 80.3%, Stage N3 was 0% and Stage R (REM sleep) was 11.2%.   RESPIRATORY ANALYSIS:  There were a total of 57 respiratory events:  8 obstructive apneas, 3 central apneas and 2 mixed apneas with a total of 13 apneas and an apnea index (AI) of 2.4 /hour. There were 44 hypopneas with a hypopnea index of 8.1 /hour. The patient also had 0 respiratory event related arousals (RERAs). The total APNEA/HYPOPNEA INDEX (AHI) was 10.5 /hour and the total RESPIRATORY DISTURBANCE INDEX was 10.5 /hour.  14 events occurred in REM sleep and 81 events in NREM. The REM AHI was 23.0 /hour, versus a non-REM AHI of 8.9. The patient spent 87.5 minutes of total sleep time in the supine position and 238 minutes in non-supine. The supine AHI was 22.0 versus a non-supine AHI of 6.3.  OXYGEN SATURATION & C02:  The Wake baseline 02 saturation was 98%, with the lowest being 60%. Time spent below 89% saturation equaled 61 minutes.    PERIODIC LIMB MOVEMENTS:  The patient had a total of 49 Periodic Limb Movements.  The Periodic Limb Movement (PLM) index was 9.0/h and the PLM Arousal index was 0.7/hour. The arousals were noted as: 36 were spontaneous, 4 were associated with PLMs, and only 12 were associated with respiratory events.  Audio and video analysis did not  show any abnormal or unusual movements, behaviors, phonations or vocalizations. The patient took two bathroom breaks. Mild to moderate Snoring was noted. EKG was in keeping with normal sinus rhythm (NSR). Post-study, the patient indicated that sleep was worse than usual.    IMPRESSION:  1. Mild sleep apnea at AHI 10.5, REM AHI of 23.0, supine positional AHI of 22 /h and associated with prolonged hypoxemia during REM sleep.  2. Mild Periodic Limb Movement Disorder  (PLMD) 3. Primary Snoring   RECOMMENDATIONS:    Return for full night CPAP titration (and oxygen titration should hypoxemia does not respond to CPAP therapy alone).  I will offer the patient 0.5mg  Xanax to be taken as needed for sleep induction.      I certify that I have reviewed the entire raw data recording prior to the issuance of this report in accordance with the Standards of Accreditation of the American Academy of Sleep Medicine (AASM)     Larey Seat, MD   08-11-2017  Diplomat, American Board of Psychiatry and Neurology  Diplomat, American Board of [place] Director, [employer] [facility] [employer]

## 2017-08-12 ENCOUNTER — Telehealth: Payer: Self-pay | Admitting: Neurology

## 2017-08-12 MED FILL — ALPRAZolam 0.5 MG TABS: 0.5 | 2 days supply | Qty: 2 | Fill #0

## 2017-08-12 NOTE — Telephone Encounter (Signed)
I called pt. I advised pt that Dr. Brett Fairy reviewed their sleep study results and found that has sleep apnea and recommends that pt be treated with a cpap. Dr. Brett Fairy recommends that pt return for a repeat sleep study in order to properly titrate the cpap and ensure a good mask fit. Pt is agreeable to returning for a titration study. I advised pt that our sleep lab will file with pt's insurance and call pt to schedule the sleep study when we hear back from the pt's insurance regarding coverage of this sleep study. Pt verbalized understanding of results. Dr Dohmeier offered the patient medication to help him wind down for bed. I will send that to the pharmacy on file and verified that with the pt. Pt had no questions at this time but was encouraged to call back if questions arise.

## 2017-08-12 NOTE — Telephone Encounter (Signed)
-----   Message from Larey Seat, MD sent at 08/11/2017  7:33 PM EDT ----- Cc Dr Coletta Memos, Please fax or mail these results. IMPRESSION:  1. Mild sleep apnea at AHI 10.5, REM AHI of 23.0, supine  positional AHI of 22 /h and associated with prolonged hypoxemia  during REM sleep.  2. Mild Periodic Limb Movement Disorder (PLMD) 3. Primary Snoring   RECOMMENDATIONS:   Return for full night CPAP titration (and oxygen titration should  hypoxemia does not respond to CPAP therapy alone).  I will offer the patient 0.5mg  Xanax to be taken as needed for  sleep induction.

## 2017-08-31 MED FILL — AMLODIPINE BESYLATE 10 MG T: 10 | 30 days supply | Qty: 30 | Fill #3

## 2017-08-31 MED FILL — HYDROCHLOROTHIAZIDE 25 MG T: 25 | 90 days supply | Qty: 90 | Fill #3

## 2017-09-02 ENCOUNTER — Other Ambulatory Visit: Payer: PPO

## 2017-09-22 ENCOUNTER — Ambulatory Visit (INDEPENDENT_AMBULATORY_CARE_PROVIDER_SITE_OTHER): Payer: PPO | Admitting: Neurology

## 2017-09-22 DIAGNOSIS — R0683 Snoring: Secondary | ICD-10-CM | POA: Diagnosis not present

## 2017-09-22 DIAGNOSIS — G4733 Obstructive sleep apnea (adult) (pediatric): Secondary | ICD-10-CM | POA: Diagnosis not present

## 2017-09-22 DIAGNOSIS — G4734 Idiopathic sleep related nonobstructive alveolar hypoventilation: Secondary | ICD-10-CM

## 2017-09-22 DIAGNOSIS — G4726 Circadian rhythm sleep disorder, shift work type: Secondary | ICD-10-CM

## 2017-09-25 NOTE — Procedures (Signed)
PATIENT'S NAME:  Gabrien, Mentink DOB:      Sep 07, 1945      MR#:    [id number]     DATE OF RECORDING: 09/22/2017 REFERRING M.D.:  Bernerd Limbo, M.D. Study Performed:   CPAP  Titration HISTORY:  Patient is returning for a CPAP Titration after his Baseline PSG on 08/07/17 documented an AHI of 10.5/h, a REM AHI of 23/h, and a supine AHI of 22/h. there was prolonged hypoxemia, too. Concern about opiate induced hypoventilation.   The patient endorsed the Epworth Sleepiness Scale at 9/24 points.   The patient's weight 232 pounds with a height of 71 (inches), resulting in a BMI of 32.4 kg/m2. The patient's neck circumference measured 17.5 inches.  CURRENT MEDICATIONS: Proventil, Norvasc, Aspirin, Vitamin B, Vitamin D, HCTZ, Prilosec, Oxycodone, Metamucil, Cialis, Diovan, Lipitor,   PROCEDURE:  This is a multichannel digital polysomnogram utilizing the SomnoStar 11.2 system.  Electrodes and sensors were applied and monitored per AASM Specifications.   EEG, EOG, Chin and Limb EMG, were sampled at 200 Hz.  ECG, Snore and Nasal Pressure, Thermal Airflow, Respiratory Effort, CPAP Flow and Pressure, Oximetry was sampled at 50 Hz. Digital video and audio were recorded.      CPAP was initiated at 5 cmH20 with heated humidity per AASM split night standards and pressure was advanced to 6/7cmH20 because of hypopneas, apneas and desaturations.  At a PAP pressure of 8 cmH20, there was a reduction of the AHI to 0.7/h with improvement of sleep apnea.  Lights Out was at 22:26 and Lights On at 05:14. Total recording time (TRT) was 408.5 minutes, with a total sleep time (TST) of 375 minutes. The patient's sleep latency was 4 minutes. REM latency was 51.5 minutes.  The sleep efficiency was 91.8 %.    SLEEP ARCHITECTURE: WASO (Wake after sleep onset) was 28.5 minutes.  There were 5.5 minutes in Stage N1, 194.5 minutes Stage N2, 91.5 minutes Stage N3 and 83.5 minutes in Stage REM.  The percentage of Stage N1 was 1.5%, Stage N2  was 51.9%, Stage N3 was 24.4% and Stage R (REM sleep) was 22.3%.   RESPIRATORY ANALYSIS:  There was a total of 11 respiratory events: 1 obstructive apnea, 1 central apnea and 0 mixed apneas with 9 hypopneas. The patient also had 0 respiratory event related arousals (RERAs).     The total APNEA/HYPOPNEA INDEX  (AHI) was 1.8 /hour and the total RESPIRATORY DISTURBANCE INDEX was 1.8/hour  4 events occurred in REM sleep and 7 events in NREM. The REM AHI was 2.9 /hour versus a non-REM AHI of 1.4 /hour.  The patient spent 70 minutes of total sleep time in the supine position and 305 minutes in non-supine. The supine AHI was 4.3, versus a non-supine AHI of 1.2.  OXYGEN SATURATION & C02:  The baseline 02 saturation was 94%, with the lowest being 88%. Time spent below 89% saturation equaled 5 minutes.  PERIODIC LIMB MOVEMENTS:  The patient had a total of 317 Periodic Limb Movements. The Periodic Limb Movement (PLM) index was 50.7 and the PLM Arousal index was 2.2 /hour.  Audio and video analysis did not show any abnormal or unusual movements, behaviors, phonations or vocalizations.   The patient took one bathroom break. EKG was in keeping with normal sinus rhythm (NSR). Post-study, the patient indicated that sleep was improved. The Technologist noted the type of mask but not the size- ResMed AirFit P10.   DIAGNOSIS 1. Primary Snoring, Obstructive Sleep Apnea and Sleep Related  Hypoxemia were responding to 8 cm water pressure- and may benefit from a little more pressure.  2. Normal EKG.  PLANS/RECOMMENDATIONS:  Auto- titration CPAP with a pressure window from 5-10 cm water pressure and nasal pillow interface, heated humidification.   The patient should avoid evening sedatives, hypnotics, and alcohol beverage consumption A follow up appointment will be scheduled in the Sleep Clinic at [facility].   Please call [phone] with any questions.      I certify that I have reviewed the  entire raw data recording prior to the issuance of this report in accordance with the Standards of Accreditation of the American Academy of Sleep Medicine (AASM)   Larey Seat, M.D.  09-25-2017 Diplomat, American Board of Psychiatry and Neurology  Diplomat, [place] of Sleep Medicine Medical Director, [place] [facility] at [facility]

## 2017-09-25 NOTE — Addendum Note (Signed)
Addended by: Larey Seat on: 09/25/2017 01:57 PM   Modules accepted: Orders

## 2017-09-28 ENCOUNTER — Telehealth: Payer: Self-pay | Admitting: Neurology

## 2017-09-28 NOTE — Telephone Encounter (Signed)
-----   Message from Larey Seat, MD sent at 09/25/2017  1:57 PM EDT ----- DIAGNOSIS 1. Primary Snoring, Obstructive Sleep Apnea and Sleep Related  Hypoxemia were responding to 8 cm water pressure- and may benefit  from a little more pressure.  2. Normal EKG.  PLANS/RECOMMENDATIONS:  Auto- titration CPAP with a pressure window from 5-10 cm water  pressure and nasal pillow interface, heated humidification.   The patient should avoid evening sedatives, hypnotics, and  alcohol beverage consumption

## 2017-09-28 NOTE — Telephone Encounter (Signed)
I called pt. I advised pt that Dr. Brett Fairy reviewed their sleep study results and found that pt was able to tolerate the pressure at 8 cm of water pressure. Dr. Brett Fairy recommends that pt starts a auto CPAP machine 5-10 cm of water. I reviewed PAP compliance expectations with the pt. Pt is agreeable to starting a CPAP. I advised pt that an order will be sent to a DME, [facility], and [facility] will call the pt within about one week after they file with the pt's insurance. [facility] will show the pt how to use the machine, fit for masks, and troubleshoot the CPAP if needed. A follow up appt was made for insurance purposes with Dr. Brett Fairy on Oct 17,2019 at 8:30 am. Pt verbalized understanding to arrive 15 minutes early and bring their CPAP. A letter with all of this information in it will be mailed to the pt as a reminder. I verified with the pt that the address we have on file is correct. Pt verbalized understanding of results. Pt had no questions at this time but was encouraged to call back if questions arise.

## 2017-09-30 MED FILL — AMLODIPINE BESYLATE 10 MG T: 10 | 30 days supply | Qty: 30 | Fill #4

## 2017-09-30 MED FILL — ATORVASTATIN CALCIUM 20 MG: 20 | 90 days supply | Qty: 90 | Fill #1

## 2017-10-01 ENCOUNTER — Encounter (HOSPITAL_COMMUNITY): Payer: Self-pay | Admitting: Emergency Medicine

## 2017-10-01 ENCOUNTER — Ambulatory Visit (HOSPITAL_COMMUNITY)
Admission: EM | Admit: 2017-10-01 | Discharge: 2017-10-01 | Disposition: A | Discharge status: Home / Self Care | Payer: PPO | Attending: Family Medicine | Admitting: Family Medicine

## 2017-10-01 ENCOUNTER — Ambulatory Visit (INDEPENDENT_AMBULATORY_CARE_PROVIDER_SITE_OTHER): Payer: PPO

## 2017-10-01 DIAGNOSIS — M25561 Pain in right knee: Secondary | ICD-10-CM | POA: Diagnosis not present

## 2017-10-01 DIAGNOSIS — S8991XA Unspecified injury of right lower leg, initial encounter: Secondary | ICD-10-CM | POA: Diagnosis not present

## 2017-10-01 DIAGNOSIS — S86911A Strain of unspecified muscle(s) and tendon(s) at lower leg level, right leg, initial encounter: Secondary | ICD-10-CM | POA: Diagnosis not present

## 2017-10-01 NOTE — ED Triage Notes (Signed)
Pt states he went dancing Saturday night and twisted his knee, felt pain in his R knee the next morning. Pt states its been hurting ever since, walking with cane, using motrin without relief.

## 2017-10-01 NOTE — ED Provider Notes (Signed)
[facility]   [id number] 10/01/17 Arrival Time: 0867  ASSESSMENT & PLAN:  1. Strain of right knee, initial encounter     Imaging: Dg Knee Complete 4 Views Right  Result Date: 10/01/2017 CLINICAL DATA:  72 y/o M; twisting injury with pain around the anteromedial condyle of the femur. EXAM: RIGHT KNEE - COMPLETE 4+ VIEW COMPARISON:  None. FINDINGS: No evidence of fracture, dislocation, or joint effusion. No evidence of arthropathy or other focal bone abnormality. Vascular calcifications noted. IMPRESSION: No acute fracture or dislocation identified. Electronically Signed   By: Kristine Garbe M.D.   On: 10/01/2017 13:47     Discharge Instructions     Continue to take ibuprofen 600-800mg  every 8 hours with food.      Follow-up Information    Bernerd Limbo, MD.   Specialty:  Family Medicine Why:  As needed. Contact information: [address] [place] [address] [phone]           Reviewed expectations re: course of current medical issues. Questions answered. Outlined signs and symptoms indicating need for more acute intervention. Patient verbalized understanding. After Visit Summary given.  SUBJECTIVE: History from: patient. Tommy Santiago is a 72 y.o. male who reports fairly persistent mild to moderate pain of his right knee that is gradually improving; described as aching with occasional sharp pain without radiation. Onset: slowly approx 5 days ago; noticed medial discomfort after dancing the night before. No locking or giving out of knee. No swelling. Trama: no. Relieved by: rest and walking with cane. Worsened by: certain movements. Associated symptoms: none reported. Extremity sensation changes or weakness: none. Self treatment: ibuprofen with mild help. History of similar: no  ROS: As per HPI.   OBJECTIVE:  Vitals:   10/01/17 1314  BP: 130/77  Pulse: 80  Resp: 16  Temp: 98.1 F (36.7 C)  SpO2: 99%      General appearance: alert; no distress Extremities: warm and well perfused; symmetrical with no gross deformities; poorly localized discomfort over medial R knee (mostly over MCL distribution) with no swelling and no bruising; ROM: normal without instability CV: normal extremity capillary refill Skin: warm and dry Neurologic: normal gait; normal symmetric reflexes in all extremities; normal sensation in all extremities Psychological: alert and cooperative; normal mood and affect  Allergies  Allergen Reactions  . Amlodipine Swelling    Ankle swelling    Past Medical History:  Diagnosis Date  . Bronchiectasis ([place])   . CAP (community acquired pneumonia)   . Coronary atherosclerosis of unspecified type of vessel, native or graft    status post cardiac cath w percutaneous coronary intervention using XIENCE drug-eluting stent to mid left anterior descending 07/23/08; LHC 08/07/11: Mid LAD stent patent, moderate size diagonal branch jailed by stent with 70% ostial stenosis, unchanged from 2010 with excellent flow down the diagonal branch, mid circumflex 30%, proximal and mid RCA 20%, EF 60%.  Medical therapy continued.   Marland Kitchen GERD (gastroesophageal reflux disease)   . Hepatitis C    Treated [facility]  . Hyperlipemia   . Hypertension   . Hypopotassemia   . Lichen planus   . Other and unspecified hyperlipidemia   . Other specified cardiac dysrhythmias(427.89)   . Paroxysmal supraventricular tachycardia ([place])   . Personal history of other infectious and parasitic disease   . Plantar fascial fibromatosis   . Pneumonia   . Syncope and collapse    Social History   Socioeconomic History  . Marital status: Married  Spouse name: Not on file  . Number of children: Not on file  . Years of education: Not on file  . Highest education level: Not on file  Occupational History  . Occupation: Critical care RN  Social Needs  . Financial resource strain: Not on file  . Food insecurity:    Worry:  Not on file    Inability: Not on file  . Transportation needs:    Medical: Not on file    Non-medical: Not on file  Tobacco Use  . Smoking status: Former Smoker    Packs/day: 2.00    Years: 25.00    Pack years: 50.00    Last attempt to quit: 1989    Years since quitting: 30.6  . Smokeless tobacco: Never Used  Substance and Sexual Activity  . Alcohol use: Yes    Comment: social  . Drug use: No    Comment: quit 25 years ago  . Sexual activity: Not on file  Lifestyle  . Physical activity:    Days per week: Not on file    Minutes per session: Not on file  . Stress: Not on file  Relationships  . Social connections:    Talks on phone: Not on file    Gets together: Not on file    Attends religious service: Not on file    Active member of club or organization: Not on file    Attends meetings of clubs or organizations: Not on file    Relationship status: Not on file  . Intimate partner violence:    Fear of current or ex partner: Not on file    Emotionally abused: Not on file    Physically abused: Not on file    Forced sexual activity: Not on file  Other Topics Concern  . Not on file  Social History Narrative  . Not on file   Family History  Problem Relation Age of Onset  . Heart disease Father   . Coronary artery disease Brother        diagnosed in early 56s  . Rectal cancer Brother   . Coronary artery disease Other        hx family   Past Surgical History:  Procedure Laterality Date  . CATARACT EXTRACTION    . CHOLECYSTECTOMY    . COLONOSCOPY    . colonscopy     status post  . CORONARY ANGIOPLASTY WITH STENT PLACEMENT    . ESOPHAGOGASTRODUODENOSCOPY     status post  . INSERTION OF MESH N/A 01/30/2017   Procedure: INSERTION OF MESH;  Surgeon: Georganna Skeans, MD;  Location: [facility];  Service: General;  Laterality: N/A;  . KNEE ARTHROSCOPY    . LEFT HEART CATHETERIZATION WITH CORONARY ANGIOGRAM  08/07/2011   Procedure: LEFT HEART CATHETERIZATION  WITH CORONARY ANGIOGRAM;  Surgeon: Burnell Blanks, MD;  Location: [facility] C[facility] LAB;  Service: Cardiovascular;;  . UMBILICAL HERNIA REPAIR N/A 01/30/2017   Procedure: REPAIR OF UMBILICAL HERNIA;  Surgeon: Georganna Skeans, MD;  Location: [facility];  Service: General;  Laterality: Ginette Pitman, MD 10/01/17 [phone]

## 2017-10-01 NOTE — Discharge Instructions (Addendum)
Continue to take ibuprofen 600-800mg  every 8 hours with food.

## 2017-10-13 ENCOUNTER — Other Ambulatory Visit: Payer: PPO

## 2017-10-13 DIAGNOSIS — E782 Mixed hyperlipidemia: Secondary | ICD-10-CM | POA: Diagnosis not present

## 2017-10-13 DIAGNOSIS — G4733 Obstructive sleep apnea (adult) (pediatric): Secondary | ICD-10-CM | POA: Diagnosis not present

## 2017-10-13 LAB — HEPATIC FUNCTION PANEL
ALT: 23 IU/L (ref 0–44)
AST: 17 IU/L (ref 0–40)
Albumin: 4.2 g/dL (ref 3.5–4.8)
Alkaline Phosphatase: 57 IU/L (ref 39–117)
BILIRUBIN TOTAL: 0.5 mg/dL (ref 0.0–1.2)
Bilirubin, Direct: 0.21 mg/dL (ref 0.00–0.40)
TOTAL PROTEIN: 6.7 g/dL (ref 6.0–8.5)

## 2017-10-13 LAB — LIPID PANEL
CHOL/HDL RATIO: 4.9 ratio (ref 0.0–5.0)
Cholesterol, Total: 136 mg/dL (ref 100–199)
HDL: 28 mg/dL — ABNORMAL LOW (ref >39)
LDL CALC: 89 mg/dL (ref 0–99)
TRIGLYCERIDES: 97 mg/dL (ref 0–149)
VLDL CHOLESTEROL CAL: 19 mg/dL (ref 5–40)

## 2017-10-14 ENCOUNTER — Telehealth: Payer: Self-pay | Admitting: Physician Assistant

## 2017-10-14 DIAGNOSIS — E785 Hyperlipidemia, unspecified: Secondary | ICD-10-CM

## 2017-10-14 NOTE — Telephone Encounter (Signed)
New Message ° ° ° ° ° ° ° ° ° °Patient returned your call °

## 2017-10-14 NOTE — Telephone Encounter (Signed)
-----   Message from Imogene Burn, PA-C sent at 10/14/2017  8:55 AM EDT ----- Liver normal and LDL 89 which is better but not quite at goal.  Would continue same dose Lipitor for now.  Recheck lipids and LFTs in 6 months.

## 2017-10-15 MED FILL — DOXYCYCLINE HYCLATE 100 MG: 100 | 7 days supply | Qty: 14 | Fill #1

## 2017-10-28 DIAGNOSIS — D229 Melanocytic nevi, unspecified: Secondary | ICD-10-CM | POA: Diagnosis not present

## 2017-10-28 DIAGNOSIS — L814 Other melanin hyperpigmentation: Secondary | ICD-10-CM | POA: Diagnosis not present

## 2017-10-28 DIAGNOSIS — L819 Disorder of pigmentation, unspecified: Secondary | ICD-10-CM | POA: Diagnosis not present

## 2017-10-28 DIAGNOSIS — L821 Other seborrheic keratosis: Secondary | ICD-10-CM | POA: Diagnosis not present

## 2017-10-28 DIAGNOSIS — D1801 Hemangioma of skin and subcutaneous tissue: Secondary | ICD-10-CM | POA: Diagnosis not present

## 2017-10-29 ENCOUNTER — Other Ambulatory Visit: Payer: Self-pay

## 2017-10-29 ENCOUNTER — Ambulatory Visit (INDEPENDENT_AMBULATORY_CARE_PROVIDER_SITE_OTHER): Payer: PPO

## 2017-10-29 ENCOUNTER — Ambulatory Visit: Payer: PPO | Admitting: Podiatry

## 2017-10-29 ENCOUNTER — Other Ambulatory Visit: Payer: Self-pay | Admitting: Podiatry

## 2017-10-29 ENCOUNTER — Encounter: Payer: Self-pay | Admitting: Podiatry

## 2017-10-29 VITALS — BP 123/72 | HR 63

## 2017-10-29 DIAGNOSIS — M722 Plantar fascial fibromatosis: Secondary | ICD-10-CM

## 2017-10-29 DIAGNOSIS — M79672 Pain in left foot: Secondary | ICD-10-CM | POA: Diagnosis not present

## 2017-10-29 DIAGNOSIS — M25461 Effusion, right knee: Secondary | ICD-10-CM | POA: Diagnosis not present

## 2017-10-29 DIAGNOSIS — M79671 Pain in right foot: Secondary | ICD-10-CM | POA: Diagnosis not present

## 2017-10-29 DIAGNOSIS — Z23 Encounter for immunization: Secondary | ICD-10-CM | POA: Diagnosis not present

## 2017-10-29 MED ORDER — TRIAMCINOLONE ACETONIDE 10 MG/ML IJ SUSP
10.0000 mg | Freq: Once | INTRAMUSCULAR | Status: AC
  Administered 2017-10-29: 10 mg

## 2017-10-29 MED FILL — MELOXICAM 15 MG TABLET: 15 | 30 days supply | Qty: 30 | Fill #0

## 2017-10-29 NOTE — Patient Instructions (Signed)

## 2017-11-01 NOTE — Progress Notes (Signed)
Subjective:   Patient ID: Tommy Santiago, male   DOB: 72 y.o.   MRN: [id number]   HPI Patient presents with significant discomfort in the plantar aspect of the heel region bilateral stating that it just started a few weeks ago and he does not remember specific injury and has had previous history of this problem.  Patient does not smoke and likes to be active   Review of Systems  All other systems reviewed and are negative.       Objective:  Physical Exam  Constitutional: He appears well-developed and well-nourished.  Cardiovascular: Intact distal pulses.  Pulmonary/Chest: Effort normal.  Musculoskeletal: Normal range of motion.  Neurological: He is alert.  Skin: Skin is warm.  Nursing note and vitals reviewed.   Neurovascular status intact muscle strength is adequate with range of motion within normal limits with patient found to have exquisite discomfort plantar aspect heel region bilateral with inflammation fluid buildup around the medial band and no other pathology noted.  Patient's found to have good digital perfusion and is well oriented x3     Assessment:  Acute plantar fasciitis bilateral with inflammation fluid around the medial band     Plan:  H&P x-rays reviewed condition discussed and today I injected the plantar fascial bilateral 3 mg Kenalog 5 mg Xylocaine applied fascial brace bilateral gave instructions for physical therapy.  Reappoint for [place] to recheck again in 1 week or earlier if needed  X-ray indicates there is spur formation with moderate arthritis and no indications of stress fracture

## 2017-11-05 ENCOUNTER — Encounter: Payer: Self-pay | Admitting: Podiatry

## 2017-11-05 ENCOUNTER — Ambulatory Visit: Payer: PPO | Admitting: Podiatry

## 2017-11-05 DIAGNOSIS — M722 Plantar fascial fibromatosis: Secondary | ICD-10-CM

## 2017-11-05 MED ORDER — TRIAMCINOLONE ACETONIDE 10 MG/ML IJ SUSP
10.0000 mg | Freq: Once | INTRAMUSCULAR | Status: AC
  Administered 2017-11-05: 10 mg

## 2017-11-07 NOTE — Progress Notes (Signed)
Subjective:   Patient ID: Tommy Santiago, male   DOB: 72 y.o.   MRN: [id number]   HPI Patient states that heel is improving but there still is an area that is very tender with palpation   ROS      Objective:  Physical Exam  Neurovascular status intact with inflammation of the left heel which continues to improve but there is an area that is quite tender in the medial band     Assessment:  Plantar fasciitis left improved but present     Plan:  Final injection 3 mg Kenalog 5 mg Xylocaine advised on continued physical therapy shoe gear modifications and if symptoms persist patient will let us know

## 2017-11-12 MED FILL — VALSARTAN 160 MG TABLET: 160 | 90 days supply | Qty: 90 | Fill #3

## 2017-11-13 DIAGNOSIS — G4733 Obstructive sleep apnea (adult) (pediatric): Secondary | ICD-10-CM | POA: Diagnosis not present

## 2017-11-19 DIAGNOSIS — Z0000 Encounter for general adult medical examination without abnormal findings: Secondary | ICD-10-CM | POA: Diagnosis not present

## 2017-11-19 DIAGNOSIS — I10 Essential (primary) hypertension: Secondary | ICD-10-CM | POA: Diagnosis not present

## 2017-11-19 DIAGNOSIS — R7303 Prediabetes: Secondary | ICD-10-CM | POA: Diagnosis not present

## 2017-11-23 DIAGNOSIS — G4733 Obstructive sleep apnea (adult) (pediatric): Secondary | ICD-10-CM | POA: Diagnosis not present

## 2017-11-23 DIAGNOSIS — Z0000 Encounter for general adult medical examination without abnormal findings: Secondary | ICD-10-CM | POA: Diagnosis not present

## 2017-11-23 DIAGNOSIS — Z9989 Dependence on other enabling machines and devices: Secondary | ICD-10-CM | POA: Diagnosis not present

## 2017-11-25 MED FILL — TADALAFIL 20 MG TABS: 20 | 10 days supply | Qty: 10 | Fill #0

## 2017-11-25 MED FILL — MELOXICAM 15 MG TABLET: 15 | 30 days supply | Qty: 30 | Fill #1

## 2017-11-30 DIAGNOSIS — Z800 Family history of malignant neoplasm of digestive organs: Secondary | ICD-10-CM | POA: Diagnosis not present

## 2017-11-30 DIAGNOSIS — I8500 Esophageal varices without bleeding: Secondary | ICD-10-CM | POA: Diagnosis not present

## 2017-11-30 DIAGNOSIS — K219 Gastro-esophageal reflux disease without esophagitis: Secondary | ICD-10-CM | POA: Diagnosis not present

## 2017-11-30 DIAGNOSIS — K7469 Other cirrhosis of liver: Secondary | ICD-10-CM | POA: Diagnosis not present

## 2017-11-30 MED FILL — FAMOTIDINE 20 MG TABLET: 20 | 90 days supply | Qty: 180 | Fill #0

## 2017-12-02 ENCOUNTER — Other Ambulatory Visit: Payer: Self-pay | Admitting: Gastroenterology

## 2017-12-02 DIAGNOSIS — K7460 Unspecified cirrhosis of liver: Secondary | ICD-10-CM

## 2017-12-08 ENCOUNTER — Encounter: Payer: Self-pay | Admitting: Adult Health

## 2017-12-10 ENCOUNTER — Ambulatory Visit (INDEPENDENT_AMBULATORY_CARE_PROVIDER_SITE_OTHER): Payer: PPO | Admitting: Adult Health

## 2017-12-10 ENCOUNTER — Encounter: Payer: Self-pay | Admitting: Adult Health

## 2017-12-10 VITALS — BP 131/84 | HR 62 | Ht 71.0 in | Wt 229.0 lb

## 2017-12-10 DIAGNOSIS — G4733 Obstructive sleep apnea (adult) (pediatric): Secondary | ICD-10-CM | POA: Diagnosis not present

## 2017-12-10 DIAGNOSIS — Z9989 Dependence on other enabling machines and devices: Secondary | ICD-10-CM | POA: Diagnosis not present

## 2017-12-10 NOTE — Progress Notes (Signed)
PATIENT: Tommy Santiago DOB: 11-18-1945  REASON FOR VISIT: follow up HISTORY FROM: patient  HISTORY OF PRESENT ILLNESS: Today 12/10/17:  [name] is a 72 year old male with a history of obstructive sleep apnea on CPAP.  He returns today for follow-up.  His CPAP download indicates that he uses machine nightly for compliance of 100%.  He uses machine greater than 4 hours each night.  On average he uses his machine 8 hours and 32 minutes.  His residual AHI is 2.1 on 5 to 10 cm of water with EPR 3.  His leak in the 95th percentile is 22.9 L/min.  He reports that the CPAP is working well for him.  He has noticed the benefit.  His Epworth sleepiness score is 4 and fatigue severity score is 34.  He returns today for evaluation.  HISTORY Tommy Santiago is a 72 y.o. male , seen here as in a referralfrom Dr. Coletta Memos for an evaluation for possible sleep apnea.  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  He has been a Medical illustrator.  He carries a diagnosis of bronchiectasis without complications but at one time had community-acquired pneumonia, coronary atherosclerosis- see below =, close compression fracture of the second lumbar vertebra, GERD, hepatitis C history, history of cataract surgery hypertension hyperlipidemia, lichen planus, nocturia and overweight.  He has never reached morbid obesity.  He was diagnosed as having osteopenia.  He used to smoke, and has at one time and entry in his medical chart for paroxysmal supraventricular tachycardia.  I also reviewed his current list of medications.  There is none of these said I would associate a severe impact on sleep quality of sleep architecture, albuterol as a as needed used nebulizer and could sometimes increase the tendency of tremor or insomnia.  Mr. Tommy Santiago has previous sleep study took place on 28 December 2006 after referral by Dr. Philipp Deputy.  The  patient was at that time complaining of hypersomnia with snoring possible sleep apnea but had endorsed the Epworth score is only 4 out of 24 points.  His AHI was 2.5/h, REM AHI was 2.2, insufficient to justify any kind of intervention mean oxygen saturation was 94.8% on room air, isolated limb jerks were noticed,  Sleep habits:  Patient works for over a decades week and nights and had resumed to involve a normal sleep schedule for the weekdays, using melatonin to help him reestablish his sleep cycle.   He usually watches laid-back news on TV before he initiates his bedtime now in retirement.  And his bedtime is around midnight, and he rarely has difficulties initiating sleep.  Once he is asleep he will stay usually asleep for about 2 to 3 hours, he is then first interrupted by the needs to go to the bathroom.  He may reinitiate sleep for another 2 hours -total bathroom breaks at night are 2-3.  He no longer describes racing thoughts, attributes the resolution of these obcessive thoughts with CBD oil. Anxiety is improved. He rises between 8 and 9 AM. Gets 7 around hours of sleep. Wife reports him snoring- the bedroom is cool, quiet and dark , sleep mask and earplugs. .   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 List of diagnosis :Above.   Social history: The patient is a former smoker, drinks usually 2 beers a day, 6 he drinks up  to 6 cups of coffee a day.  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 married with grown up children( 46 and 70 years old)   REVIEW OF SYSTEMS: Out of a complete 14 system review of symptoms, the patient complains only of the following symptoms, and all other reviewed systems are negative.  See HPI  ALLERGIES: Allergies  Allergen Reactions  . Amlodipine Swelling    Ankle swelling    HOME MEDICATIONS: Outpatient Medications Prior to Visit  Medication Sig Dispense Refill  . albuterol (PROVENTIL)  (2.5 MG/3ML) 0.083% nebulizer solution Take 3 mLs (2.5 mg total) by nebulization every 6 (six) hours as needed for wheezing or shortness of breath. DX: J47.0 360 mL 11  . ALPRAZolam (XANAX) 0.5 MG tablet Take 1 tablet (0.5 mg total) by mouth at bedtime as needed for anxiety. 2 tablet 0  . amLODipine (NORVASC) 10 MG tablet   11  . aspirin EC 81 MG tablet Take 1 tablet (81 mg total) by mouth daily.    Marland Kitchen atorvastatin (LIPITOR) 20 MG tablet Take 1 tablet (20 mg total) by mouth daily. 90 tablet 3  . B Complex Vitamins (VITAMIN B COMPLEX PO) Take by mouth.    . Calcium Carbonate-Vitamin D (CALCIUM 600/VITAMIN D) 600-400 MG-UNIT per tablet Take 1 tablet by mouth 2 (two) times daily.      Marland Kitchen doxycycline (VIBRA-TABS) 100 MG tablet   2  . econazole nitrate 1 % cream Apply topically daily.    . hydrochlorothiazide (HYDRODIURIL) 25 MG tablet Take 25 mg by mouth daily.    . Melatonin 5 MG TABS Take 5 mg by mouth as needed.     Marland Kitchen MISC NATURAL PRODUCTS PO Take 20 mg by mouth 2 (two) times daily. CBD Gummy, uses for anxiety and sleep    . nitroGLYCERIN (NITROSTAT) 0.3 MG SL tablet Place 0.3 mg under the tongue every 5 (five) minutes as needed for chest pain.    Marland Kitchen omeprazole (PRILOSEC) 20 MG capsule Take 20 mg by mouth as needed. Patient uses this medication for acid reflux.    . psyllium (METAMUCIL) 58.6 % powder Take 1 packet by mouth daily. Patient uses this mediation for regularity.    Marland Kitchen Respiratory Therapy Supplies (FLUTTER) DEVI Use as directed 1 each 0  . sodium chloride HYPERTONIC 3 % nebulizer solution Take by nebulization 2 (two) times daily. 750 mL 11  . tadalafil (CIALIS) 20 MG tablet Take 20 mg by mouth daily as needed. Patient uses this medication for ED.    . valsartan (DIOVAN) 160 MG tablet Take 160 mg by mouth daily.       No facility-administered medications prior to visit.     PAST MEDICAL HISTORY: Past Medical History:  Diagnosis Date  . Bronchiectasis ([place])   . CAP (community acquired  pneumonia)   . Coronary atherosclerosis of unspecified type of vessel, native or graft    status post cardiac cath w percutaneous coronary intervention using XIENCE drug-eluting stent to mid left anterior descending 07/23/08; LHC 08/07/11: Mid LAD stent patent, moderate size diagonal branch jailed by stent with 70% ostial stenosis, unchanged from 2010 with excellent flow down the diagonal branch, mid circumflex 30%, proximal and mid RCA 20%, EF 60%.  Medical therapy continued.   Marland Kitchen GERD (gastroesophageal reflux disease)   . Hepatitis C    Treated [facility]  . Hyperlipemia   . Hypertension   . Hypopotassemia   . Lichen planus   . Other and unspecified hyperlipidemia   .  Other specified cardiac dysrhythmias(427.89)   . Paroxysmal supraventricular tachycardia ([place])   . Personal history of other infectious and parasitic disease   . Plantar fascial fibromatosis   . Pneumonia   . Syncope and collapse     PAST SURGICAL HISTORY: Past Surgical History:  Procedure Laterality Date  . CATARACT EXTRACTION    . CHOLECYSTECTOMY    . COLONOSCOPY    . colonscopy     status post  . CORONARY ANGIOPLASTY WITH STENT PLACEMENT    . ESOPHAGOGASTRODUODENOSCOPY     status post  . INSERTION OF MESH N/A 01/30/2017   Procedure: INSERTION OF MESH;  Surgeon: Georganna Skeans, MD;  Location: [facility];  Service: General;  Laterality: N/A;  . KNEE ARTHROSCOPY    . LEFT HEART CATHETERIZATION WITH CORONARY ANGIOGRAM  08/07/2011   Procedure: LEFT HEART CATHETERIZATION WITH CORONARY ANGIOGRAM;  Surgeon: Burnell Blanks, MD;  Location[facility] C[facility] LAB;  Service: Cardiovascular;;  . UMBILICAL HERNIA REPAIR N/A 01/30/2017   Procedure: REPAIR OF UMBILICAL HERNIA;  Surgeon: Georganna Skeans, MD;  Location: [facility];  Service: General;  Laterality: N/A;    FAMILY HISTORY: Family History  Problem Relation Age of Onset  . Heart disease Father   . Coronary artery disease Brother         diagnosed in early 7s  . Rectal cancer Brother   . Coronary artery disease Other        hx family    SOCIAL HISTORY: Social History   Socioeconomic History  . Marital status: Married    Spouse name: Not on file  . Number of children: Not on file  . Years of education: Not on file  . Highest education level: Not on file  Occupational History  . Occupation: Critical care RN  Social Needs  . Financial resource strain: Not on file  . Food insecurity:    Worry: Not on file    Inability: Not on file  . Transportation needs:    Medical: Not on file    Non-medical: Not on file  Tobacco Use  . Smoking status: Former Smoker    Packs/day: 2.00    Years: 25.00    Pack years: 50.00    Last attempt to quit: 1989    Years since quitting: 30.8  . Smokeless tobacco: Never Used  Substance and Sexual Activity  . Alcohol use: Yes    Comment: social  . Drug use: No    Comment: quit 25 years ago  . Sexual activity: Not on file  Lifestyle  . Physical activity:    Days per week: Not on file    Minutes per session: Not on file  . Stress: Not on file  Relationships  . Social connections:    Talks on phone: Not on file    Gets together: Not on file    Attends religious service: Not on file    Active member of club or organization: Not on file    Attends meetings of clubs or organizations: Not on file    Relationship status: Not on file  . Intimate partner violence:    Fear of current or ex partner: Not on file    Emotionally abused: Not on file    Physically abused: Not on file    Forced sexual activity: Not on file  Other Topics Concern  . Not on file  Social History Narrative  . Not on file      PHYSICAL EXAM  Vitals:   12/10/17 0812  BP: 131/84  Pulse: 62  Weight: 229 lb (103.9 kg)  Height: 5\' 11"  (1.803 m)   Body mass index is 31.94 kg/m.  Generalized: Well developed, in no acute distress   Neurological examination  Mentation: Alert oriented to time, place,  history taking. Follows all commands speech and language fluent Cranial nerve II-XII: Pupils were equal round reactive to light. Extraocular movements were full, visual field were full on confrontational test. Facial sensation and strength were normal. Uvula tongue midline. Head turning and shoulder shrug  were normal and symmetric.  Neck circumference 20 inches, Mallampati 3+ Motor: The motor testing reveals 5 over 5 strength of all 4 extremities. Good symmetric motor tone is noted throughout.  Sensory: Sensory testing is intact to soft touch on all 4 extremities. No evidence of extinction is noted.  Coordination: Cerebellar testing reveals good finger-nose-finger and heel-to-shin bilaterally.  Gait and station: Gait is normal.    DIAGNOSTIC DATA (LABS, IMAGING, TESTING) - I reviewed patient records, labs, notes, testing and imaging myself where available.  Lab Results  Component Value Date   WBC 7.8 07/11/2016   HGB 13.1 07/11/2016   HCT 37.5 (L) 07/11/2016   MCV 95.7 07/11/2016   PLT 245 07/11/2016      Component Value Date/Time   NA 134 (L) 01/28/2017 1514   K 3.6 01/28/2017 1514   CL 102 01/28/2017 1514   CO2 22 01/28/2017 1514   GLUCOSE 140 (H) 01/28/2017 1514   BUN 14 01/28/2017 1514   CREATININE 0.94 01/28/2017 1514   CALCIUM 9.0 01/28/2017 1514   PROT 6.7 10/13/2017 0857   ALBUMIN 4.2 10/13/2017 0857   AST 17 10/13/2017 0857   ALT 23 10/13/2017 0857   ALKPHOS 57 10/13/2017 0857   BILITOT 0.5 10/13/2017 0857   GFRNONAA >60 01/28/2017 1514   GFRAA >60 01/28/2017 1514   Lab Results  Component Value Date   CHOL 136 10/13/2017   HDL 28 (L) 10/13/2017   LDLCALC 89 10/13/2017   TRIG 97 10/13/2017   CHOLHDL 4.9 10/13/2017   No results found for: HGBA1C No results found for: VITAMINB12 Lab Results  Component Value Date   TSH 1.440 08/06/2011      ASSESSMENT AND PLAN 72 y.o. year old male  has a past medical history of Bronchiectasis ([place]), CAP (community  acquired pneumonia), Coronary atherosclerosis of unspecified type of vessel, native or graft, GERD (gastroesophageal reflux disease), Hepatitis C, Hyperlipemia, Hypertension, Hypopotassemia, Lichen planus, Other and unspecified hyperlipidemia, Other specified cardiac dysrhythmias(427.89), Paroxysmal supraventricular tachycardia ([place]), Personal history of other infectious and parasitic disease, Plantar fascial fibromatosis, Pneumonia, and Syncope and collapse. here with:  1.  Obstructive sleep apnea on CPAP  The patient CPAP download shows excellent compliance and good treatment of his apnea.  He will continue using CPAP nightly and greater than 4 hours each night.  He is advised that if his symptoms worsen or he develop new symptoms he should let us know.  We will follow-up in 6 months or sooner if needed.   I spent 15 minutes with the patient. 50% of this time was spent reviewing CPAP download   Ward Givens, MSN, NP-C 12/10/2017, 8:12 AM [facility] [address] [place], [place] [address] [phone]

## 2017-12-10 NOTE — Patient Instructions (Signed)
Your Plan:  Continue using CPAP nightly and >4 hours each night If your symptoms worsen or you develop new symptoms please let us know.   Thank you for coming to see us at [facility]. I hope we have been able to provide you high quality care today.  You may receive a patient satisfaction survey over the next few weeks. We would appreciate your feedback and comments so that we may continue to improve ourselves and the health of our patients.  

## 2017-12-13 DIAGNOSIS — G4733 Obstructive sleep apnea (adult) (pediatric): Secondary | ICD-10-CM | POA: Diagnosis not present

## 2017-12-14 MED FILL — ATORVASTATIN CALCIUM 20 MG: 20 | 90 days supply | Qty: 90 | Fill #2

## 2017-12-17 ENCOUNTER — Ambulatory Visit
Admission: RE | Admit: 2017-12-17 | Discharge: 2017-12-17 | Disposition: A | Discharge status: Home / Self Care | Payer: PPO | Source: Ambulatory Visit | Attending: Gastroenterology | Admitting: Gastroenterology

## 2017-12-17 DIAGNOSIS — B182 Chronic viral hepatitis C: Secondary | ICD-10-CM | POA: Diagnosis not present

## 2017-12-17 DIAGNOSIS — K7460 Unspecified cirrhosis of liver: Secondary | ICD-10-CM

## 2017-12-17 DIAGNOSIS — K769 Liver disease, unspecified: Secondary | ICD-10-CM | POA: Diagnosis not present

## 2017-12-17 MED ORDER — GADOBENATE DIMEGLUMINE 529 MG/ML IV SOLN
20.0000 mL | Freq: Once | INTRAVENOUS | Status: AC | PRN
  Administered 2017-12-17: 20 mL via INTRAVENOUS

## 2018-02-01 DIAGNOSIS — G4733 Obstructive sleep apnea (adult) (pediatric): Secondary | ICD-10-CM | POA: Diagnosis not present

## 2018-02-03 DIAGNOSIS — K7469 Other cirrhosis of liver: Secondary | ICD-10-CM | POA: Diagnosis not present

## 2018-02-03 DIAGNOSIS — L290 Pruritus ani: Secondary | ICD-10-CM | POA: Diagnosis not present

## 2018-02-03 DIAGNOSIS — Z800 Family history of malignant neoplasm of digestive organs: Secondary | ICD-10-CM | POA: Diagnosis not present

## 2018-02-04 DIAGNOSIS — G4733 Obstructive sleep apnea (adult) (pediatric): Secondary | ICD-10-CM | POA: Diagnosis not present

## 2018-02-05 DIAGNOSIS — L290 Pruritus ani: Secondary | ICD-10-CM | POA: Diagnosis not present

## 2018-02-11 MED FILL — MELOXICAM 15 MG TABLET: 15 | 30 days supply | Qty: 30 | Fill #2

## 2018-02-11 MED FILL — PERMETHRIN 5% CREAM: 5 | 7 days supply | Qty: 60 | Fill #0

## 2018-02-18 MED FILL — VALSARTAN 160 MG TABS: 160 | 90 days supply | Qty: 90 | Fill #0

## 2018-02-22 MED FILL — HYDROCHLOROTHIAZIDE 25 MG T: 25 | 90 days supply | Qty: 90 | Fill #1

## 2018-02-22 MED FILL — PERMETHRIN 5% CREAM: 5 | 7 days supply | Qty: 60 | Fill #1

## 2018-03-02 MED FILL — ATORVASTATIN CALCIUM 20 MG: 20 | 90 days supply | Qty: 90 | Fill #3

## 2018-03-07 DIAGNOSIS — G4733 Obstructive sleep apnea (adult) (pediatric): Secondary | ICD-10-CM | POA: Diagnosis not present

## 2018-03-16 DIAGNOSIS — L291 Pruritus scroti: Secondary | ICD-10-CM | POA: Diagnosis not present

## 2018-03-16 DIAGNOSIS — J479 Bronchiectasis, uncomplicated: Secondary | ICD-10-CM | POA: Diagnosis not present

## 2018-03-16 MED FILL — hydrOXYzine HCL 10 MG TABS: 10 | 10 days supply | Qty: 30 | Fill #0

## 2018-03-16 MED FILL — CLOTRIMAZOLE-BETAMETHASONE: 1-0.05 | 7 days supply | Qty: 30 | Fill #0

## 2018-04-07 DIAGNOSIS — G4733 Obstructive sleep apnea (adult) (pediatric): Secondary | ICD-10-CM | POA: Diagnosis not present

## 2018-04-19 ENCOUNTER — Other Ambulatory Visit: Payer: PPO

## 2018-04-19 DIAGNOSIS — E782 Mixed hyperlipidemia: Secondary | ICD-10-CM | POA: Diagnosis not present

## 2018-04-19 DIAGNOSIS — I10 Essential (primary) hypertension: Secondary | ICD-10-CM

## 2018-04-19 DIAGNOSIS — I2510 Atherosclerotic heart disease of native coronary artery without angina pectoris: Secondary | ICD-10-CM

## 2018-04-19 LAB — HEPATIC FUNCTION PANEL
ALT: 22 IU/L (ref 0–44)
AST: 17 IU/L (ref 0–40)
Albumin: 4.4 g/dL (ref 3.8–4.8)
Alkaline Phosphatase: 57 IU/L (ref 39–117)
Bilirubin Total: 0.6 mg/dL (ref 0.0–1.2)
Bilirubin, Direct: 0.22 mg/dL (ref 0.00–0.40)
TOTAL PROTEIN: 6.5 g/dL (ref 6.0–8.5)

## 2018-04-19 LAB — LIPID PANEL
CHOL/HDL RATIO: 2.9 ratio (ref 0.0–5.0)
CHOLESTEROL TOTAL: 120 mg/dL (ref 100–199)
HDL: 42 mg/dL (ref >39)
LDL Calculated: 63 mg/dL (ref 0–99)
Triglycerides: 73 mg/dL (ref 0–149)
VLDL CHOLESTEROL CAL: 15 mg/dL (ref 5–40)

## 2018-05-03 DIAGNOSIS — G4733 Obstructive sleep apnea (adult) (pediatric): Secondary | ICD-10-CM | POA: Diagnosis not present

## 2018-05-06 DIAGNOSIS — G4733 Obstructive sleep apnea (adult) (pediatric): Secondary | ICD-10-CM | POA: Diagnosis not present

## 2018-05-07 ENCOUNTER — Telehealth: Payer: Self-pay | Admitting: Pulmonary Disease

## 2018-05-07 NOTE — Telephone Encounter (Signed)
Noted, thanks!

## 2018-05-07 NOTE — Telephone Encounter (Signed)
Spoke with the pt  He is calling to let Dr Lake Bells know that he called on call service 05/06/2018 pm  Dr Deterding called him in doxy for his prod cough and congestion (unsure of sputum color)  They were unable to put in the msg last night so pt was told to call back and give this information today  He states his symptoms started 3 days ago  He has not had any fever, recent travel or sick contacts  He is scheduled with Dr Lake Bells on 05/14/18 and declined sooner appt  I advised to call for sooner appt if not improving soon or seek emergent care if needed  Will forward to Dr Lake Bells as Juluis Rainier

## 2018-05-14 ENCOUNTER — Encounter: Payer: Self-pay | Admitting: Pulmonary Disease

## 2018-05-14 ENCOUNTER — Other Ambulatory Visit: Payer: Self-pay

## 2018-05-14 ENCOUNTER — Ambulatory Visit: Payer: PPO | Admitting: Pulmonary Disease

## 2018-05-14 VITALS — BP 132/78 | HR 63 | Ht 71.0 in | Wt 212.8 lb

## 2018-05-14 DIAGNOSIS — J479 Bronchiectasis, uncomplicated: Secondary | ICD-10-CM | POA: Diagnosis not present

## 2018-05-14 MED ORDER — DOXYCYCLINE HYCLATE 100 MG PO TABS: 100.0000 mg | ORAL_TABLET | Freq: Two times a day (BID) | ORAL | 2 refills | Status: DC

## 2018-05-14 MED FILL — DOXYCYCLINE HYCLATE 100 MG: 100 | 7 days supply | Qty: 14 | Fill #0

## 2018-05-14 MED FILL — VALSARTAN 160 MG TABLET: 160 | 90 days supply | Qty: 90 | Fill #1

## 2018-05-14 NOTE — Patient Instructions (Signed)
Bronchiectasis with acute exacerbation: Take doxycycline 100 mg daily x7 more days, keep the prescription on hand, refills given Call me if shortness of breath worsens or fevers or chills Continue to use albuterol and hypertonic saline as needed for chest congestion Practice good hand hygiene Stay active  We will see you back in 1 year or sooner if needed

## 2018-05-14 NOTE — Progress Notes (Signed)
Subjective:    Patient ID: Tommy Santiago, male    DOB: 10/08/1945, 73 y.o.   MRN: [id number]  Synopsis: First evaluated in 2018 for recurrent pneumonia and bronchitis symptoms. Noted to have bronchiectasis primarily in the right lower lobe. This is felt to be related to either occult chronic aspiration (modified barium swallow negative) or perhaps the result of a prior episode of pneumonia.  HPI Chief Complaint  Patient presents with  . Follow-up    63yr f/u for bronchiectasis. States he has had a productive cough. Increased weakness.    Tommy Santiago was sick last week and he called our answering service and a prescription for doxycycline was called in.  Specifically he says that he had been to a local competition and he developed a cough and some mucus production immediately thereafter.  He denied fever, chills, or body aches.  He says he did not feel like he had flulike symptoms.  However, he has been coughing up brown mucus.  The doxycycline has helped to some degree and he no longer feels short of breath or much chest congestion but he still is producing brown mucus.  He has been active in the yard over the last week without too much difficulty.  Past Medical History:  Diagnosis Date  . Bronchiectasis ([place])   . CAP (community acquired pneumonia)   . Coronary atherosclerosis of unspecified type of vessel, native or graft    status post cardiac cath w percutaneous coronary intervention using XIENCE drug-eluting stent to mid left anterior descending 07/23/08; LHC 08/07/11: Mid LAD stent patent, moderate size diagonal branch jailed by stent with 70% ostial stenosis, unchanged from 2010 with excellent flow down the diagonal branch, mid circumflex 30%, proximal and mid RCA 20%, EF 60%.  Medical therapy continued.   Marland Kitchen GERD (gastroesophageal reflux disease)   . Hepatitis C    Treated [facility]  . Hyperlipemia   . Hypertension   . Hypopotassemia   . Lichen planus   . Other and unspecified hyperlipidemia    . Other specified cardiac dysrhythmias(427.89)   . Paroxysmal supraventricular tachycardia ([place])   . Personal history of other infectious and parasitic disease   . Plantar fascial fibromatosis   . Pneumonia   . Syncope and collapse         Review of Systems  Constitutional: Negative for activity change, appetite change, chills and fever.  HENT: Negative for congestion, ear pain, hearing loss, postnasal drip, rhinorrhea, sinus pressure and sneezing.   Eyes: Negative for redness, itching and visual disturbance.  Respiratory: Positive for cough and wheezing. Negative for chest tightness and shortness of breath.   Cardiovascular: Negative for chest pain, palpitations and leg swelling.       Objective:   Physical Exam Vitals:   05/14/18 1533  BP: 132/78  Pulse: 63  SpO2: 96%  Weight: 212 lb 12.8 oz (96.5 kg)  Height: 5\' 11"  (1.803 m)   RA  Gen: well appearing HENT: OP clear, , neck supple PULM: Crackles R base B, normal percussion CV: RRR, no mgr, trace edema GI: BS+, soft, nontender Derm: no cyanosis or rash Psyche: normal mood and affect     Imaging: March 2018 CT chest images independently reviewed: Normal pulmonary parenchyma in the upper lobes bilaterally, there is cylindrical bronchiectasis and mucous plugging in the bases, mild to moderate in severity. Pulmonary nodule left lower lobe stable compared to 2007. May 2018 CT chest images independently reviewed showing once again bronchiectatic changes with mucus plugging  in the lower lobes bilaterally, some airspace consolidation in the left lower lobe consistent with pneumonia.  Hospital records from May 2018 reviewed were he was hospitalized for pneumonia. He was treated with antibiotics for community-acquired pneumonia with Rocephin and azithromycin.  CBC    Component Value Date/Time   WBC 7.8 07/11/2016 0344   RBC 3.92 (L) 07/11/2016 0344   HGB 13.1 07/11/2016 0344   HCT 37.5 (L) 07/11/2016 0344   PLT 245  07/11/2016 0344   MCV 95.7 07/11/2016 0344   MCH 33.4 07/11/2016 0344   MCHC 34.9 07/11/2016 0344   RDW 12.7 07/11/2016 0344   LYMPHSABS 0.7 07/11/2016 0344   MONOABS 0.9 07/11/2016 0344   EOSABS 0.0 07/11/2016 0344   BASOSABS 0.0 07/11/2016 0344        Assessment & Plan:   Bronchiectasis without complication ([place])  Discussion: Tommy Santiago had an exacerbation of his bronchiectasis which is still ongoing but overall he seems to be improving.  He likely needs a 14-day course of antibiotics.  This is not unreasonable considering his bronchiectasis.  Bronchiectasis with acute exacerbation: Take doxycycline 100 mg daily x7 more days, keep the prescription on hand, refills given Call me if shortness of breath worsens or fevers or chills Continue to use albuterol and hypertonic saline as needed for chest congestion Practice good hand hygiene Stay active  We will see you back in 1 year or sooner if needed   Current Outpatient Medications:  .  albuterol (PROVENTIL) (2.5 MG/3ML) 0.083% nebulizer solution, Take 3 mLs (2.5 mg total) by nebulization every 6 (six) hours as needed for wheezing or shortness of breath. DX: J47.0, Disp: 360 mL, Rfl: 11 .  aspirin EC 81 MG tablet, Take 1 tablet (81 mg total) by mouth daily., Disp: , Rfl:  .  atorvastatin (LIPITOR) 20 MG tablet, Take 1 tablet (20 mg total) by mouth daily., Disp: 90 tablet, Rfl: 3 .  B Complex Vitamins (VITAMIN B COMPLEX PO), Take by mouth., Disp: , Rfl:  .  Calcium Carbonate-Vitamin D (CALCIUM 600/VITAMIN D) 600-400 MG-UNIT per tablet, Take 1 tablet by mouth 2 (two) times daily.  , Disp: , Rfl:  .  doxycycline (VIBRA-TABS) 100 MG tablet, , Disp: , Rfl: 2 .  econazole nitrate 1 % cream, Apply topically daily., Disp: , Rfl:  .  hydrochlorothiazide (HYDRODIURIL) 25 MG tablet, Take 25 mg by mouth daily., Disp: , Rfl:  .  Melatonin 5 MG TABS, Take 5 mg by mouth as needed. , Disp: , Rfl:  .  MISC NATURAL PRODUCTS PO, Take 20 mg by mouth 2  (two) times daily. CBD Gummy, uses for anxiety and sleep, Disp: , Rfl:  .  nitroGLYCERIN (NITROSTAT) 0.3 MG SL tablet, Place 0.3 mg under the tongue every 5 (five) minutes as needed for chest pain., Disp: , Rfl:  .  psyllium (METAMUCIL) 58.6 % powder, Take 1 packet by mouth daily. Patient uses this mediation for regularity., Disp: , Rfl:  .  Respiratory Therapy Supplies (FLUTTER) DEVI, Use as directed, Disp: 1 each, Rfl: 0 .  sodium chloride HYPERTONIC 3 % nebulizer solution, Take by nebulization 2 (two) times daily., Disp: 750 mL, Rfl: 11 .  tadalafil (CIALIS) 20 MG tablet, Take 20 mg by mouth daily as needed. Patient uses this medication for ED., Disp: , Rfl:  .  valsartan (DIOVAN) 160 MG tablet, Take 160 mg by mouth daily.  , Disp: , Rfl:  .  VITAMIN D, CHOLECALCIFEROL, PO, Take 1 tablet by mouth  daily., Disp: , Rfl:  .  VITAMIN E PO, Take 1 tablet by mouth daily., Disp: , Rfl:  .  doxycycline (VIBRA-TABS) 100 MG tablet, Take 1 tablet (100 mg total) by mouth 2 (two) times daily., Disp: 14 tablet, Rfl: 2

## 2018-05-17 ENCOUNTER — Other Ambulatory Visit: Payer: Self-pay | Admitting: Physician Assistant

## 2018-05-17 DIAGNOSIS — I2510 Atherosclerotic heart disease of native coronary artery without angina pectoris: Secondary | ICD-10-CM

## 2018-05-17 DIAGNOSIS — I10 Essential (primary) hypertension: Secondary | ICD-10-CM

## 2018-05-17 DIAGNOSIS — E782 Mixed hyperlipidemia: Secondary | ICD-10-CM

## 2018-05-17 MED FILL — HYDROCHLOROTHIAZIDE 25 MG T: 25 | 90 days supply | Qty: 90 | Fill #0

## 2018-05-18 ENCOUNTER — Other Ambulatory Visit: Payer: Self-pay | Admitting: Pulmonary Disease

## 2018-05-18 MED FILL — ATORVASTATIN 20 MG TABLET: 20 | 90 days supply | Qty: 90 | Fill #0

## 2018-05-18 MED FILL — ALBUTEROL 0.083% INHAL SOLN: (2.5 MG/3ML | 25 days supply | Qty: 300 | Fill #0

## 2018-06-01 MED FILL — DOXYCYCLINE HYCLATE 100 MG: 100 | 7 days supply | Qty: 14 | Fill #1

## 2018-06-06 DIAGNOSIS — G4733 Obstructive sleep apnea (adult) (pediatric): Secondary | ICD-10-CM | POA: Diagnosis not present

## 2018-07-06 DIAGNOSIS — G4733 Obstructive sleep apnea (adult) (pediatric): Secondary | ICD-10-CM | POA: Diagnosis not present

## 2018-07-16 ENCOUNTER — Telehealth: Payer: Self-pay | Admitting: Cardiovascular Disease

## 2018-07-16 NOTE — Telephone Encounter (Signed)
Follow up    Patient is returning call. I was able to change the visit type and time of appt. But I am unable to do the consent. Please call patient.

## 2018-07-16 NOTE — Telephone Encounter (Signed)
° ° °  5/22 :   Left VM for patient to return call. Call is regarding upcoming appointment. Office visit needs to be a virtual visit and also needing consent for virtual visit from patient.

## 2018-07-16 NOTE — Telephone Encounter (Signed)
°  NEW MESSAGE - 5/22 :  Spoke to patient.    VIDEO/Doximity visit on 07/23/2018     Consent sent via MyChart    -   07/16/2018

## 2018-07-18 IMAGING — MR MR ABDOMEN WO/W CM
6 of 17 series · 21 of 48 positions shown · IV contrast (AnAgent)
Comparison: 12/04/2015

CLINICAL DATA: Hepatitis-C.  Monitoring for liver lesions.

EXAM:
MRI ABDOMEN WITHOUT AND WITH CONTRAST
TECHNIQUE: Multiplanar multisequence MR imaging of the abdomen was performed
both before and after the administration of intravenous contrast.
CONTRAST:  10mL EOVIST GADOXETATE DISODIUM 0.25 MOL/L IV SOLN

[Series 3: T2 · coronal · 5.5mm · 1.76mm/px · 2 of 39 slices shown]
[im 1/39]
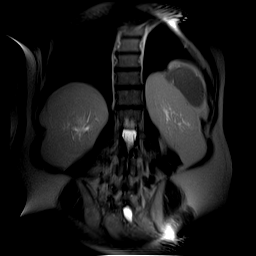
[im 39/39]
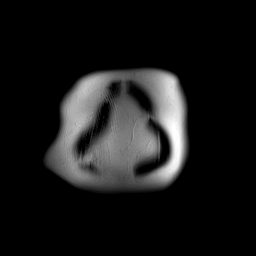

[Series 4: axial in out · axial · 6.0mm · 0.88mm/px · z∈[-136,+68]mm · 4 of 70 slices shown]
[im 1/70]
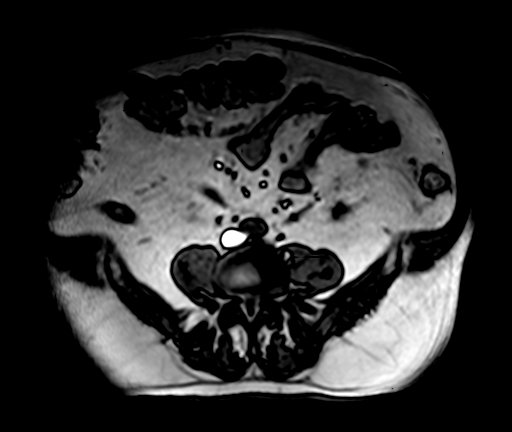
[im 24/70]
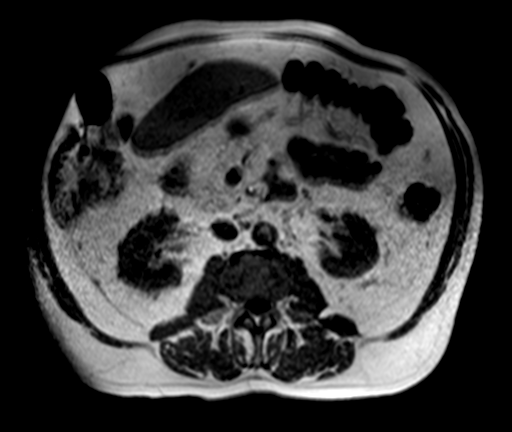
[im 47/70]
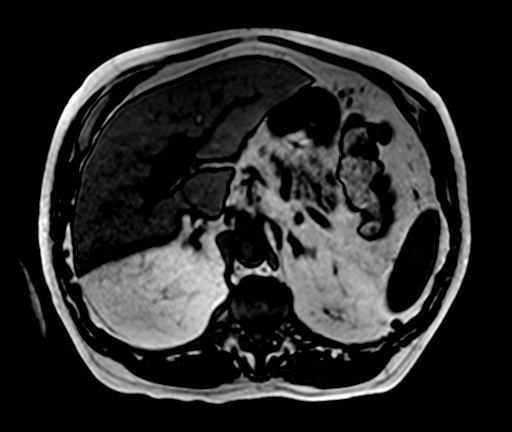
[im 70/70]
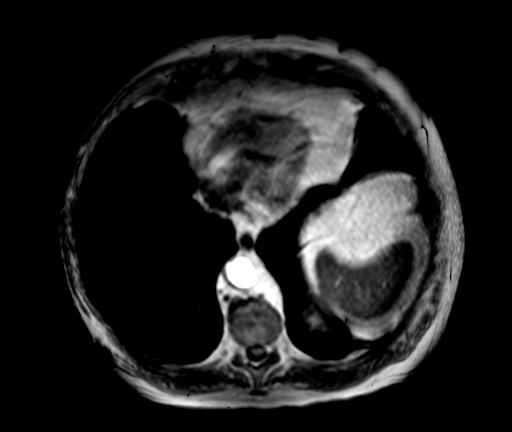

[Series 5: T1 dynamic · axial · non-contrast · 2.2mm · 0.82mm/px · z∈[-139,+70]mm · 4 of 96 slices shown]
[im 1/96]
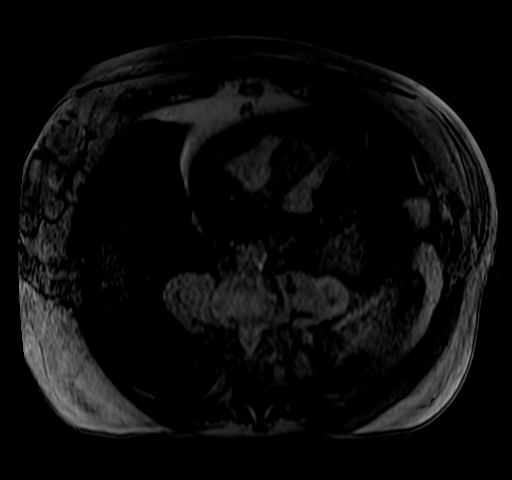
[im 32/96]
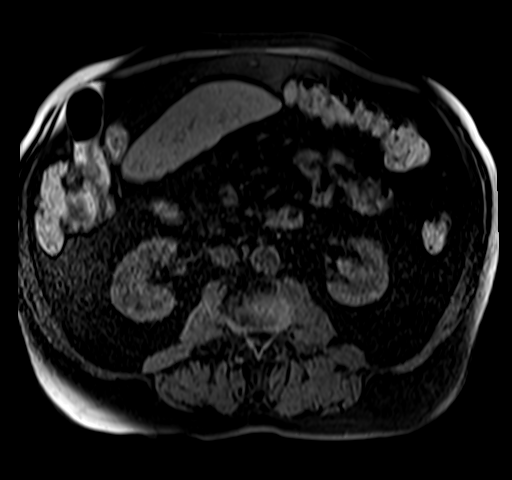
[im 64/96]
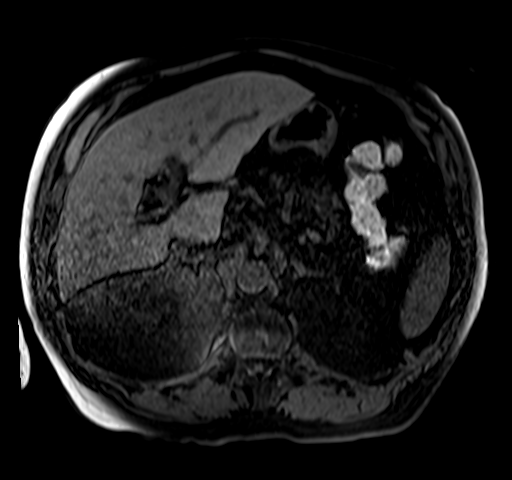
[im 96/96]
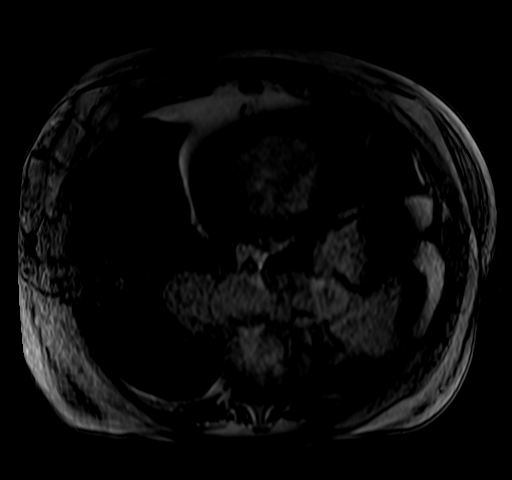

[Series 6: post immed · axial · 2.2mm · 0.82mm/px · z∈[-139,+70]mm · 4 of 96 slices shown]
[im 1/96]
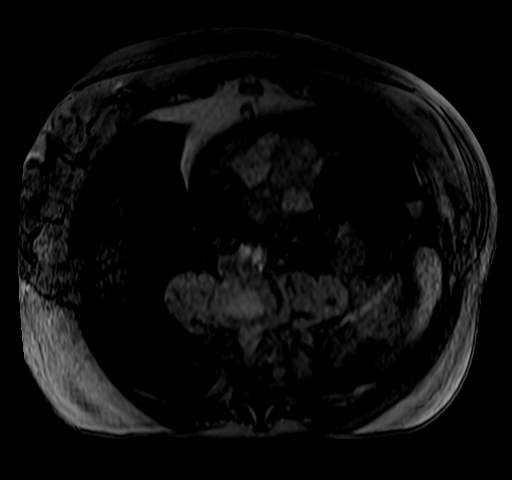
[im 32/96]
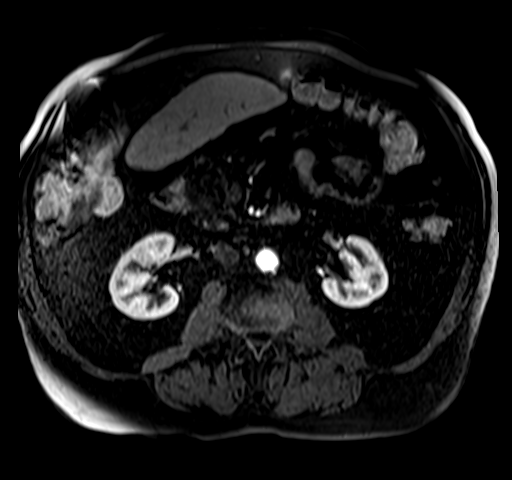
[im 64/96]
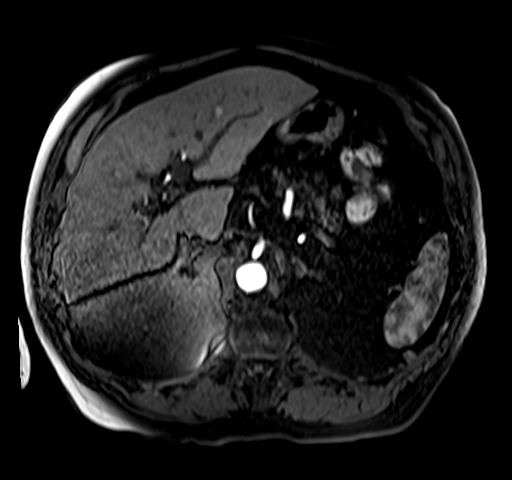
[im 96/96]
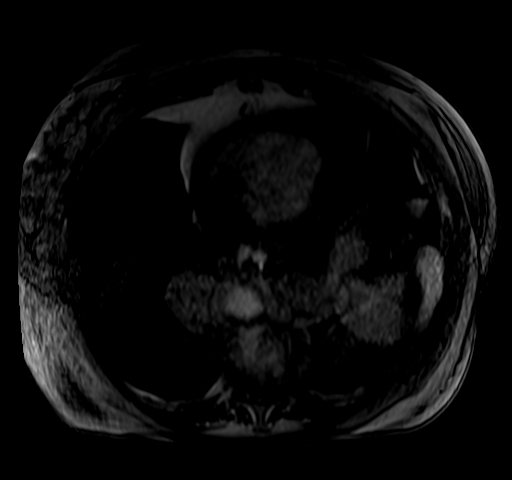

[Series 7: post immed_sub · axial · 2.2mm · 0.82mm/px · z∈[-139,+70]mm · 4 of 96 slices shown]
[im 1/96]
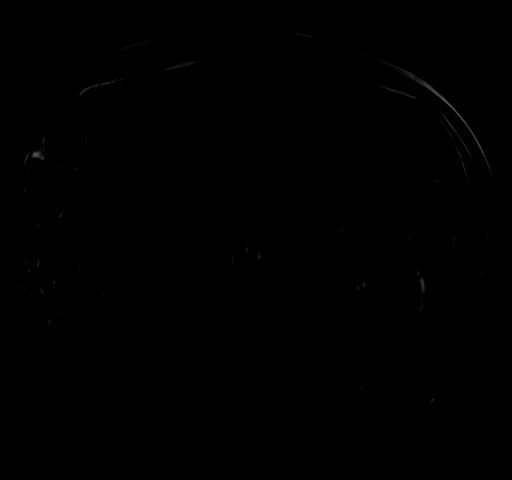
[im 32/96]
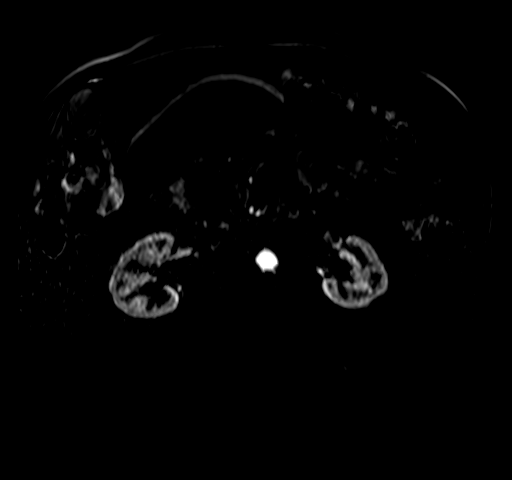
[im 64/96]
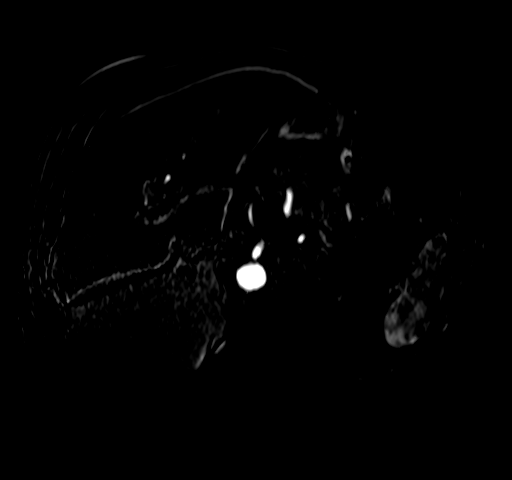
[im 96/96]
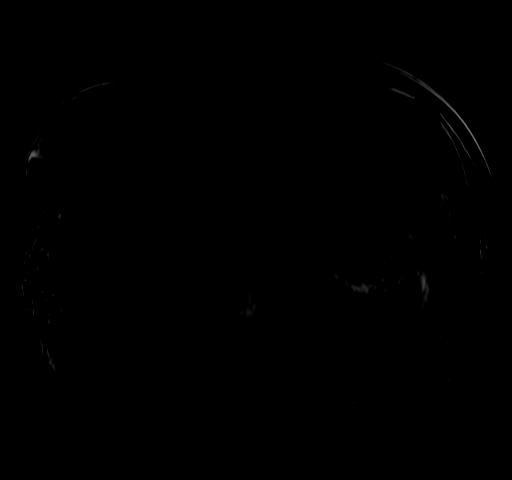

[Series 8: post 45 sec · axial · 2.2mm · 0.82mm/px · z∈[-139,+70]mm · 3 of 96 slices shown]
[im 1/96]
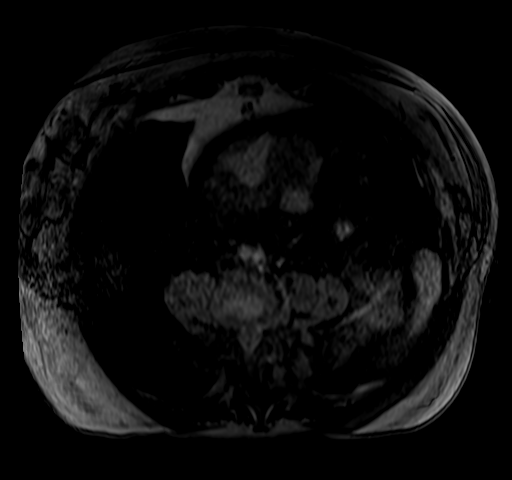
[im 48/96]
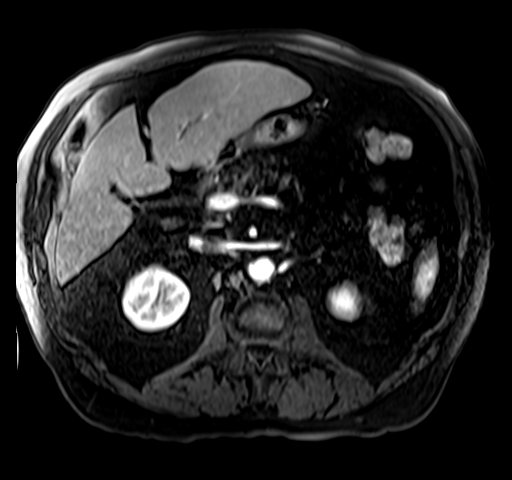
[im 96/96]
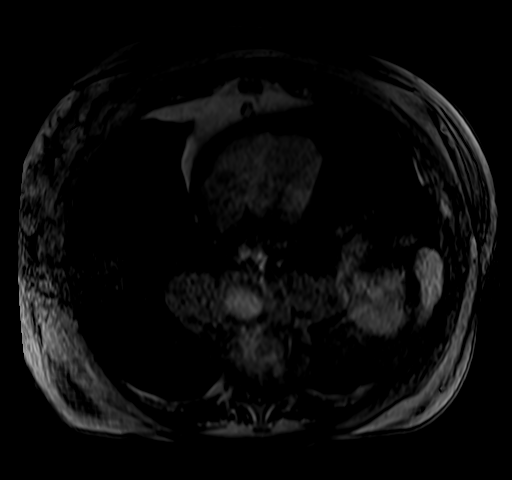

[21 of 48 positions shown; findings below may reference images not displayed]

FINDINGS: Lower chest: Left hemidiaphragm elevation. Normal heart size without
pericardial or pleural effusion.

Hepatobiliary: Caudate and lateral segment left liver lobe
enlargement. Scattered T2 hyperintense liver lesions which are
likely cysts.

As detailed on the prior, within the lateral segment left liver
lobe, is an area of vague arterial phase hyper enhancement on image
33/ series 8. Delayed and hepatobiliary phase hypoenhancement,
including on image 32/ series 10. This may be minimally enlarged,
measuring on the order of 12 mm today versus 9 mm on the prior.
Possible restricted diffusion on series 14 (artifact degraded).

Cholecystectomy, without biliary ductal dilatation.

Pancreas: Fatty replacement throughout the pancreas. No duct
dilatation or dominant mass.

Spleen:  Normal in size, without focal abnormality.

Adrenals/Urinary Tract: Normal adrenal glands. An exophytic lower
pole left renal cyst. Normal right kidney. No hydronephrosis.

Stomach/Bowel: Normal stomach and abdominal bowel loops.

Vascular/Lymphatic: Advanced aortic and branch vessel
atherosclerosis. Patent portal and hepatic veins. No specific
evidence of portal venous hypertension. No retroperitoneal or
retrocrural adenopathy.

Other:  No ascites.

Musculoskeletal: L2 compression deformity, as before.
IMPRESSION: 1. Probable mild cirrhosis.
2. Segment 2-3 liver lesion may have enlarged minimally over 1 year.
This remains indeterminate. Indolent hepatocellular carcinoma cannot
be excluded. Consider follow-up with pre and post contrast abdominal
MRI at 6 months.
3.  Aortic Atherosclerosis ([id number]).

## 2018-07-21 DIAGNOSIS — Z961 Presence of intraocular lens: Secondary | ICD-10-CM | POA: Diagnosis not present

## 2018-07-21 DIAGNOSIS — H0102B Squamous blepharitis left eye, upper and lower eyelids: Secondary | ICD-10-CM | POA: Diagnosis not present

## 2018-07-21 DIAGNOSIS — H0102A Squamous blepharitis right eye, upper and lower eyelids: Secondary | ICD-10-CM | POA: Diagnosis not present

## 2018-07-23 ENCOUNTER — Other Ambulatory Visit: Payer: Self-pay

## 2018-07-23 ENCOUNTER — Telehealth (INDEPENDENT_AMBULATORY_CARE_PROVIDER_SITE_OTHER): Payer: PPO | Admitting: Cardiovascular Disease

## 2018-07-23 ENCOUNTER — Encounter: Payer: Self-pay | Admitting: Cardiovascular Disease

## 2018-07-23 VITALS — BP 136/71 | HR 68 | Temp 97.5°F | Ht 71.0 in | Wt 219.0 lb

## 2018-07-23 DIAGNOSIS — I10 Essential (primary) hypertension: Secondary | ICD-10-CM

## 2018-07-23 DIAGNOSIS — I2510 Atherosclerotic heart disease of native coronary artery without angina pectoris: Secondary | ICD-10-CM | POA: Diagnosis not present

## 2018-07-23 DIAGNOSIS — E782 Mixed hyperlipidemia: Secondary | ICD-10-CM

## 2018-07-23 DIAGNOSIS — Z955 Presence of coronary angioplasty implant and graft: Secondary | ICD-10-CM | POA: Diagnosis not present

## 2018-07-23 DIAGNOSIS — E785 Hyperlipidemia, unspecified: Secondary | ICD-10-CM | POA: Diagnosis not present

## 2018-07-23 MED ORDER — NITROGLYCERIN 0.4 MG SL SUBL: 0.4000 mg | SUBLINGUAL_TABLET | SUBLINGUAL | 6 refills | Status: DC | PRN

## 2018-07-23 MED FILL — NITROGLYCERIN 0.4 MG TAB SL: 0.4 | 13 days supply | Qty: 25 | Fill #0

## 2018-07-23 NOTE — Patient Instructions (Signed)

## 2018-07-23 NOTE — Progress Notes (Signed)
Virtual Visit via Video Note   This visit type was conducted due to national recommendations for restrictions regarding the COVID-19 Pandemic (e.g. social distancing) in an effort to limit this patient's exposure and mitigate transmission in our community.  Due to his co-morbid illnesses, this patient is at least at moderate risk for complications without adequate follow up.  This format is felt to be most appropriate for this patient at this time.  All issues noted in this document were discussed and addressed.  A limited physical exam was performed with this format.  Please refer to the patient's chart for his consent to telehealth for [facility].   Date:  07/23/2018   ID:  Tommy Santiago, DOB 07-27-1945, MRN [id number]  Patient Location: Home Provider Location: Home  PCP:  Bernerd Limbo, MD  Cardiologist:  Lauree Chandler, MD  Electrophysiologist:  None   Evaluation Performed:  Follow-Up Visit  Chief Complaint:  Follow up- CAD  History of Present Illness:    Tommy Santiago is a 73 y.o. male with history of CAD s/p placement Xience DES mid LAD 06/23/08, HTN, hyperlipidemia, former tobacco abuse, bronchiectasis and hepatitis C who is being seen today by virtual e-visit due to the Covid19 pandemic. He has not tolerated beta blockers due to bradycardia. He was seen in June 2013 and had c/o chest pain and was admitted for cath. Cardiac cath with stable disease, patent LAD stent.   The patient denies chest pain, dyspnea, palpitations, dizziness, near syncope or syncope. No lower extremity edema.   The patient does not have symptoms concerning for COVID-19 infection (fever, chills, cough, or new shortness of breath).    Past Medical History:  Diagnosis Date  . Bronchiectasis ([place])   . CAP (community acquired pneumonia)   . Coronary atherosclerosis of unspecified type of vessel, native or graft    status post cardiac cath w percutaneous coronary intervention using XIENCE  drug-eluting stent to mid left anterior descending 07/23/08; LHC 08/07/11: Mid LAD stent patent, moderate size diagonal branch jailed by stent with 70% ostial stenosis, unchanged from 2010 with excellent flow down the diagonal branch, mid circumflex 30%, proximal and mid RCA 20%, EF 60%.  Medical therapy continued.   Marland Kitchen GERD (gastroesophageal reflux disease)   . Hepatitis C    Treated [facility]  . Hyperlipemia   . Hypertension   . Hypopotassemia   . Lichen planus   . Other and unspecified hyperlipidemia   . Other specified cardiac dysrhythmias(427.89)   . Paroxysmal supraventricular tachycardia ([place])   . Personal history of other infectious and parasitic disease   . Plantar fascial fibromatosis   . Pneumonia   . Syncope and collapse    Past Surgical History:  Procedure Laterality Date  . CATARACT EXTRACTION    . CHOLECYSTECTOMY    . COLONOSCOPY    . colonscopy     status post  . CORONARY ANGIOPLASTY WITH STENT PLACEMENT    . ESOPHAGOGASTRODUODENOSCOPY     status post  . INSERTION OF MESH N/A 01/30/2017   Procedure: INSERTION OF MESH;  Surgeon: Georganna Skeans, MD;  Location: [place];  Service: General;  Laterality: N/A;  . KNEE ARTHROSCOPY    . LEFT HEART CATHETERIZATION WITH CORONARY ANGIOGRAM  08/07/2011   Procedure: LEFT HEART CATHETERIZATION WITH CORONARY ANGIOGRAM;  Surgeon: Burnell Blanks, MD;  Location: [facility] CATH LAB;  Service: Cardiovascular;;  . UMBILICAL HERNIA REPAIR N/A 01/30/2017   Procedure: REPAIR OF UMBILICAL HERNIA;  Surgeon: Georganna Skeans, MD;  Location: [place];  Service: General;  Laterality: N/A;     Current Meds  Medication Sig  . albuterol (PROVENTIL) (2.5 MG/3ML) 0.083% nebulizer solution USE 1 VIAL BY NEBULIZATION EVERY 6 HOURS AS NEEDED FOR WHEEZING OR SHORTNESS OF BREATH.  Marland Kitchen aspirin EC 81 MG tablet Take 1 tablet (81 mg total) by mouth daily.  Marland Kitchen atorvastatin (LIPITOR) 20 MG tablet TAKE 1 TABLET (20 MG TOTAL) BY MOUTH  DAILY.  . B Complex Vitamins (VITAMIN B COMPLEX PO) Take by mouth.  . Calcium Carbonate-Vitamin D (CALCIUM 600/VITAMIN D) 600-400 MG-UNIT per tablet Take 1 tablet by mouth 2 (two) times daily.    Marland Kitchen dextromethorphan-guaiFENesin (ROBITUSSIN-DM) 10-100 MG/5ML liquid Take 10 mLs by mouth every 6 (six) hours as needed for cough.  . doxycycline (VIBRAMYCIN) 100 MG capsule Take 100 mg by mouth 2 (two) times daily as needed.  Marland Kitchen econazole nitrate 1 % cream Apply topically daily.  . famotidine (PEPCID) 20 MG tablet Take 20 mg by mouth 2 (two) times daily as needed.  . hydrochlorothiazide (HYDRODIURIL) 25 MG tablet Take 25 mg by mouth daily.  . Melatonin 5 MG TABS Take 5 mg by mouth as needed.   Marland Kitchen MISC NATURAL PRODUCTS PO Take 20 mg by mouth 2 (two) times daily. CBD Gummy, uses for anxiety and sleep  . psyllium (METAMUCIL) 58.6 % powder Take 1 packet by mouth 2 (two) times a day. Patient uses this mediation for regularity.  Marland Kitchen Respiratory Therapy Supplies (FLUTTER) DEVI Use as directed  . sodium chloride HYPERTONIC 3 % nebulizer solution TAKE 1 VIAL BY NEBULIZATION 2 TIMES DAILY.  . tadalafil (CIALIS) 20 MG tablet Take 20 mg by mouth daily as needed. Patient uses this medication for ED.  Marland Kitchen TURMERIC CURCUMIN PO Take 1,000 mg by mouth daily.  . valsartan (DIOVAN) 160 MG tablet Take 160 mg by mouth daily.    Marland Kitchen VITAMIN D, CHOLECALCIFEROL, PO Take 1 tablet by mouth daily.  Marland Kitchen VITAMIN E PO Take 1 tablet by mouth daily.  . [DISCONTINUED] nitroGLYCERIN (NITROSTAT) 0.3 MG SL tablet Place 0.3 mg under the tongue every 5 (five) minutes as needed for chest pain.     Allergies:   Amlodipine   Social History   Tobacco Use  . Smoking status: Former Smoker    Packs/day: 2.00    Years: 25.00    Pack years: 50.00    Last attempt to quit: 1989    Years since quitting: 31.4  . Smokeless tobacco: Never Used  Substance Use Topics  . Alcohol use: Yes    Comment: social  . Drug use: No    Comment: quit 25 years ago      Family Hx: The patient's family history includes Coronary artery disease in his brother and another family member; Heart disease in his father; Rectal cancer in his brother.  ROS:   Please see the history of present illness.    All other systems reviewed and are negative.   Prior CV studies:   The following studies were reviewed today:    Labs/Other Tests and Data Reviewed:    EKG:  No ECG reviewed.  Recent Labs: 04/19/2018: ALT 22   Recent Lipid Panel Lab Results  Component Value Date/Time   CHOL 120 04/19/2018 10:18 AM   TRIG 73 04/19/2018 10:18 AM   HDL 42 04/19/2018 10:18 AM   CHOLHDL 2.9 04/19/2018 10:18 AM   CHOLHDL 4.9 08/07/2011 04:01 AM   LDLCALC  63 04/19/2018 10:18 AM    Wt Readings from Last 3 Encounters:  07/23/18 219 lb (99.3 kg)  05/14/18 212 lb 12.8 oz (96.5 kg)  12/10/17 229 lb (103.9 kg)     Objective:    Vital Signs:  BP 136/71   Pulse 68   Temp (!) 97.5 F (36.4 C)   Ht 5\' 11"  (1.803 m)   Wt 219 lb (99.3 kg)   SpO2 96%   BMI 30.54 kg/m    VITAL SIGNS:  reviewed GEN:  no acute distress  ASSESSMENT & PLAN:    1. CAD without angina: No chest pain. Continue ASA and statin.   2. HTN: BP controlled. No changes  3. Hyperlipidemia: Continue statin.   COVID-19 Education: The signs and symptoms of COVID-19 were discussed with the patient and how to seek care for testing (follow up with PCP or arrange E-visit).  The importance of social distancing was discussed today.  Time:   Today, I have spent 12 minutes with the patient with telehealth technology discussing the above problems.     Medication Adjustments/Labs and Tests Ordered: Current medicines are reviewed at length with the patient today.  Concerns regarding medicines are outlined above.   Tests Ordered: No orders of the defined types were placed in this encounter.   Medication Changes: Meds ordered this encounter  Medications  . nitroGLYCERIN (NITROSTAT) 0.4 MG SL tablet     Sig: Place 1 tablet (0.4 mg total) under the tongue every 5 (five) minutes as needed for chest pain.    Dispense:  25 tablet    Refill:  6    Disposition:  Follow up with me in 6 months  Signed, Lauree Chandler, MD  07/23/2018 2:43 PM    [place]

## 2018-07-29 DIAGNOSIS — G4733 Obstructive sleep apnea (adult) (pediatric): Secondary | ICD-10-CM | POA: Diagnosis not present

## 2018-08-01 ENCOUNTER — Encounter: Payer: Self-pay | Admitting: Adult Health

## 2018-08-02 ENCOUNTER — Telehealth: Payer: Self-pay | Admitting: *Deleted

## 2018-08-02 DIAGNOSIS — K7469 Other cirrhosis of liver: Secondary | ICD-10-CM | POA: Diagnosis not present

## 2018-08-02 NOTE — Telephone Encounter (Signed)
Email sent for [email or web address] to [email or web address].  He consented to doxy.me visit.

## 2018-08-02 NOTE — Telephone Encounter (Signed)
Patient called back and needs to be converted [phone] .  Sandy covering phone room .

## 2018-08-02 NOTE — Telephone Encounter (Signed)
Due to current COVID 19 pandemic, our office is severely reducing in office visits until further notice, in order to minimize the risk to our patients and healthcare providers. Unable to get in contact with the patient to convert their office visit with [facility] on 08/04/2018 into a doxy.me visit. I left a voicemail asking the patient to return my call. Office number was provided.     If patient calls back please convert their office visit into a doxy.me visit.

## 2018-08-02 NOTE — Telephone Encounter (Signed)
I emailed pt link for [email or web address] per his consent.

## 2018-08-04 ENCOUNTER — Other Ambulatory Visit: Payer: Self-pay

## 2018-08-04 ENCOUNTER — Encounter: Payer: Self-pay | Admitting: Adult Health

## 2018-08-04 ENCOUNTER — Ambulatory Visit (INDEPENDENT_AMBULATORY_CARE_PROVIDER_SITE_OTHER): Payer: PPO | Admitting: Adult Health

## 2018-08-04 DIAGNOSIS — G4733 Obstructive sleep apnea (adult) (pediatric): Secondary | ICD-10-CM | POA: Diagnosis not present

## 2018-08-04 DIAGNOSIS — Z9989 Dependence on other enabling machines and devices: Secondary | ICD-10-CM

## 2018-08-04 NOTE — Progress Notes (Signed)
PATIENT: Tommy Santiago DOB: 07/05/1945  REASON FOR VISIT: follow up HISTORY FROM: patient  Virtual Visit via Video Note  I connected with Tommy Santiago on 08/04/18 at  8:30 AM EDT by a video enabled telemedicine application located remotely at [facility] and verified that I am speaking with the correct person using two identifiers who was located at their own home.   I discussed the limitations of evaluation and management by telemedicine and the availability of in person appointments. The patient expressed understanding and agreed to proceed.   PATIENT: Tommy Santiago DOB: [birth year]  REASON FOR VISIT: follow up HISTORY FROM: patient  HISTORY OF PRESENT ILLNESS: Today 08/04/18:  Tommy Santiago is a 73 year old male with a history of obstructive sleep apnea on CPAP.  He returns today for follow-up.  His download indicates that he uses machine nightly for compliance of 100%.  He uses machine greater than 4 hours each night.  On average he uses his machine 8 hours and 21 minutes.  His residual AHI is 0.8 on 5 to 10 cm of water with EPR of 3.  His leak in the 95th percentile is 13.3 L/min.  He reports that the CPAP is working well for him.  Reports that he is getting restful night denies any significant daytime sleepiness.  Occasionally he will take an afternoon nap.  He joins me today for a virtual visit.    REVIEW OF SYSTEMS: Out of a complete 14 system review of symptoms, the patient complains only of the following symptoms, and all other reviewed systems are negative.  See HPI  ALLERGIES: Allergies  Allergen Reactions  . Amlodipine Swelling    Ankle swelling    HOME MEDICATIONS: Outpatient Medications Prior to Visit  Medication Sig Dispense Refill  . albuterol (PROVENTIL) (2.5 MG/3ML) 0.083% nebulizer solution USE 1 VIAL BY NEBULIZATION EVERY 6 HOURS AS NEEDED FOR WHEEZING OR SHORTNESS OF BREATH. 300 mL 10  . aspirin EC 81 MG tablet Take 1 tablet  (81 mg total) by mouth daily.    Marland Kitchen atorvastatin (LIPITOR) 20 MG tablet TAKE 1 TABLET (20 MG TOTAL) BY MOUTH DAILY. 90 tablet 0  . B Complex Vitamins (VITAMIN B COMPLEX PO) Take by mouth.    . Calcium Carbonate-Vitamin D (CALCIUM 600/VITAMIN D) 600-400 MG-UNIT per tablet Take 1 tablet by mouth 2 (two) times daily.      Marland Kitchen dextromethorphan-guaiFENesin (ROBITUSSIN-DM) 10-100 MG/5ML liquid Take 10 mLs by mouth every 6 (six) hours as needed for cough.    . doxycycline (VIBRAMYCIN) 100 MG capsule Take 100 mg by mouth 2 (two) times daily as needed.    Marland Kitchen econazole nitrate 1 % cream Apply topically daily.    . famotidine (PEPCID) 20 MG tablet Take 20 mg by mouth 2 (two) times daily as needed.    . hydrochlorothiazide (HYDRODIURIL) 25 MG tablet Take 25 mg by mouth daily.    . Melatonin 5 MG TABS Take 5 mg by mouth as needed.     Marland Kitchen MISC NATURAL PRODUCTS PO Take 20 mg by mouth 2 (two) times daily. CBD Gummy, uses for anxiety and sleep    . nitroGLYCERIN (NITROSTAT) 0.4 MG SL tablet Place 1 tablet (0.4 mg total) under the tongue every 5 (five) minutes as needed for chest pain. 25 tablet 6  . psyllium (METAMUCIL) 58.6 % powder Take 1 packet by mouth 2 (two) times a day. Patient uses this mediation for regularity.    Marland Kitchen Respiratory  Therapy Supplies (FLUTTER) DEVI Use as directed 1 each 0  . sodium chloride HYPERTONIC 3 % nebulizer solution TAKE 1 VIAL BY NEBULIZATION 2 TIMES DAILY. 750 mL 10  . tadalafil (CIALIS) 20 MG tablet Take 20 mg by mouth daily as needed. Patient uses this medication for ED.    Marland Kitchen TURMERIC CURCUMIN PO Take 1,000 mg by mouth daily.    . valsartan (DIOVAN) 160 MG tablet Take 160 mg by mouth daily.      Marland Kitchen VITAMIN D, CHOLECALCIFEROL, PO Take 1 tablet by mouth daily.    Marland Kitchen VITAMIN E PO Take 1 tablet by mouth daily.     No facility-administered medications prior to visit.     PAST MEDICAL HISTORY: Past Medical History:  Diagnosis Date  . Bronchiectasis ([place])   . CAP (community acquired  pneumonia)   . Coronary atherosclerosis of unspecified type of vessel, native or graft    status post cardiac cath w percutaneous coronary intervention using XIENCE drug-eluting stent to mid left anterior descending 07/23/08; LHC 08/07/11: Mid LAD stent patent, moderate size diagonal branch jailed by stent with 70% ostial stenosis, unchanged from 2010 with excellent flow down the diagonal branch, mid circumflex 30%, proximal and mid RCA 20%, EF 60%.  Medical therapy continued.   Marland Kitchen GERD (gastroesophageal reflux disease)   . Hepatitis C    Treated [place]  . Hyperlipemia   . Hypertension   . Hypopotassemia   . Lichen planus   . Other and unspecified hyperlipidemia   . Other specified cardiac dysrhythmias(427.89)   . Paroxysmal supraventricular tachycardia ([place])   . Personal history of other infectious and parasitic disease   . Plantar fascial fibromatosis   . Pneumonia   . Syncope and collapse     PAST SURGICAL HISTORY: Past Surgical History:  Procedure Laterality Date  . CATARACT EXTRACTION    . CHOLECYSTECTOMY    . COLONOSCOPY    . colonscopy     status post  . CORONARY ANGIOPLASTY WITH STENT PLACEMENT    . ESOPHAGOGASTRODUODENOSCOPY     status post  . INSERTION OF MESH N/A 01/30/2017   Procedure: INSERTION OF MESH;  Surgeon: Georganna Skeans, MD;  Location: [place];  Service: General;  Laterality: N/A;  . KNEE ARTHROSCOPY    . LEFT HEART CATHETERIZATION WITH CORONARY ANGIOGRAM  08/07/2011   Procedure: LEFT HEART CATHETERIZATION WITH CORONARY ANGIOGRAM;  Surgeon: Burnell Blanks, MD;  Location: [facility] C[facility];  Service: Cardiovascular;;  . UMBILICAL HERNIA REPAIR N/A 01/30/2017   Procedure: REPAIR OF UMBILICAL HERNIA;  Surgeon: Georganna Skeans, MD;  Location: [place];  Service: General;  Laterality: N/A;    FAMILY HISTORY: Family History  Problem Relation Age of Onset  . Heart disease Father   . Coronary artery disease Brother         diagnosed in early 33s  . Rectal cancer Brother   . Coronary artery disease Other        hx family    SOCIAL HISTORY: Social History   Socioeconomic History  . Marital status: Married    Spouse name: Not on file  . Number of children: Not on file  . Years of education: Not on file  . Highest education level: Not on file  Occupational History  . Occupation: Critical care RN  Social Needs  . Financial resource strain: Not on file  . Food insecurity:    Worry: Not on file    Inability: Not on file  .  Transportation needs:    Medical: Not on file    Non-medical: Not on file  Tobacco Use  . Smoking status: Former Smoker    Packs/day: 2.00    Years: 25.00    Pack years: 50.00    Last attempt to quit: 1989    Years since quitting: 31.4  . Smokeless tobacco: Never Used  Substance and Sexual Activity  . Alcohol use: Yes    Comment: social  . Drug use: No    Comment: quit 25 years ago  . Sexual activity: Not on file  Lifestyle  . Physical activity:    Days per week: Not on file    Minutes per session: Not on file  . Stress: Not on file  Relationships  . Social connections:    Talks on phone: Not on file    Gets together: Not on file    Attends religious service: Not on file    Active member of club or organization: Not on file    Attends meetings of clubs or organizations: Not on file    Relationship status: Not on file  . Intimate partner violence:    Fear of current or ex partner: Not on file    Emotionally abused: Not on file    Physically abused: Not on file    Forced sexual activity: Not on file  Other Topics Concern  . Not on file  Social History Narrative  . Not on file      PHYSICAL EXAM Generalized: Well developed, in no acute distress   Neurological examination  Mentation: Alert oriented to time, place, history taking. Follows all commands speech and language fluent Cranial nerve II-XII:Extraocular movements were full. Facial symmetry noted. uvula  tongue midline. Head turning and shoulder shrug  were normal and symmetric. Motor: Good strength throughout subjectively per patient Sensory: Sensory testing is intact to soft touch on all 4 extremities subjectively per patient Coordination: Cerebellar testing reveals good finger-nose-finger .  Reflexes: UTA  DIAGNOSTIC DATA (LABS, IMAGING, TESTING) - I reviewed patient records, labs, notes, testing and imaging myself where available.  Lab Results  Component Value Date   WBC 7.8 07/11/2016   HGB 13.1 07/11/2016   HCT 37.5 (L) 07/11/2016   MCV 95.7 07/11/2016   PLT 245 07/11/2016      Component Value Date/Time   NA 134 (L) 01/28/2017 1514   K 3.6 01/28/2017 1514   CL 102 01/28/2017 1514   CO2 22 01/28/2017 1514   GLUCOSE 140 (H) 01/28/2017 1514   BUN 14 01/28/2017 1514   CREATININE 0.94 01/28/2017 1514   CALCIUM 9.0 01/28/2017 1514   PROT 6.5 04/19/2018 1018   ALBUMIN 4.4 04/19/2018 1018   AST 17 04/19/2018 1018   ALT 22 04/19/2018 1018   ALKPHOS 57 04/19/2018 1018   BILITOT 0.6 04/19/2018 1018   GFRNONAA >60 01/28/2017 1514   GFRAA >60 01/28/2017 1514   Lab Results  Component Value Date   CHOL 120 04/19/2018   HDL 42 04/19/2018   LDLCALC 63 04/19/2018   TRIG 73 04/19/2018   CHOLHDL 2.9 04/19/2018   No results found for: HGBA1C No results found for: VITAMINB12 Lab Results  Component Value Date   TSH 1.440 08/06/2011      ASSESSMENT AND PLAN 73 y.o. year old male  has a past medical history of Bronchiectasis ([place]), CAP (community acquired pneumonia), Coronary atherosclerosis of unspecified type of vessel, native or graft, GERD (gastroesophageal reflux disease), Hepatitis C, Hyperlipemia, Hypertension, Hypopotassemia, Lichen  planus, Other and unspecified hyperlipidemia, Other specified cardiac dysrhythmias(427.89), Paroxysmal supraventricular tachycardia ([place]), Personal history of other infectious and parasitic disease, Plantar fascial fibromatosis, Pneumonia, and  Syncope and collapse. here with:  1.  Obstructive sleep apnea on CPAP  The patient CPAP download shows excellent compliance and good treatment of his apnea.  He is encouraged to continue using CPAP nightly and greater than 4 hours each night.  He is advised that if his symptoms worsen or he develops new symptoms he should let us know.  He will follow-up in 6 months or sooner if needed.  I spent 15 minutes with the patient this time was spent reviewing his CPAP download   Ward Givens, MSN, NP-C 08/04/2018, 8:36 AM [facility] [address] [place], [place] [address] [phone]

## 2018-08-06 DIAGNOSIS — G4733 Obstructive sleep apnea (adult) (pediatric): Secondary | ICD-10-CM | POA: Diagnosis not present

## 2018-08-09 DIAGNOSIS — D7589 Other specified diseases of blood and blood-forming organs: Secondary | ICD-10-CM | POA: Diagnosis not present

## 2018-08-24 ENCOUNTER — Other Ambulatory Visit: Payer: Self-pay | Admitting: Physician Assistant

## 2018-08-24 DIAGNOSIS — I10 Essential (primary) hypertension: Secondary | ICD-10-CM

## 2018-08-24 DIAGNOSIS — I2510 Atherosclerotic heart disease of native coronary artery without angina pectoris: Secondary | ICD-10-CM

## 2018-08-24 DIAGNOSIS — E782 Mixed hyperlipidemia: Secondary | ICD-10-CM

## 2018-08-24 MED FILL — ATORVASTATIN 20 MG TABLET: 20 | 90 days supply | Qty: 90 | Fill #0

## 2018-08-24 MED FILL — HYDROCHLOROTHIAZIDE 25 MG T: 25 | 90 days supply | Qty: 90 | Fill #1

## 2018-08-27 MED FILL — VALSARTAN 160 MG TABLET: 160 | 4 days supply | Qty: 4 | Fill #2

## 2018-08-30 MED FILL — OLMESARTAN MEDOXOMIL 20 MG: 20 | 90 days supply | Qty: 90 | Fill #0

## 2018-09-01 DIAGNOSIS — G4733 Obstructive sleep apnea (adult) (pediatric): Secondary | ICD-10-CM | POA: Diagnosis not present

## 2018-09-05 DIAGNOSIS — G4733 Obstructive sleep apnea (adult) (pediatric): Secondary | ICD-10-CM | POA: Diagnosis not present

## 2018-10-06 DIAGNOSIS — G4733 Obstructive sleep apnea (adult) (pediatric): Secondary | ICD-10-CM | POA: Diagnosis not present

## 2018-10-13 DIAGNOSIS — D7589 Other specified diseases of blood and blood-forming organs: Secondary | ICD-10-CM | POA: Diagnosis not present

## 2018-10-18 DIAGNOSIS — H0102B Squamous blepharitis left eye, upper and lower eyelids: Secondary | ICD-10-CM | POA: Diagnosis not present

## 2018-10-18 DIAGNOSIS — H43811 Vitreous degeneration, right eye: Secondary | ICD-10-CM | POA: Diagnosis not present

## 2018-10-18 DIAGNOSIS — H0102A Squamous blepharitis right eye, upper and lower eyelids: Secondary | ICD-10-CM | POA: Diagnosis not present

## 2018-10-18 DIAGNOSIS — Z961 Presence of intraocular lens: Secondary | ICD-10-CM | POA: Diagnosis not present

## 2018-10-26 DIAGNOSIS — G4733 Obstructive sleep apnea (adult) (pediatric): Secondary | ICD-10-CM | POA: Diagnosis not present

## 2018-10-28 DIAGNOSIS — D229 Melanocytic nevi, unspecified: Secondary | ICD-10-CM | POA: Diagnosis not present

## 2018-10-28 DIAGNOSIS — D485 Neoplasm of uncertain behavior of skin: Secondary | ICD-10-CM | POA: Diagnosis not present

## 2018-10-28 DIAGNOSIS — L821 Other seborrheic keratosis: Secondary | ICD-10-CM | POA: Diagnosis not present

## 2018-10-28 DIAGNOSIS — L570 Actinic keratosis: Secondary | ICD-10-CM | POA: Diagnosis not present

## 2018-10-28 DIAGNOSIS — L819 Disorder of pigmentation, unspecified: Secondary | ICD-10-CM | POA: Diagnosis not present

## 2018-10-28 DIAGNOSIS — D2371 Other benign neoplasm of skin of right lower limb, including hip: Secondary | ICD-10-CM | POA: Diagnosis not present

## 2018-10-28 DIAGNOSIS — L814 Other melanin hyperpigmentation: Secondary | ICD-10-CM | POA: Diagnosis not present

## 2018-10-28 DIAGNOSIS — D1801 Hemangioma of skin and subcutaneous tissue: Secondary | ICD-10-CM | POA: Diagnosis not present

## 2018-11-06 DIAGNOSIS — G4733 Obstructive sleep apnea (adult) (pediatric): Secondary | ICD-10-CM | POA: Diagnosis not present

## 2018-11-18 DIAGNOSIS — H43811 Vitreous degeneration, right eye: Secondary | ICD-10-CM | POA: Diagnosis not present

## 2018-11-23 DIAGNOSIS — K7469 Other cirrhosis of liver: Secondary | ICD-10-CM | POA: Diagnosis not present

## 2018-11-26 ENCOUNTER — Other Ambulatory Visit: Payer: Self-pay | Admitting: Physician Assistant

## 2018-11-26 DIAGNOSIS — E782 Mixed hyperlipidemia: Secondary | ICD-10-CM

## 2018-11-26 DIAGNOSIS — I10 Essential (primary) hypertension: Secondary | ICD-10-CM

## 2018-11-26 DIAGNOSIS — I2510 Atherosclerotic heart disease of native coronary artery without angina pectoris: Secondary | ICD-10-CM

## 2018-11-26 MED FILL — OLMESARTAN MEDOXOMIL 20 MG: 20 | 90 days supply | Qty: 90 | Fill #1

## 2018-11-26 MED FILL — ATORVASTATIN 20 MG TABLET: 20 | 90 days supply | Qty: 90 | Fill #0

## 2018-11-29 MED FILL — HYDROCHLOROTHIAZIDE 25 MG T: 25 | 90 days supply | Qty: 90 | Fill #0

## 2018-12-03 DIAGNOSIS — L291 Pruritus scroti: Secondary | ICD-10-CM | POA: Diagnosis not present

## 2018-12-03 DIAGNOSIS — K7469 Other cirrhosis of liver: Secondary | ICD-10-CM | POA: Diagnosis not present

## 2018-12-03 MED FILL — FAMOTIDINE 20 MG TABLET: 20 | 90 days supply | Qty: 180 | Fill #0

## 2018-12-03 MED FILL — hydrOXYzine HCL 10 MG TABS: 10 | 30 days supply | Qty: 90 | Fill #0

## 2018-12-09 ENCOUNTER — Other Ambulatory Visit: Payer: Self-pay | Admitting: Internal Medicine

## 2018-12-09 DIAGNOSIS — K7460 Unspecified cirrhosis of liver: Secondary | ICD-10-CM

## 2018-12-17 DIAGNOSIS — Z1159 Encounter for screening for other viral diseases: Secondary | ICD-10-CM | POA: Diagnosis not present

## 2018-12-23 DIAGNOSIS — K2930 Chronic superficial gastritis without bleeding: Secondary | ICD-10-CM | POA: Diagnosis not present

## 2018-12-23 DIAGNOSIS — K2960 Other gastritis without bleeding: Secondary | ICD-10-CM | POA: Diagnosis not present

## 2018-12-23 DIAGNOSIS — Z8719 Personal history of other diseases of the digestive system: Secondary | ICD-10-CM | POA: Diagnosis not present

## 2018-12-23 DIAGNOSIS — K219 Gastro-esophageal reflux disease without esophagitis: Secondary | ICD-10-CM | POA: Diagnosis not present

## 2018-12-23 DIAGNOSIS — K2950 Unspecified chronic gastritis without bleeding: Secondary | ICD-10-CM | POA: Diagnosis not present

## 2018-12-23 MED FILL — CLOTRIMAZOLE-BETAMETHASONE: 1-0.05 | 14 days supply | Qty: 30 | Fill #0

## 2019-01-03 ENCOUNTER — Ambulatory Visit
Admission: RE | Admit: 2019-01-03 | Discharge: 2019-01-03 | Disposition: A | Discharge status: Home / Self Care | Payer: PPO | Source: Ambulatory Visit | Attending: Internal Medicine | Admitting: Internal Medicine

## 2019-01-03 ENCOUNTER — Other Ambulatory Visit: Payer: Self-pay

## 2019-01-03 DIAGNOSIS — K7460 Unspecified cirrhosis of liver: Secondary | ICD-10-CM

## 2019-01-03 DIAGNOSIS — B182 Chronic viral hepatitis C: Secondary | ICD-10-CM | POA: Diagnosis not present

## 2019-01-03 MED ORDER — GADOBENATE DIMEGLUMINE 529 MG/ML IV SOLN
20.0000 mL | Freq: Once | INTRAVENOUS | Status: AC | PRN
  Administered 2019-01-03: 20 mL via INTRAVENOUS

## 2019-01-17 DIAGNOSIS — H43391 Other vitreous opacities, right eye: Secondary | ICD-10-CM | POA: Diagnosis not present

## 2019-01-17 DIAGNOSIS — Z961 Presence of intraocular lens: Secondary | ICD-10-CM | POA: Diagnosis not present

## 2019-01-17 DIAGNOSIS — H0102B Squamous blepharitis left eye, upper and lower eyelids: Secondary | ICD-10-CM | POA: Diagnosis not present

## 2019-01-17 DIAGNOSIS — H0102A Squamous blepharitis right eye, upper and lower eyelids: Secondary | ICD-10-CM | POA: Diagnosis not present

## 2019-01-17 DIAGNOSIS — H43811 Vitreous degeneration, right eye: Secondary | ICD-10-CM | POA: Diagnosis not present

## 2019-01-26 DIAGNOSIS — G4733 Obstructive sleep apnea (adult) (pediatric): Secondary | ICD-10-CM | POA: Diagnosis not present

## 2019-02-21 MED FILL — ATORVASTATIN 20 MG TABLET: 20 | 90 days supply | Qty: 90 | Fill #1

## 2019-02-21 MED FILL — CLOTRIMAZOLE-BETAMETHASONE: 1-0.05 | 14 days supply | Qty: 30 | Fill #1

## 2019-02-21 MED FILL — HYDROCHLOROTHIAZIDE 25 MG T: 25 | 90 days supply | Qty: 90 | Fill #1

## 2019-02-21 MED FILL — FAMOTIDINE 20 MG TABS: 20 | 90 days supply | Qty: 180 | Fill #1

## 2019-02-22 MED FILL — OLMESARTAN MEDOXOMIL 20 MG: 20 | 90 days supply | Qty: 90 | Fill #0

## 2019-03-01 DIAGNOSIS — E785 Hyperlipidemia, unspecified: Secondary | ICD-10-CM | POA: Diagnosis not present

## 2019-03-01 DIAGNOSIS — Z125 Encounter for screening for malignant neoplasm of prostate: Secondary | ICD-10-CM | POA: Diagnosis not present

## 2019-03-01 DIAGNOSIS — Z0000 Encounter for general adult medical examination without abnormal findings: Secondary | ICD-10-CM | POA: Diagnosis not present

## 2019-03-01 DIAGNOSIS — I2510 Atherosclerotic heart disease of native coronary artery without angina pectoris: Secondary | ICD-10-CM | POA: Diagnosis not present

## 2019-03-01 DIAGNOSIS — I10 Essential (primary) hypertension: Secondary | ICD-10-CM | POA: Diagnosis not present

## 2019-03-01 DIAGNOSIS — K219 Gastro-esophageal reflux disease without esophagitis: Secondary | ICD-10-CM | POA: Diagnosis not present

## 2019-03-01 DIAGNOSIS — R7303 Prediabetes: Secondary | ICD-10-CM | POA: Diagnosis not present

## 2019-04-02 ENCOUNTER — Ambulatory Visit: Payer: PPO

## 2019-04-18 ENCOUNTER — Ambulatory Visit: Payer: PPO

## 2019-04-22 ENCOUNTER — Ambulatory Visit: Payer: PPO | Attending: Internal Medicine

## 2019-04-22 DIAGNOSIS — Z23 Encounter for immunization: Secondary | ICD-10-CM

## 2019-04-22 NOTE — Progress Notes (Signed)
   Covid-19 Vaccination Clinic  Name:  Tommy Santiago    MRN: [id number] DOB: Oct 28, 1945  04/22/2019  [name] was observed post Covid-19 immunization for 15 minutes without incidence. He was provided with Vaccine Information Sheet and instruction to access the V-Safe system.   [name] was instructed to call 911 with any severe reactions post vaccine: Marland Kitchen Difficulty breathing  . Swelling of your face and throat  . A fast heartbeat  . A bad rash all over your body  . Dizziness and weakness    Immunizations Administered    Name Date Dose VIS Date Route   Pfizer COVID-19 Vaccine 04/22/2019  2:01 PM 0.3 mL 02/04/2019 Intramuscular   Manufacturer: [place]   Lot: [id number]   [place]: [id number]

## 2019-04-25 DIAGNOSIS — G4733 Obstructive sleep apnea (adult) (pediatric): Secondary | ICD-10-CM | POA: Diagnosis not present

## 2019-05-16 MED FILL — FAMOTIDINE 20 MG TABS: 20 | 90 days supply | Qty: 180 | Fill #2

## 2019-05-16 MED FILL — ATORVASTATIN 20 MG TABLET: 20 | 90 days supply | Qty: 90 | Fill #2

## 2019-05-16 MED FILL — HYDROCHLOROTHIAZIDE 25 MG T: 25 | 90 days supply | Qty: 90 | Fill #0

## 2019-05-16 MED FILL — OLMESARTAN MEDOXOMIL 20 MG: 20 | 90 days supply | Qty: 90 | Fill #1

## 2019-05-18 ENCOUNTER — Ambulatory Visit: Payer: PPO | Attending: Internal Medicine

## 2019-05-18 DIAGNOSIS — Z23 Encounter for immunization: Secondary | ICD-10-CM

## 2019-05-18 NOTE — Progress Notes (Signed)
   Covid-19 Vaccination Clinic  Name:  Tommy Santiago    MRN: [id number] DOB: May 08, 1945  05/18/2019  [name] was observed post Covid-19 immunization for 15 minutes without incident. He was provided with Vaccine Information Sheet and instruction to access the V-Safe system.   [name] was instructed to call 911 with any severe reactions post vaccine: Marland Kitchen Difficulty breathing  . Swelling of face and throat  . A fast heartbeat  . A bad rash all over body  . Dizziness and weakness   Immunizations Administered    Name Date Dose VIS Date Route   Pfizer COVID-19 Vaccine 05/18/2019  1:55 PM 0.3 mL 02/04/2019 Intramuscular   Manufacturer: [place]   Lot: [id number]   [place]: [id number]

## 2019-06-02 DIAGNOSIS — G4733 Obstructive sleep apnea (adult) (pediatric): Secondary | ICD-10-CM | POA: Diagnosis not present

## 2019-06-14 ENCOUNTER — Ambulatory Visit: Payer: PPO | Admitting: Pulmonary Disease

## 2019-06-14 ENCOUNTER — Other Ambulatory Visit: Payer: Self-pay

## 2019-06-14 ENCOUNTER — Encounter: Payer: Self-pay | Admitting: Pulmonary Disease

## 2019-06-14 VITALS — BP 118/74 | HR 89 | Ht 71.0 in | Wt 233.6 lb

## 2019-06-14 DIAGNOSIS — J479 Bronchiectasis, uncomplicated: Secondary | ICD-10-CM | POA: Diagnosis not present

## 2019-06-14 MED ORDER — FLUTTER DEVI: 0 refills | Status: DC

## 2019-06-14 MED ORDER — ALBUTEROL SULFATE (2.5 MG/3ML) 0.083% IN NEBU: 2.5000 mg | INHALATION_SOLUTION | Freq: Four times a day (QID) | RESPIRATORY_TRACT | 10 refills | Status: DC | PRN

## 2019-06-14 MED ORDER — SODIUM CHLORIDE 3 % IN NEBU: INHALATION_SOLUTION | Freq: Two times a day (BID) | RESPIRATORY_TRACT | 10 refills | Status: DC

## 2019-06-14 MED ORDER — DOXYCYCLINE HYCLATE 100 MG PO CAPS: 100.0000 mg | ORAL_CAPSULE | Freq: Two times a day (BID) | ORAL | 1 refills | Status: DC | PRN

## 2019-06-14 MED FILL — SODIUM CHLORIDE 3 % NEBU: 3 | 60 days supply | Qty: 480 | Fill #0

## 2019-06-14 MED FILL — DOXYCYCLINE HYCLATE 100 MG: 100 | 7 days supply | Qty: 14 | Fill #0

## 2019-06-14 MED FILL — ALBUTEROL SUL 2.5 MG/3 ML S: (2.5 MG/3ML | 25 days supply | Qty: 300 | Fill #0

## 2019-06-14 NOTE — Progress Notes (Signed)
[facility]  Chief Complaint  Patient presents with  . Follow-up    Pt is a former pt of Dr. Lake Bells. Pt states he has been doing fine and denies any complaints.    Constitutional:  BP 118/74 (BP Location: Left Arm, Patient Position: Sitting, Cuff Size: Normal)   Pulse 89   Ht 5\' 11"  (1.803 m)   Wt 233 lb 9.6 oz (106 kg)   SpO2 97% Comment: room air  BMI 32.58 kg/m   Past Medical History:  Hep C with cirrhosis, PNA, CAD, GERD, HLD, HTN, Lichen planus, PSVT  Summary:  Tommy Santiago is a 74 y.o. male former smoker with bronchiectasis likely from reflux.  Subjective:  Previously followed by Dr. [place] Bells.  Symptoms stable.  No recent exacerbations.  Has intermittent cough with clear sputum.  Using hypertonic saline twice per week.  Not using albuterol much.  Needs new tubing for nebulizer and new flutter valve.  Mucinex doesn't seem to help.  Keeps up with activity and does yoga daily.  Received COVID vaccine.   Physical Exam:   Appearance - well kempt  ENMT - no sinus tenderness, no nasal discharge, no oral exudate  Respiratory - no wheeze, or rales  CV - regular rate and rhythm, no murmurs  GI - soft, non tender  Lymph - no adenopathy noted in neck  Ext - no edema  Skin - no rashes  Neuro - normal strength, oriented x 3  Psych - normal mood and affect   Assessment/Plan:   Bronchiectasis. - stable at present - mucinex hasn't been effective - refilled doxycycline which he uses as needed - refilled albuterol, hypertonic saline - will order replacement parts for home nebulizer  Obstructive sleep apnea. - followed by Dr. Brett Fairy with [facility]  CAD. - followed by Dr. Angelena Form with [facility] heart care  Hep C with cirrhosis. - followed by Dr. Earlean Shawl with [facility]  A total of  22 minutes spent addressing patient care issues on day of visit.   Follow up:  Patient Instructions  Follow up in 1  year   Signature:  Chesley Mires, MD [place] Pager: [phone] 06/14/2019, 11:05 AM  Flow Sheet    Chest imaging:  HRCT chest 05/07/16 >> atherosclerosis, GGO in LLL, minimal air trapping, b/l lower lobe cylindrical BTX, liver cirrhosis  Sleep tests:  PSG 08/07/17 >> AHI 10.5, SpO2 low 60%  Medications:   Allergies as of 06/14/2019      Reactions   Amlodipine Swelling   Ankle swelling      Medication List       Accurate as of June 14, 2019 11:05 AM. If you have any questions, ask your nurse or doctor.        STOP taking these medications   Diovan 160 MG tablet Generic drug: valsartan Stopped by: Chesley Mires, MD     TAKE these medications   albuterol (2.5 MG/3ML) 0.083% nebulizer solution Commonly known as: PROVENTIL Take 3 mLs (2.5 mg total) by nebulization every 6 (six) hours as needed for wheezing or shortness of breath. What changed: See the new instructions. Changed by: Chesley Mires, MD   aspirin EC 81 MG tablet Take 1 tablet (81 mg total) by mouth daily.   atorvastatin 20 MG tablet Commonly known as: LIPITOR TAKE 1 TABLET BY MOUTH DAILY.   Calcium 600/Vitamin D 600-400 MG-UNIT tablet Generic drug: Calcium Carbonate-Vitamin D Take 1 tablet by mouth 2 (two) times daily.  Cialis 20 MG tablet Generic drug: tadalafil Take 20 mg by mouth daily as needed. Patient uses this medication for ED.   dextromethorphan-guaiFENesin 10-100 MG/5ML liquid Commonly known as: ROBITUSSIN-DM Take 10 mLs by mouth every 6 (six) hours as needed for cough.   doxycycline 100 MG capsule Commonly known as: VIBRAMYCIN Take 1 capsule (100 mg total) by mouth 2 (two) times daily as needed.   econazole nitrate 1 % cream Apply topically daily.   famotidine 20 MG tablet Commonly known as: PEPCID Take 20 mg by mouth 2 (two) times daily as needed.   Flutter Devi Use as directed   hydrochlorothiazide 25 MG tablet Commonly known as: HYDRODIURIL Take 25  mg by mouth daily.   melatonin 5 MG Tabs Take 5 mg by mouth as needed.   MISC NATURAL PRODUCTS PO Take 20 mg by mouth 2 (two) times daily. CBD Gummy, uses for anxiety and sleep   nitroGLYCERIN 0.4 MG SL tablet Commonly known as: NITROSTAT Place 1 tablet (0.4 mg total) under the tongue every 5 (five) minutes as needed for chest pain.   olmesartan 20 MG tablet Commonly known as: BENICAR Take by mouth.   psyllium 58.6 % powder Commonly known as: METAMUCIL Take 1 packet by mouth 2 (two) times a day. Patient uses this mediation for regularity.   sodium chloride HYPERTONIC 3 % nebulizer solution Take by nebulization in the morning and at bedtime. What changed: See the new instructions. Changed by: Chesley Mires, MD   TURMERIC CURCUMIN PO Take 1,000 mg by mouth daily.   VITAMIN B COMPLEX PO Take by mouth.   VITAMIN D (CHOLECALCIFEROL) PO Take 1 tablet by mouth daily.   VITAMIN E PO Take 1 tablet by mouth daily.       Past Surgical History:  He  has a past surgical history that includes Cholecystectomy; Knee arthroscopy; Esophagogastroduodenoscopy; colonscopy; Coronary angioplasty with stent; left heart catheterization with coronary angiogram (08/07/2011); Umbilical hernia repair (N/A, 01/30/2017); Insertion of mesh (N/A, 01/30/2017); Cataract extraction; and Colonoscopy.  Family History:  His family history includes Coronary artery disease in his brother and another family member; Heart disease in his father; Rectal cancer in his brother.  Social History:  He  reports that he quit smoking about 32 years ago. He has a 50.00 pack-year smoking history. He has never used smokeless tobacco. He reports current alcohol use. He reports that he does not use drugs.

## 2019-06-14 NOTE — Patient Instructions (Signed)
Follow up in 1 year.

## 2019-06-22 DIAGNOSIS — G4733 Obstructive sleep apnea (adult) (pediatric): Secondary | ICD-10-CM | POA: Diagnosis not present

## 2019-06-23 DIAGNOSIS — G4733 Obstructive sleep apnea (adult) (pediatric): Secondary | ICD-10-CM | POA: Diagnosis not present

## 2019-07-20 ENCOUNTER — Other Ambulatory Visit: Payer: Self-pay

## 2019-07-20 ENCOUNTER — Ambulatory Visit: Payer: PPO | Admitting: Cardiovascular Disease

## 2019-07-20 ENCOUNTER — Encounter: Payer: Self-pay | Admitting: Cardiovascular Disease

## 2019-07-20 VITALS — BP 118/70 | HR 57 | Ht 71.0 in | Wt 229.0 lb

## 2019-07-20 DIAGNOSIS — E782 Mixed hyperlipidemia: Secondary | ICD-10-CM

## 2019-07-20 DIAGNOSIS — I10 Essential (primary) hypertension: Secondary | ICD-10-CM | POA: Diagnosis not present

## 2019-07-20 DIAGNOSIS — I2510 Atherosclerotic heart disease of native coronary artery without angina pectoris: Secondary | ICD-10-CM

## 2019-07-20 MED ORDER — NITROGLYCERIN 0.4 MG SL SUBL: 0.4000 mg | SUBLINGUAL_TABLET | SUBLINGUAL | 6 refills | Status: DC | PRN

## 2019-07-20 MED FILL — NITROGLYCERIN 0.4 MG TAB SL: 0.4 | 7 days supply | Qty: 25 | Fill #0

## 2019-07-20 NOTE — Patient Instructions (Signed)
Medication Instructions:  No changes *If you need a refill on your cardiac medications before your next appointment, please call your pharmacy*   Lab Work: none If you have labs (blood work) drawn today and your tests are completely normal, you will receive your results only by: Marland Kitchen MyChart Message (if you have MyChart) OR . A paper copy in the mail If you have any lab test that is abnormal or we need to change your treatment, we will call you to review the results.   Testing/Procedures: Your physician has requested that you have an echocardiogram. Echocardiography is a painless test that uses sound waves to create images of your heart. It provides your doctor with information about the size and shape of your heart and how well your heart's chambers and valves are working. This procedure takes approximately one hour. There are no restrictions for this procedure.  Your physician has requested that you have an exercise tolerance test. For further information please visit [email or web address]. Please also follow instruction sheet, as given.  Follow-Up: At [facility], you and your health needs are our priority.  As part of our continuing mission to provide you with exceptional heart care, we have created designated Provider Care Teams.  These Care Teams include your primary Cardiologist (physician) and Advanced Practice Providers (APPs -  Physician Assistants and Nurse Practitioners) who all work together to provide you with the care you need, when you need it.  Your next appointment:   12 month(s)  The format for your next appointment:   Either In Person or Virtual  Provider:   You may see Lauree Chandler, MD or one of the following Advanced Practice Providers on your designated Care Team:    Melina Copa, PA-C  Ermalinda Barrios, PA-C    Other Instructions

## 2019-07-20 NOTE — Addendum Note (Signed)
Addended by: Rodman Key on: 07/20/2019 09:52 AM   Modules accepted: Orders

## 2019-07-20 NOTE — Progress Notes (Signed)
Chief Complaint  Patient presents with  . Follow-up    CAD   History of Present Illness: 74 yo male with history of CAD with  placement of a drug eluting stent in 2010 in the mid LAD, HTN, hyperlipidemia, former tobacco abuse, bronchiectasis and hepatitis C who is being seen today for cardiac follow up. He has not tolerated beta blockers due to bradycardia. Last cardiac cath in 2013 with stable disease, patent LAD stent.  He is here today for follow up. The patient denies any chest pain, dyspnea, palpitations, lower extremity edema, orthopnea, PND, dizziness, near syncope or syncope.   Primary Care Physician: Bernerd Limbo, MD  Past Medical History:  Diagnosis Date  . Bronchiectasis ([place])   . CAP (community acquired pneumonia)   . Coronary atherosclerosis of unspecified type of vessel, native or graft    status post cardiac cath w percutaneous coronary intervention using XIENCE drug-eluting stent to mid left anterior descending 07/23/08; LHC 08/07/11: Mid LAD stent patent, moderate size diagonal branch jailed by stent with 70% ostial stenosis, unchanged from 2010 with excellent flow down the diagonal branch, mid circumflex 30%, proximal and mid RCA 20%, EF 60%.  Medical therapy continued.   Marland Kitchen GERD (gastroesophageal reflux disease)   . Hepatitis C    Treated [facility]  . Hyperlipemia   . Hypertension   . Hypopotassemia   . Lichen planus   . Other and unspecified hyperlipidemia   . Other specified cardiac dysrhythmias(427.89)   . Paroxysmal supraventricular tachycardia ([place])   . Personal history of other infectious and parasitic disease   . Plantar fascial fibromatosis   . Pneumonia   . Syncope and collapse     Past Surgical History:  Procedure Laterality Date  . CATARACT EXTRACTION    . CHOLECYSTECTOMY    . COLONOSCOPY    . colonscopy     status post  . CORONARY ANGIOPLASTY WITH STENT PLACEMENT    . ESOPHAGOGASTRODUODENOSCOPY     status post  . INSERTION OF MESH N/A  01/30/2017   Procedure: INSERTION OF MESH;  Surgeon: Georganna Skeans, MD;  Location: [facility];  Service: General;  Laterality: N/A;  . KNEE ARTHROSCOPY    . LEFT HEART CATHETERIZATION WITH CORONARY ANGIOGRAM  08/07/2011   Procedure: LEFT HEART CATHETERIZATION WITH CORONARY ANGIOGRAM;  Surgeon: Burnell Blanks, MD;  Location: [facility] CATH LAB;  Service: Cardiovascular;;  . UMBILICAL HERNIA REPAIR N/A 01/30/2017   Procedure: REPAIR OF UMBILICAL HERNIA;  Surgeon: Georganna Skeans, MD;  Location: [facility];  Service: General;  Laterality: N/A;    Current Outpatient Medications  Medication Sig Dispense Refill  . albuterol (PROVENTIL) (2.5 MG/3ML) 0.083% nebulizer solution Take 3 mLs (2.5 mg total) by nebulization every 6 (six) hours as needed for wheezing or shortness of breath. 300 mL 10  . aspirin EC 81 MG tablet Take 1 tablet (81 mg total) by mouth daily.    Marland Kitchen atorvastatin (LIPITOR) 20 MG tablet TAKE 1 TABLET BY MOUTH DAILY. 90 tablet 2  . B Complex Vitamins (VITAMIN B COMPLEX PO) Take by mouth.    . Calcium Carbonate-Vitamin D (CALCIUM 600/VITAMIN D) 600-400 MG-UNIT per tablet Take 1 tablet by mouth 2 (two) times daily.      Marland Kitchen dextromethorphan-guaiFENesin (ROBITUSSIN-DM) 10-100 MG/5ML liquid Take 10 mLs by mouth every 6 (six) hours as needed for cough.    . doxycycline (VIBRAMYCIN) 100 MG capsule Take 1 capsule (100 mg total) by mouth 2 (two) times daily as  needed. 14 capsule 1  . econazole nitrate 1 % cream Apply topically daily.    . famotidine (PEPCID) 20 MG tablet Take 20 mg by mouth 2 (two) times daily as needed.    . hydrochlorothiazide (HYDRODIURIL) 25 MG tablet Take 25 mg by mouth daily.    . Melatonin 5 MG TABS Take 5 mg by mouth as needed.     Marland Kitchen MISC NATURAL PRODUCTS PO Take 20 mg by mouth 2 (two) times daily. CBD Gummy, uses for anxiety and sleep    . olmesartan (BENICAR) 20 MG tablet Take by mouth.    . psyllium (METAMUCIL) 58.6 % powder Take 1 packet  by mouth 2 (two) times a day. Patient uses this mediation for regularity.    Marland Kitchen Respiratory Therapy Supplies (FLUTTER) DEVI Use as directed 1 each 0  . sodium chloride HYPERTONIC 3 % nebulizer solution Take by nebulization in the morning and at bedtime. 750 mL 10  . tadalafil (CIALIS) 20 MG tablet Take 20 mg by mouth daily as needed. Patient uses this medication for ED.    Marland Kitchen TURMERIC CURCUMIN PO Take 1,000 mg by mouth daily.    Marland Kitchen VITAMIN D, CHOLECALCIFEROL, PO Take 1 tablet by mouth daily.    Marland Kitchen VITAMIN E PO Take 1 tablet by mouth daily.    . nitroGLYCERIN (NITROSTAT) 0.4 MG SL tablet Place 1 tablet (0.4 mg total) under the tongue every 5 (five) minutes as needed for chest pain. 25 tablet 6   No current facility-administered medications for this visit.    Allergies  Allergen Reactions  . Amlodipine Swelling    Ankle swelling    Social History   Socioeconomic History  . Marital status: Married    Spouse name: Not on file  . Number of children: Not on file  . Years of education: Not on file  . Highest education level: Not on file  Occupational History  . Occupation: Critical care RN  Tobacco Use  . Smoking status: Former Smoker    Packs/day: 2.00    Years: 25.00    Pack years: 50.00    Quit date: 1989    Years since quitting: 32.4  . Smokeless tobacco: Never Used  Substance and Sexual Activity  . Alcohol use: Yes    Comment: social  . Drug use: No    Comment: quit 25 years ago  . Sexual activity: Not on file  Other Topics Concern  . Not on file  Social History Narrative  . Not on file   Social Determinants of Health   Financial Resource Strain:   . Difficulty of Paying Living Expenses:   Food Insecurity:   . Worried About Charity fundraiser in the Last Year:   . Arboriculturist in the Last Year:   Transportation Needs:   . Film/video editor (Medical):   Marland Kitchen Lack of Transportation (Non-Medical):   Physical Activity:   . Days of Exercise per Week:   . Minutes  of Exercise per Session:   Stress:   . Feeling of Stress :   Social Connections:   . Frequency of Communication with Friends and Family:   . Frequency of Social Gatherings with Friends and Family:   . Attends Religious Services:   . Active Member of Clubs or Organizations:   . Attends Archivist Meetings:   Marland Kitchen Marital Status:   Intimate Partner Violence:   . Fear of Current or Ex-Partner:   . Emotionally Abused:   .  Physically Abused:   . Sexually Abused:     Family History  Problem Relation Age of Onset  . Heart disease Father   . Coronary artery disease Brother        diagnosed in early 50s  . Rectal cancer Brother   . Coronary artery disease Other        hx family    Review of Systems:  As stated in the HPI and otherwise negative.   BP 118/70   Pulse (!) 57   Ht 5\' 11"  (1.803 m)   Wt 229 lb (103.9 kg)   SpO2 98%   BMI 31.94 kg/m   Physical Examination: General: Well developed, well nourished, NAD  HEENT: OP clear, mucus membranes moist  SKIN: warm, dry. No rashes. Neuro: No focal deficits  Musculoskeletal: Muscle strength 5/5 all ext  Psychiatric: Mood and affect normal  Neck: No JVD, no carotid bruits, no thyromegaly, no lymphadenopathy.  Lungs:Clear bilaterally, no wheezes, rhonci, crackles Cardiovascular: Regular rate and rhythm. No murmurs, gallops or rubs. Abdomen:Soft. Bowel sounds present. Non-tender.  Extremities: No lower extremity edema. Pulses are 2 + in the bilateral DP/PT.  EKG:  EKG is ordered today. The ekg ordered today demonstrates sinus brady, rate 57 bpm  Recent Labs: No results found for requested labs within last 8760 hours.   Lipid Panel    Component Value Date/Time   CHOL 120 04/19/2018 1018   TRIG 73 04/19/2018 1018   HDL 42 04/19/2018 1018   CHOLHDL 2.9 04/19/2018 1018   CHOLHDL 4.9 08/07/2011 0401   VLDL 16 08/07/2011 0401   LDLCALC 63 04/19/2018 1018     Wt Readings from Last 3 Encounters:  07/20/19 229 lb  (103.9 kg)  06/14/19 233 lb 9.6 oz (106 kg)  07/23/18 219 lb (99.3 kg)      Assessment and Plan:   1. CAD without angina: He is active and has no chest pain. Continue ASA and statin. .will arrange an exercise stress test since he has had no ischemic testing in years. Echo to assess LV systolic function.    2. HTN: BP is well controlled. Continue current therapy  3. Hyperlipidemia: Lipids followed in primary care. Continue statin.   Current medicines are reviewed at length with the patient today.  The patient does not have concerns regarding medicines.  The following changes have been made:  no change  Labs/ tests ordered today include:   Orders Placed This Encounter  Procedures  . EXERCISE TOLERANCE TEST (ETT)  . EKG 12-Lead  . ECHOCARDIOGRAM COMPLETE     Disposition:   FU with mei in 12 months.    Signed, Lauree Chandler, MD 07/20/2019 8:42 AM    [facility] [address], [place], [place]  [address] Phone: [phone]; Fax: [phone]

## 2019-08-08 ENCOUNTER — Ambulatory Visit: Payer: PPO | Admitting: Adult Health

## 2019-08-08 ENCOUNTER — Encounter: Payer: Self-pay | Admitting: Adult Health

## 2019-08-08 VITALS — BP 114/70 | HR 80 | Ht 71.0 in | Wt 228.0 lb

## 2019-08-08 DIAGNOSIS — Z9989 Dependence on other enabling machines and devices: Secondary | ICD-10-CM

## 2019-08-08 DIAGNOSIS — G4733 Obstructive sleep apnea (adult) (pediatric): Secondary | ICD-10-CM | POA: Diagnosis not present

## 2019-08-08 NOTE — Progress Notes (Signed)
PATIENT: Tommy Santiago DOB: 03-Apr-1945  REASON FOR VISIT: follow up HISTORY FROM: patient  HISTORY OF PRESENT ILLNESS: Today 08/08/19   Tommy Santiago is a 74 year old male with a history of obstructive sleep apnea on CPAP.  His download indicates that he uses machine nightly for compliance of 100%.  He uses machine greater than 4 hours each night.  On average he uses his machine 8 hours and 19 minutes.  His residual AHI is 0.7 on 5 to 10 cm of water with EPR 3.  Leak in the 95th percentile is 19.6 L/min.  Reports that the CPAP is working well for him.  He returns today for an evaluation.  HISTORY  08/04/18:  Tommy Santiago is a 74 year old male with a history of obstructive sleep apnea on CPAP.  He returns today for follow-up.  His download indicates that he uses machine nightly for compliance of 100%.  He uses machine greater than 4 hours each night.  On average he uses his machine 8 hours and 21 minutes.  His residual AHI is 0.8 on 5 to 10 cm of water with EPR of 3.  His leak in the 95th percentile is 13.3 L/min.  He reports that the CPAP is working well for him.  Reports that he is getting restful night denies any significant daytime sleepiness.  Occasionally he will take an afternoon nap.  He joins me today for a virtual visit.   REVIEW OF SYSTEMS: Out of a complete 14 system review of symptoms, the patient complains only of the following symptoms, and all other reviewed systems are negative.  FSS 20   ALLERGIES: Allergies  Allergen Reactions  . Amlodipine Swelling    Ankle swelling    HOME MEDICATIONS: Outpatient Medications Prior to Visit  Medication Sig Dispense Refill  . albuterol (PROVENTIL) (2.5 MG/3ML) 0.083% nebulizer solution Take 3 mLs (2.5 mg total) by nebulization every 6 (six) hours as needed for wheezing or shortness of breath. 300 mL 10  . aspirin EC 81 MG tablet Take 1 tablet (81 mg total) by mouth daily.    Marland Kitchen atorvastatin (LIPITOR) 20 MG tablet TAKE 1 TABLET  BY MOUTH DAILY. 90 tablet 2  . B Complex Vitamins (VITAMIN B COMPLEX PO) Take by mouth.    . Calcium Carbonate-Vitamin D (CALCIUM 600/VITAMIN D) 600-400 MG-UNIT per tablet Take 1 tablet by mouth 2 (two) times daily.      Marland Kitchen dextromethorphan-guaiFENesin (ROBITUSSIN-DM) 10-100 MG/5ML liquid Take 10 mLs by mouth every 6 (six) hours as needed for cough.    . doxycycline (VIBRAMYCIN) 100 MG capsule Take 1 capsule (100 mg total) by mouth 2 (two) times daily as needed. 14 capsule 1  . econazole nitrate 1 % cream Apply topically daily.    . famotidine (PEPCID) 20 MG tablet Take 20 mg by mouth 2 (two) times daily as needed.    . hydrochlorothiazide (HYDRODIURIL) 25 MG tablet Take 25 mg by mouth daily.    . Melatonin 5 MG TABS Take 5 mg by mouth as needed.     Marland Kitchen MISC NATURAL PRODUCTS PO Take 20 mg by mouth 2 (two) times daily. CBD Gummy, uses for anxiety and sleep    . nitroGLYCERIN (NITROSTAT) 0.4 MG SL tablet Place 1 tablet (0.4 mg total) under the tongue every 5 (five) minutes as needed for chest pain. 25 tablet 6  . olmesartan (BENICAR) 20 MG tablet Take by mouth.    . psyllium (METAMUCIL) 58.6 % powder Take 1 packet  by mouth 2 (two) times a day. Patient uses this mediation for regularity.    Marland Kitchen Respiratory Therapy Supplies (FLUTTER) DEVI Use as directed 1 each 0  . sodium chloride HYPERTONIC 3 % nebulizer solution Take by nebulization in the morning and at bedtime. 750 mL 10  . tadalafil (CIALIS) 20 MG tablet Take 20 mg by mouth daily as needed. Patient uses this medication for ED.    Marland Kitchen VITAMIN D, CHOLECALCIFEROL, PO Take 1 tablet by mouth daily.    Marland Kitchen VITAMIN E PO Take 1 tablet by mouth daily.    . TURMERIC CURCUMIN PO Take 1,000 mg by mouth daily.     No facility-administered medications prior to visit.    PAST MEDICAL HISTORY: Past Medical History:  Diagnosis Date  . Bronchiectasis ([place])   . CAP (community acquired pneumonia)   . Coronary atherosclerosis of unspecified type of vessel, native  or graft    status post cardiac cath w percutaneous coronary intervention using XIENCE drug-eluting stent to mid left anterior descending 07/23/08; LHC 08/07/11: Mid LAD stent patent, moderate size diagonal branch jailed by stent with 70% ostial stenosis, unchanged from 2010 with excellent flow down the diagonal branch, mid circumflex 30%, proximal and mid RCA 20%, EF 60%.  Medical therapy continued.   Marland Kitchen GERD (gastroesophageal reflux disease)   . Hepatitis C    Treated [facility]  . Hyperlipemia   . Hypertension   . Hypopotassemia   . Lichen planus   . Other and unspecified hyperlipidemia   . Other specified cardiac dysrhythmias(427.89)   . Paroxysmal supraventricular tachycardia ([place])   . Personal history of other infectious and parasitic disease   . Plantar fascial fibromatosis   . Pneumonia   . Syncope and collapse     PAST SURGICAL HISTORY: Past Surgical History:  Procedure Laterality Date  . CATARACT EXTRACTION    . CHOLECYSTECTOMY    . COLONOSCOPY    . colonscopy     status post  . CORONARY ANGIOPLASTY WITH STENT PLACEMENT    . ESOPHAGOGASTRODUODENOSCOPY     status post  . INSERTION OF MESH N/A 01/30/2017   Procedure: INSERTION OF MESH;  Surgeon: Georganna Skeans, MD;  Location: [facility];  Service: General;  Laterality: N/A;  . KNEE ARTHROSCOPY    . LEFT HEART CATHETERIZATION WITH CORONARY ANGIOGRAM  08/07/2011   Procedure: LEFT HEART CATHETERIZATION WITH CORONARY ANGIOGRAM;  Surgeon: Burnell Blanks, MD;  Location: [facility] CATH LAB;  Service: Cardiovascular;;  . UMBILICAL HERNIA REPAIR N/A 01/30/2017   Procedure: REPAIR OF UMBILICAL HERNIA;  Surgeon: Georganna Skeans, MD;  Location: [facility];  Service: General;  Laterality: N/A;    FAMILY HISTORY: Family History  Problem Relation Age of Onset  . Heart disease Father   . Coronary artery disease Brother        diagnosed in early 10s  . Rectal cancer Brother   . Coronary artery disease Other         hx family    SOCIAL HISTORY: Social History   Socioeconomic History  . Marital status: Married    Spouse name: Not on file  . Number of children: Not on file  . Years of education: Not on file  . Highest education level: Not on file  Occupational History  . Occupation: Critical care RN  Tobacco Use  . Smoking status: Former Smoker    Packs/day: 2.00    Years: 25.00    Pack years: 50.00  Quit date: 48    Years since quitting: 32.4  . Smokeless tobacco: Never Used  Vaping Use  . Vaping Use: Never used  Substance and Sexual Activity  . Alcohol use: Yes    Comment: social  . Drug use: No    Comment: quit 25 years ago  . Sexual activity: Not on file  Other Topics Concern  . Not on file  Social History Narrative  . Not on file   Social Determinants of Health   Financial Resource Strain:   . Difficulty of Paying Living Expenses:   Food Insecurity:   . Worried About Charity fundraiser in the Last Year:   . Arboriculturist in the Last Year:   Transportation Needs:   . Film/video editor (Medical):   Marland Kitchen Lack of Transportation (Non-Medical):   Physical Activity:   . Days of Exercise per Week:   . Minutes of Exercise per Session:   Stress:   . Feeling of Stress :   Social Connections:   . Frequency of Communication with Friends and Family:   . Frequency of Social Gatherings with Friends and Family:   . Attends Religious Services:   . Active Member of Clubs or Organizations:   . Attends Archivist Meetings:   Marland Kitchen Marital Status:   Intimate Partner Violence:   . Fear of Current or Ex-Partner:   . Emotionally Abused:   Marland Kitchen Physically Abused:   . Sexually Abused:       PHYSICAL EXAM  Vitals:   08/08/19 1315  BP: 114/70  Pulse: 80  Weight: 228 lb (103.4 kg)  Height: 5\' 11"  (1.803 m)   Body mass index is 31.8 kg/m.  Generalized: Well developed, in no acute distress  Chest: Lungs clear to auscultation bilaterally  Neurological  examination  Mentation: Alert oriented to time, place, history taking. Follows all commands speech and language fluent Cranial nerve II-XII: Extraocular movements were full, visual field were full on confrontational test Head turning and shoulder shrug  were normal and symmetric. Motor: The motor testing reveals 5 over 5 strength of all 4 extremities. Good symmetric motor tone is noted throughout.  Sensory: Sensory testing is intact to soft touch on all 4 extremities. No evidence of extinction is noted.  Gait and station: Gait is normal.    DIAGNOSTIC DATA (LABS, IMAGING, TESTING) - I reviewed patient records, labs, notes, testing and imaging myself where available.  Lab Results  Component Value Date   WBC 7.8 07/11/2016   HGB 13.1 07/11/2016   HCT 37.5 (L) 07/11/2016   MCV 95.7 07/11/2016   PLT 245 07/11/2016      Component Value Date/Time   NA 134 (L) 01/28/2017 1514   K 3.6 01/28/2017 1514   CL 102 01/28/2017 1514   CO2 22 01/28/2017 1514   GLUCOSE 140 (H) 01/28/2017 1514   BUN 14 01/28/2017 1514   CREATININE 0.94 01/28/2017 1514   CALCIUM 9.0 01/28/2017 1514   PROT 6.5 04/19/2018 1018   ALBUMIN 4.4 04/19/2018 1018   AST 17 04/19/2018 1018   ALT 22 04/19/2018 1018   ALKPHOS 57 04/19/2018 1018   BILITOT 0.6 04/19/2018 1018   GFRNONAA >60 01/28/2017 1514   GFRAA >60 01/28/2017 1514   Lab Results  Component Value Date   CHOL 120 04/19/2018   HDL 42 04/19/2018   LDLCALC 63 04/19/2018   TRIG 73 04/19/2018   CHOLHDL 2.9 04/19/2018   No results found for: HGBA1C No  results found for: VITAMINB12 Lab Results  Component Value Date   TSH 1.440 08/06/2011      ASSESSMENT AND PLAN 74 y.o. year old male  has a past medical history of Bronchiectasis ([place]), CAP (community acquired pneumonia), Coronary atherosclerosis of unspecified type of vessel, native or graft, GERD (gastroesophageal reflux disease), Hepatitis C, Hyperlipemia, Hypertension, Hypopotassemia, Lichen planus,  Other and unspecified hyperlipidemia, Other specified cardiac dysrhythmias(427.89), Paroxysmal supraventricular tachycardia ([place]), Personal history of other infectious and parasitic disease, Plantar fascial fibromatosis, Pneumonia, and Syncope and collapse. here with:  1. OSA on CPAP  - CPAP compliance excellent - Good treatment of AHI  - Encourage patient to use CPAP nightly and > 4 hours each night - F/U in 1 year or sooner if needed   I spent 20 minutes of face-to-face and non-face-to-face time with patient.  This included previsit chart review, lab review, study review, order entry, electronic health record documentation, patient education.  Ward Givens, MSN, NP-C 08/08/2019, 1:38 PM [facility] [address] [place], [place] [address] [phone]

## 2019-08-12 ENCOUNTER — Other Ambulatory Visit (HOSPITAL_COMMUNITY)
Admission: RE | Admit: 2019-08-12 | Discharge: 2019-08-12 | Disposition: A | Discharge status: Home / Self Care | Payer: PPO | Source: Ambulatory Visit | Attending: Cardiovascular Disease | Admitting: Cardiovascular Disease

## 2019-08-12 DIAGNOSIS — Z20822 Contact with and (suspected) exposure to covid-19: Secondary | ICD-10-CM | POA: Diagnosis not present

## 2019-08-12 DIAGNOSIS — Z01812 Encounter for preprocedural laboratory examination: Secondary | ICD-10-CM | POA: Insufficient documentation

## 2019-08-13 LAB — SARS CORONAVIRUS 2 (TAT 6-24 HRS): SARS Coronavirus 2: NEGATIVE

## 2019-08-16 ENCOUNTER — Ambulatory Visit (INDEPENDENT_AMBULATORY_CARE_PROVIDER_SITE_OTHER): Payer: PPO

## 2019-08-16 ENCOUNTER — Ambulatory Visit (HOSPITAL_COMMUNITY): Payer: PPO | Attending: Cardiology

## 2019-08-16 ENCOUNTER — Other Ambulatory Visit: Payer: Self-pay

## 2019-08-16 DIAGNOSIS — E782 Mixed hyperlipidemia: Secondary | ICD-10-CM | POA: Diagnosis not present

## 2019-08-16 DIAGNOSIS — I10 Essential (primary) hypertension: Secondary | ICD-10-CM

## 2019-08-16 DIAGNOSIS — I2510 Atherosclerotic heart disease of native coronary artery without angina pectoris: Secondary | ICD-10-CM

## 2019-08-16 LAB — EXERCISE TOLERANCE TEST
Estimated workload: 5.8 METS
Exercise duration (min): 3 min
Exercise duration (sec): 30 s
MPHR: 148 {beats}/min
Peak HR: 123 {beats}/min
Percent HR: 83 %
RPE: 16
Rest HR: 53 {beats}/min

## 2019-08-25 ENCOUNTER — Other Ambulatory Visit: Payer: Self-pay | Admitting: Cardiovascular Disease

## 2019-08-25 ENCOUNTER — Other Ambulatory Visit: Payer: Self-pay | Admitting: Physician Assistant

## 2019-08-25 DIAGNOSIS — E782 Mixed hyperlipidemia: Secondary | ICD-10-CM

## 2019-08-25 DIAGNOSIS — I2510 Atherosclerotic heart disease of native coronary artery without angina pectoris: Secondary | ICD-10-CM

## 2019-08-25 DIAGNOSIS — I10 Essential (primary) hypertension: Secondary | ICD-10-CM

## 2019-08-25 MED FILL — HYDROCHLOROTHIAZIDE 25 MG T: 25 | 90 days supply | Qty: 90 | Fill #1

## 2019-08-25 MED FILL — FAMOTIDINE 20 MG TABS: 20 | 90 days supply | Qty: 180 | Fill #0

## 2019-08-25 MED FILL — ATORVASTATIN 20 MG TABLET: 20 | 90 days supply | Qty: 90 | Fill #0

## 2019-08-25 MED FILL — OLMESARTAN MEDOXOMIL 20 MG: 20 | 90 days supply | Qty: 90 | Fill #0

## 2019-08-31 DIAGNOSIS — G4733 Obstructive sleep apnea (adult) (pediatric): Secondary | ICD-10-CM | POA: Diagnosis not present

## 2019-09-22 ENCOUNTER — Inpatient Hospital Stay (HOSPITAL_BASED_OUTPATIENT_CLINIC_OR_DEPARTMENT_OTHER)
Admission: EM | Admit: 2019-09-22 | Discharge: 2019-09-25 | DRG: 641 | Disposition: A | Discharge status: Home / Self Care | Payer: PPO | Attending: Internal Medicine | Admitting: Internal Medicine

## 2019-09-22 ENCOUNTER — Encounter (HOSPITAL_BASED_OUTPATIENT_CLINIC_OR_DEPARTMENT_OTHER): Payer: Self-pay | Admitting: Emergency Medicine

## 2019-09-22 ENCOUNTER — Other Ambulatory Visit: Payer: Self-pay

## 2019-09-22 ENCOUNTER — Emergency Department (HOSPITAL_BASED_OUTPATIENT_CLINIC_OR_DEPARTMENT_OTHER): Payer: PPO

## 2019-09-22 DIAGNOSIS — Z79899 Other long term (current) drug therapy: Secondary | ICD-10-CM

## 2019-09-22 DIAGNOSIS — E785 Hyperlipidemia, unspecified: Secondary | ICD-10-CM | POA: Diagnosis present

## 2019-09-22 DIAGNOSIS — J9811 Atelectasis: Secondary | ICD-10-CM | POA: Diagnosis not present

## 2019-09-22 DIAGNOSIS — Z20822 Contact with and (suspected) exposure to covid-19: Secondary | ICD-10-CM | POA: Diagnosis present

## 2019-09-22 DIAGNOSIS — K5900 Constipation, unspecified: Secondary | ICD-10-CM | POA: Diagnosis not present

## 2019-09-22 DIAGNOSIS — G4733 Obstructive sleep apnea (adult) (pediatric): Secondary | ICD-10-CM | POA: Diagnosis present

## 2019-09-22 DIAGNOSIS — Z7982 Long term (current) use of aspirin: Secondary | ICD-10-CM

## 2019-09-22 DIAGNOSIS — K7469 Other cirrhosis of liver: Secondary | ICD-10-CM | POA: Diagnosis not present

## 2019-09-22 DIAGNOSIS — E7800 Pure hypercholesterolemia, unspecified: Secondary | ICD-10-CM | POA: Diagnosis present

## 2019-09-22 DIAGNOSIS — E860 Dehydration: Principal | ICD-10-CM | POA: Diagnosis present

## 2019-09-22 DIAGNOSIS — I6529 Occlusion and stenosis of unspecified carotid artery: Secondary | ICD-10-CM | POA: Diagnosis not present

## 2019-09-22 DIAGNOSIS — M25512 Pain in left shoulder: Secondary | ICD-10-CM | POA: Diagnosis not present

## 2019-09-22 DIAGNOSIS — S4992XA Unspecified injury of left shoulder and upper arm, initial encounter: Secondary | ICD-10-CM | POA: Diagnosis not present

## 2019-09-22 DIAGNOSIS — I10 Essential (primary) hypertension: Secondary | ICD-10-CM | POA: Diagnosis not present

## 2019-09-22 DIAGNOSIS — M19012 Primary osteoarthritis, left shoulder: Secondary | ICD-10-CM | POA: Diagnosis not present

## 2019-09-22 DIAGNOSIS — E871 Hypo-osmolality and hyponatremia: Secondary | ICD-10-CM | POA: Diagnosis present

## 2019-09-22 DIAGNOSIS — Z8619 Personal history of other infectious and parasitic diseases: Secondary | ICD-10-CM

## 2019-09-22 DIAGNOSIS — W19XXXA Unspecified fall, initial encounter: Secondary | ICD-10-CM | POA: Diagnosis present

## 2019-09-22 DIAGNOSIS — E669 Obesity, unspecified: Secondary | ICD-10-CM | POA: Diagnosis present

## 2019-09-22 DIAGNOSIS — R55 Syncope and collapse: Secondary | ICD-10-CM | POA: Diagnosis present

## 2019-09-22 DIAGNOSIS — Z8249 Family history of ischemic heart disease and other diseases of the circulatory system: Secondary | ICD-10-CM | POA: Diagnosis not present

## 2019-09-22 DIAGNOSIS — E876 Hypokalemia: Secondary | ICD-10-CM | POA: Diagnosis not present

## 2019-09-22 DIAGNOSIS — Z87891 Personal history of nicotine dependence: Secondary | ICD-10-CM

## 2019-09-22 DIAGNOSIS — I2510 Atherosclerotic heart disease of native coronary artery without angina pectoris: Secondary | ICD-10-CM | POA: Diagnosis not present

## 2019-09-22 DIAGNOSIS — Z6830 Body mass index (BMI) 30.0-30.9, adult: Secondary | ICD-10-CM

## 2019-09-22 DIAGNOSIS — Z888 Allergy status to other drugs, medicaments and biological substances status: Secondary | ICD-10-CM | POA: Diagnosis not present

## 2019-09-22 DIAGNOSIS — E782 Mixed hyperlipidemia: Secondary | ICD-10-CM | POA: Diagnosis not present

## 2019-09-22 DIAGNOSIS — F1910 Other psychoactive substance abuse, uncomplicated: Secondary | ICD-10-CM | POA: Diagnosis present

## 2019-09-22 DIAGNOSIS — K219 Gastro-esophageal reflux disease without esophagitis: Secondary | ICD-10-CM | POA: Diagnosis not present

## 2019-09-22 DIAGNOSIS — Z789 Other specified health status: Secondary | ICD-10-CM

## 2019-09-22 DIAGNOSIS — D72829 Elevated white blood cell count, unspecified: Secondary | ICD-10-CM | POA: Diagnosis present

## 2019-09-22 DIAGNOSIS — Z955 Presence of coronary angioplasty implant and graft: Secondary | ICD-10-CM

## 2019-09-22 DIAGNOSIS — Z8679 Personal history of other diseases of the circulatory system: Secondary | ICD-10-CM

## 2019-09-22 DIAGNOSIS — Z800 Family history of malignant neoplasm of digestive organs: Secondary | ICD-10-CM

## 2019-09-22 DIAGNOSIS — Z7289 Other problems related to lifestyle: Secondary | ICD-10-CM | POA: Diagnosis not present

## 2019-09-22 DIAGNOSIS — E861 Hypovolemia: Secondary | ICD-10-CM | POA: Diagnosis not present

## 2019-09-22 DIAGNOSIS — Z9989 Dependence on other enabling machines and devices: Secondary | ICD-10-CM | POA: Diagnosis not present

## 2019-09-22 DIAGNOSIS — G319 Degenerative disease of nervous system, unspecified: Secondary | ICD-10-CM | POA: Diagnosis not present

## 2019-09-22 DIAGNOSIS — M7989 Other specified soft tissue disorders: Secondary | ICD-10-CM | POA: Diagnosis not present

## 2019-09-22 LAB — CBC
HCT: 41.7 % (ref 39.0–52.0)
Hemoglobin: 14.5 g/dL (ref 13.0–17.0)
MCH: 33.8 pg (ref 26.0–34.0)
MCHC: 34.8 g/dL (ref 30.0–36.0)
MCV: 97.2 fL (ref 80.0–100.0)
Platelets: 261 10*3/uL (ref 150–400)
RBC: 4.29 MIL/uL (ref 4.22–5.81)
RDW: 12.2 % (ref 11.5–15.5)
WBC: 14.8 10*3/uL — ABNORMAL HIGH (ref 4.0–10.5)
nRBC: 0 % (ref 0.0–0.2)

## 2019-09-22 LAB — HEPATIC FUNCTION PANEL
ALT: 24 U/L (ref 0–44)
AST: 23 U/L (ref 15–41)
Albumin: 4.4 g/dL (ref 3.5–5.0)
Alkaline Phosphatase: 53 U/L (ref 38–126)
Bilirubin, Direct: 0.2 mg/dL (ref 0.0–0.2)
Indirect Bilirubin: 0.7 mg/dL (ref 0.3–0.9)
Total Bilirubin: 0.9 mg/dL (ref 0.3–1.2)
Total Protein: 7 g/dL (ref 6.5–8.1)

## 2019-09-22 LAB — BASIC METABOLIC PANEL
Anion gap: 12 (ref 5–15)
BUN: 15 mg/dL (ref 8–23)
CO2: 20 mmol/L — ABNORMAL LOW (ref 22–32)
Calcium: 8.6 mg/dL — ABNORMAL LOW (ref 8.9–10.3)
Chloride: 89 mmol/L — ABNORMAL LOW (ref 98–111)
Creatinine, Ser: 0.87 mg/dL (ref 0.61–1.24)
GFR calc Af Amer: >60 mL/min (ref >60)
GFR calc non Af Amer: >60 mL/min (ref >60)
Glucose, Bld: 133 mg/dL — ABNORMAL HIGH (ref 70–99)
Potassium: 3.4 mmol/L — ABNORMAL LOW (ref 3.5–5.1)
Sodium: 121 mmol/L — ABNORMAL LOW (ref 135–145)

## 2019-09-22 LAB — URINALYSIS, ROUTINE W REFLEX MICROSCOPIC
Bilirubin Urine: NEGATIVE
Glucose, UA: NEGATIVE mg/dL
Hgb urine dipstick: NEGATIVE
Ketones, ur: 15 mg/dL — AB
Leukocytes,Ua: NEGATIVE
Nitrite: NEGATIVE
Protein, ur: NEGATIVE mg/dL
Specific Gravity, Urine: <1.005 — ABNORMAL LOW (ref 1.005–1.030)
pH: 6 (ref 5.0–8.0)

## 2019-09-22 LAB — CBG MONITORING, ED: Glucose-Capillary: 121 mg/dL — ABNORMAL HIGH (ref 70–99)

## 2019-09-22 LAB — NA AND K (SODIUM & POTASSIUM), RAND UR
Potassium Urine: 18 mmol/L
Sodium, Ur: 63 mmol/L

## 2019-09-22 LAB — RAPID URINE DRUG SCREEN, HOSP PERFORMED
Amphetamines: NOT DETECTED
Barbiturates: NOT DETECTED
Benzodiazepines: NOT DETECTED
Cocaine: NOT DETECTED
Opiates: NOT DETECTED
Tetrahydrocannabinol: POSITIVE — AB

## 2019-09-22 LAB — OSMOLALITY, URINE: Osmolality, Ur: 267 mOsm/kg — ABNORMAL LOW (ref 300–900)

## 2019-09-22 LAB — SARS CORONAVIRUS 2 BY RT PCR (HOSPITAL ORDER, PERFORMED IN ~~LOC~~ HOSPITAL LAB): SARS Coronavirus 2: NEGATIVE

## 2019-09-22 LAB — TROPONIN I (HIGH SENSITIVITY): Troponin I (High Sensitivity): 5 ng/L (ref <18)

## 2019-09-22 MED ORDER — ACETAMINOPHEN 500 MG PO TABS
1000.0000 mg | ORAL_TABLET | Freq: Once | ORAL | Status: AC
  Administered 2019-09-22: 1000 mg via ORAL
  Filled 2019-09-22: qty 2

## 2019-09-22 MED ORDER — SODIUM CHLORIDE 0.9 % IV BOLUS
1000.0000 mL | Freq: Once | INTRAVENOUS | Status: AC
  Administered 2019-09-22: 1000 mL via INTRAVENOUS

## 2019-09-22 MED ORDER — ALUM & MAG HYDROXIDE-SIMETH 200-200-20 MG/5ML PO SUSP
15.0000 mL | Freq: Once | ORAL | Status: AC
  Administered 2019-09-22: 15 mL via ORAL
  Filled 2019-09-22: qty 30

## 2019-09-22 NOTE — ED Notes (Signed)
Pt c/o abd pain, MD made aware.

## 2019-09-22 NOTE — ED Provider Notes (Signed)
[facility] EMERGENCY DEPARTMENT Provider Note   CSN: [id number] Arrival date & time: 09/22/19  1816   History Chief Complaint  Patient presents with  . Loss of Consciousness   Tommy Santiago is a 74 y.o. male.  Mr. Tommy Santiago is 74yo male with HTN, HLD, CAD s/p PCI who presents to ED after syncopal episode this afternoon. Patient says he was in the sun during the day. Reports he felt fine when he smoked a delta 8 joint on the porch. He proceeded to go inside a few minutes later, where he felt flushed. Patient says he then passed out in the kitchen. Event was unwitnessed. Patient says he doesn't think he hit his head on the floor and denies HA. Reports L shoulder and L hip pain since the incident. Denies CP, vision changes, SOB, abdominal pain, vomiting. He said he ate two meals during the day and had 6 cups of coffee in the AM. Denies hx of seizures or prior syncopal events.      Past Medical History:  Diagnosis Date  . Bronchiectasis ([place])   . CAP (community acquired pneumonia)   . Coronary atherosclerosis of unspecified type of vessel, native or graft    status post cardiac cath w percutaneous coronary intervention using XIENCE drug-eluting stent to mid left anterior descending 07/23/08; LHC 08/07/11: Mid LAD stent patent, moderate size diagonal branch jailed by stent with 70% ostial stenosis, unchanged from 2010 with excellent flow down the diagonal branch, mid circumflex 30%, proximal and mid RCA 20%, EF 60%.  Medical therapy continued.   Marland Kitchen GERD (gastroesophageal reflux disease)   . Hepatitis C    Treated [place]  . Hyperlipemia   . Hypertension   . Hypopotassemia   . Lichen planus   . Other and unspecified hyperlipidemia   . Other specified cardiac dysrhythmias(427.89)   . Paroxysmal supraventricular tachycardia ([place])   . Personal history of other infectious and parasitic disease   . Plantar fascial fibromatosis   . Pneumonia   . Syncope and collapse    Patient  Active Problem List   Diagnosis Date Noted  . Obesity (BMI 30-39.9) 06/24/2017  . Coronary artery disease involving native coronary artery of native heart 06/22/2017  . Witnessed episode of apnea 06/22/2017  . Nocturia more than twice per night 06/22/2017  . Snoring 06/22/2017  . 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 Hyperglycemia 07/11/2016  . Sepsis ([place]) 07/11/2016  . CAP (community acquired pneumonia) 07/10/2016  . Dysphagia 05/08/2016  . Bronchiectasis ([place]) 04/29/2016  . CAD, NATIVE VESSEL 07/24/2009  . Hyperlipidemia 06/30/2008  . HYPOKALEMIA 06/30/2008  . Essential hypertension 06/30/2008  . PAROXYSMAL SUPRAVENTRICULAR TACHYCARDIA 06/30/2008  . BRADYCARDIA 06/30/2008  . LICHEN PLANUS, MOUTH 06/30/2008  . PLANTAR FASCIITIS 06/30/2008  . VASOVAGAL SYNCOPE 06/30/2008  . HEPATITIS C, HX OF 06/30/2008   Past Surgical History:  Procedure Laterality Date  . CATARACT EXTRACTION    . CHOLECYSTECTOMY    . COLONOSCOPY    . colonscopy     status post  . CORONARY ANGIOPLASTY WITH STENT PLACEMENT    . ESOPHAGOGASTRODUODENOSCOPY     status post  . INSERTION OF MESH N/A 01/30/2017   Procedure: INSERTION OF MESH;  Surgeon: Georganna Skeans, MD;  Location: [facility];  Service: General;  Laterality: N/A;  . KNEE ARTHROSCOPY    . LEFT HEART CATHETERIZATION WITH CORONARY ANGIOGRAM  08/07/2011   Procedure: LEFT HEART CATHETERIZATION WITH CORONARY ANGIOGRAM;  Surgeon: Burnell Blanks,  MD;  Location: [facility] CATH LAB;  Service: Cardiovascular;;  . UMBILICAL HERNIA REPAIR N/A 01/30/2017   Procedure: REPAIR OF UMBILICAL HERNIA;  Surgeon: Georganna Skeans, MD;  Location: [facility];  Service: General;  Laterality: N/A;     Family History  Problem Relation Age of Onset  . Heart disease Father   . Coronary artery disease Brother        diagnosed in early 43s  . Rectal cancer Brother   . Coronary artery disease Other        hx family    Social History   Tobacco Use  . Smoking status: Former Smoker    Packs/day: 2.00    Years: 25.00    Pack years: 50.00    Quit date: 1989    Years since quitting: 32.5  . Smokeless tobacco: Never Used  Vaping Use  . Vaping Use: Never used  Substance Use Topics  . Alcohol use: Yes    Comment: social  . Drug use: No    Comment: quit 25 years ago   Home Medications Prior to Admission medications   Medication Sig Start Date End Date Taking? Authorizing Provider  albuterol (PROVENTIL) (2.5 MG/3ML) 0.083% nebulizer solution Take 3 mLs (2.5 mg total) by nebulization every 6 (six) hours as needed for wheezing or shortness of breath. 06/14/19   Chesley Mires, MD  aspirin EC 81 MG tablet Take 1 tablet (81 mg total) by mouth daily. 06/24/17   Imogene Burn, PA-C  atorvastatin (LIPITOR) 20 MG tablet TAKE 1 TABLET BY MOUTH DAILY. 08/25/19   Burnell Blanks, MD  B Complex Vitamins (VITAMIN B COMPLEX PO) Take by mouth.    [provider]  Calcium Carbonate-Vitamin D (CALCIUM 600/VITAMIN D) 600-400 MG-UNIT per tablet Take 1 tablet by mouth 2 (two) times daily.      [provider]  dextromethorphan-guaiFENesin (ROBITUSSIN-DM) 10-100 MG/5ML liquid Take 10 mLs by mouth every 6 (six) hours as needed for cough.    [provider]  doxycycline (VIBRAMYCIN) 100 MG capsule Take 1 capsule (100 mg total) by mouth 2 (two) times daily as needed. 06/14/19   Chesley Mires, MD  econazole nitrate 1 % cream Apply topically daily.    [provider]  famotidine (PEPCID) 20 MG tablet Take 20 mg by mouth 2 (two) times daily as needed. 11/30/17   [provider]  hydrochlorothiazide (HYDRODIURIL) 25 MG tablet Take 25 mg by mouth daily.    [provider]  Melatonin 5 MG TABS Take 5 mg by mouth as needed.     [provider]  MISC NATURAL PRODUCTS PO Take 20 mg by mouth 2 (two) times daily. CBD Gummy, uses for anxiety and sleep    [provider]  nitroGLYCERIN (NITROSTAT) 0.4 MG SL tablet Place 1 tablet (0.4 mg total) under the tongue every 5 (five) minutes as needed for chest pain. 07/20/19   Burnell Blanks, MD  olmesartan (BENICAR) 20 MG tablet Take by mouth. 02/22/19   [provider]  psyllium (METAMUCIL) 58.6 % powder Take 1 packet by mouth 2 (two) times a day. Patient uses this mediation for regularity.    [provider]  Respiratory Therapy Supplies (FLUTTER) DEVI Use as directed 06/14/19   Chesley Mires, MD  sodium chloride HYPERTONIC 3 % nebulizer solution Take by nebulization in the morning and at bedtime. 06/14/19   Chesley Mires, MD  tadalafil (CIALIS) 20 MG tablet Take 20 mg by mouth daily  as needed. Patient uses this medication for ED.    [provider]  VITAMIN D, CHOLECALCIFEROL, PO Take 1 tablet by mouth daily.    [provider]  VITAMIN E PO Take 1 tablet by mouth daily.    [provider]   Allergies    Amlodipine  Review of Systems   Review of Systems  Eyes: Negative.   Respiratory: Negative.   Cardiovascular: Negative.   Gastrointestinal: Negative.    Physical Exam Updated Vital Signs BP (!) 151/88 Comment: Room Air  Pulse 89   Temp 98.7 F (37.1 C) (Oral)   Resp 19 Comment: Room Air  Ht 5\' 11"  (1.803 m)   Wt (!) 100.7 kg   SpO2 99%   BMI 30.96 kg/m   Physical Exam Constitutional:      General: He is awake. He is not in acute distress. HENT:     Head: Normocephalic and atraumatic.     Mouth/Throat:     Mouth: No injury.     Tongue: No lesions.  Eyes:     General: Lids are normal. No scleral icterus.    Conjunctiva/sclera: Conjunctivae normal.  Neck:     Vascular: No carotid bruit.  Cardiovascular:     Rate and Rhythm: Normal rate and regular rhythm.     Pulses: Normal pulses.     Heart sounds: Normal heart sounds.  Pulmonary:     Effort: Pulmonary effort is normal.     Breath sounds: Normal breath sounds.  Abdominal:     General:  Bowel sounds are normal. There is no distension.     Palpations: Abdomen is soft.     Tenderness: There is no abdominal tenderness.  Musculoskeletal:     Right lower leg: No edema.     Left lower leg: No edema.     Comments: L shoulder w/ decreased ROM, bony tenderness. No laceration. L hip normal ROM, no bony tenderness.  Skin:    General: Skin is warm and dry.     Capillary Refill: Capillary refill takes less than 2 seconds.  Neurological:     General: No focal deficit present.     Mental Status: He is alert and oriented to person, place, and time.  Psychiatric:        Behavior: Behavior is cooperative.        Cognition and Memory: Cognition normal.    ED Results / Procedures / Treatments   Labs (all labs ordered are listed, but only abnormal results are displayed) Labs Reviewed  BASIC METABOLIC PANEL - Abnormal; Notable for the following components:      Result Value   Sodium 121 (*)    Potassium 3.4 (*)    Chloride 89 (*)    CO2 20 (*)    Glucose, Bld 133 (*)    Calcium 8.6 (*)    All other components within normal limits  CBC - Abnormal; Notable for the following components:   WBC 14.8 (*)    All other components within normal limits  CBG MONITORING, ED - Abnormal; Notable for the following components:   Glucose-Capillary 121 (*)    All other components within normal limits  SARS CORONAVIRUS 2 BY RT PCR (HOSPITAL ORDER, [facility])  HEPATIC FUNCTION PANEL  URINALYSIS, ROUTINE W REFLEX MICROSCOPIC  NA AND K (SODIUM & POTASSIUM), RAND UR  OSMOLALITY, URINE  RAPID URINE DRUG SCREEN, HOSP PERFORMED  TROPONIN I (HIGH SENSITIVITY)   EKG None  Radiology  CT Head Wo Contrast  Result Date: 09/22/2019 CLINICAL DATA:  Syncope EXAM: CT HEAD WITHOUT CONTRAST TECHNIQUE: Contiguous axial images were obtained from the base of the skull through the vertex without intravenous contrast. COMPARISON:  None. FINDINGS: Brain: No acute territorial infarction,  hemorrhage or intracranial mass. Mild atrophy. Mild hypodensity in the white matter consistent with chronic small vessel ischemic change. Nonenlarged ventricles. Vascular: No hyperdense vessels. Vertebral and carotid vascular calcification. Skull: Normal. Negative for fracture or focal lesion. Sinuses/Orbits: No acute finding. Other: None IMPRESSION: 1. No CT evidence for acute intracranial abnormality. 2. Atrophy and mild chronic small vessel ischemic changes of the white matter. Electronically Signed   By: Donavan Foil M.D.   On: 09/22/2019 19:35   DG Chest Portable 1 View  Result Date: 09/22/2019 CLINICAL DATA:  Syncope. EXAM: PORTABLE CHEST 1 VIEW COMPARISON:  Jul 10, 2016. FINDINGS: The heart size and mediastinal contours are within normal limits. Mild bibasilar subsegmental atelectasis is noted. Stable elevated left hemidiaphragm is noted. No pneumothorax or pleural effusion is noted. The visualized skeletal structures are unremarkable. IMPRESSION: No active disease. Electronically Signed   By: Marijo Conception M.D.   On: 09/22/2019 19:09   DG Shoulder Left  Result Date: 09/22/2019 CLINICAL DATA:  Recent fall with left shoulder pain, initial encounter EXAM: LEFT SHOULDER - 2+ VIEW COMPARISON:  None. FINDINGS: Mild degenerative changes of the acromioclavicular joint are noted. No acute fracture or dislocation is noted in the shoulder joint. The underlying bony thorax shows no acute abnormality. No soft tissue changes are seen. IMPRESSION: No acute abnormality noted. Electronically Signed   By: Inez Catalina M.D.   On: 09/22/2019 19:36   Procedures Procedures (including critical care time)  Medications Ordered in ED Medications - No data to display  ED Course  I have reviewed the triage vital signs and the nursing notes.  Pertinent labs & imaging results that were available during my care of the patient were reviewed by me and considered in my medical decision making (see chart for details).     MDM Rules/Calculators/A&P  Patient is 74yo male with HTN, HLD, CAD s/p PCI who presents to ED after syncopal episode this afternoon. On arrival, patient is afebrile, HDS. On exam, bony tenderness and decreased ROM on L shoulder. No focal deficits. ECG normal sinus rhythm. Labs pending. Ddx vasovagal v drug-induced. Pending labs and images.  BMP revealed Na 121, likely contributing to patient's dizziness and syncopal episode. Patient said he has been practicing intermittent fasting for the past few months, only eating during 6-8 hours per day. Patient also on diuretic. Giving 1L bolus, obtaining UOsm and electrolytes. Patient agreeable for admission for further work-up.  Patient still in ED at end of shift. Further evaluation and management per attending physician.                           Final Clinical Impression(s) / ED Diagnoses Final diagnoses:  Syncope and collapse  Hyponatremia   Rx / DC Orders ED Discharge Orders    None       Sanjuan Dame, MD 09/24/19 0229    Lennice Sites, DO 09/24/19 1458

## 2019-09-22 NOTE — ED Triage Notes (Signed)
Pt had a syncopal episode around 1430. States he was outside and became dizzy. States it could be the heat or "something I smoked".  C/o L shoulder pain from the fall.

## 2019-09-22 NOTE — ED Notes (Signed)
ED Provider at bedside. 

## 2019-09-22 NOTE — ED Provider Notes (Signed)
I have personally seen and examined the patient. I have reviewed the documentation on PMH/FH/Soc Hx. I have discussed the plan of care with the resident and patient.  I have reviewed and agree with the resident's documentation. Please see associated encounter note.  Briefly, the patient is a 74 y.o. male here with CAD, high cholesterol who presents to the ED after syncopal episode.  Patient with overall unremarkable vitals.  EKG as interpreted by myself shows sinus rhythm.  No ischemic changes.  Patient was smoking CBD that he had purchased and when he came in from the house got lightheaded and dizzy and passed out.  He had not had much to eat or drink.  He does drink 2-3 alcoholic beverages daily.  Feels weak and lightheaded at this time.  Did not think he hit his head.  Has some left shoulder pain.  Patient appears neurologically intact.  Lab work was obtained that showed sodium of 121.  Usually patient is in the 130s.  I suspect that this is volume as he appears clinically dehydrated.  He is also on a diuretic.  CT scan of the head was normal.  Troponin normal.  Otherwise lab work was unremarkable.  Chest x-ray and shoulder x-ray also normal.  Given hyponatremia and syncope will admit for further care.  .Critical Care Performed by: Lennice Sites, DO Authorized by: Lennice Sites, DO   Critical care provider statement:    Critical care time (minutes):  35   Critical care was necessary to treat or prevent imminent or life-threatening deterioration of the following conditions:  Dehydration and metabolic crisis   Critical care was time spent personally by me on the following activities:  Blood draw for specimens, development of treatment plan with patient or surrogate, discussions with primary provider, evaluation of patient's response to treatment, examination of patient, obtaining history from patient or surrogate, ordering and performing treatments and interventions, ordering and review of laboratory  studies, ordering and review of radiographic studies, pulse oximetry, re-evaluation of patient's condition and review of old charts   I assumed direction of critical care for this patient from another provider in my specialty: no           Lennice Sites, DO 09/22/19 2055

## 2019-09-23 ENCOUNTER — Encounter (HOSPITAL_COMMUNITY): Payer: Self-pay | Admitting: Internal Medicine

## 2019-09-23 ENCOUNTER — Inpatient Hospital Stay (HOSPITAL_COMMUNITY): Payer: PPO

## 2019-09-23 DIAGNOSIS — R55 Syncope and collapse: Secondary | ICD-10-CM

## 2019-09-23 DIAGNOSIS — E782 Mixed hyperlipidemia: Secondary | ICD-10-CM

## 2019-09-23 DIAGNOSIS — E871 Hypo-osmolality and hyponatremia: Secondary | ICD-10-CM

## 2019-09-23 DIAGNOSIS — Z9989 Dependence on other enabling machines and devices: Secondary | ICD-10-CM

## 2019-09-23 DIAGNOSIS — K219 Gastro-esophageal reflux disease without esophagitis: Secondary | ICD-10-CM

## 2019-09-23 DIAGNOSIS — I2510 Atherosclerotic heart disease of native coronary artery without angina pectoris: Secondary | ICD-10-CM

## 2019-09-23 DIAGNOSIS — I10 Essential (primary) hypertension: Secondary | ICD-10-CM

## 2019-09-23 DIAGNOSIS — Z7289 Other problems related to lifestyle: Secondary | ICD-10-CM

## 2019-09-23 DIAGNOSIS — G4733 Obstructive sleep apnea (adult) (pediatric): Secondary | ICD-10-CM

## 2019-09-23 DIAGNOSIS — Z789 Other specified health status: Secondary | ICD-10-CM

## 2019-09-23 DIAGNOSIS — E669 Obesity, unspecified: Secondary | ICD-10-CM

## 2019-09-23 LAB — BASIC METABOLIC PANEL
Anion gap: 10 (ref 5–15)
Anion gap: 8 (ref 5–15)
BUN: 12 mg/dL (ref 8–23)
BUN: 10 mg/dL (ref 8–23)
CO2: 21 mmol/L — ABNORMAL LOW (ref 22–32)
CO2: 21 mmol/L — ABNORMAL LOW (ref 22–32)
Calcium: 8.2 mg/dL — ABNORMAL LOW (ref 8.9–10.3)
Calcium: 8.2 mg/dL — ABNORMAL LOW (ref 8.9–10.3)
Chloride: 91 mmol/L — ABNORMAL LOW (ref 98–111)
Chloride: 94 mmol/L — ABNORMAL LOW (ref 98–111)
Creatinine, Ser: 0.74 mg/dL (ref 0.61–1.24)
Creatinine, Ser: 0.65 mg/dL (ref 0.61–1.24)
GFR calc Af Amer: >60 mL/min (ref >60)
GFR calc Af Amer: >60 mL/min (ref >60)
GFR calc non Af Amer: >60 mL/min (ref >60)
GFR calc non Af Amer: >60 mL/min (ref >60)
Glucose, Bld: 110 mg/dL — ABNORMAL HIGH (ref 70–99)
Glucose, Bld: 105 mg/dL — ABNORMAL HIGH (ref 70–99)
Potassium: 3.1 mmol/L — ABNORMAL LOW (ref 3.5–5.1)
Potassium: 3.7 mmol/L (ref 3.5–5.1)
Sodium: 122 mmol/L — ABNORMAL LOW (ref 135–145)
Sodium: 123 mmol/L — ABNORMAL LOW (ref 135–145)

## 2019-09-23 LAB — CBC
HCT: 38.2 % — ABNORMAL LOW (ref 39.0–52.0)
Hemoglobin: 13.5 g/dL (ref 13.0–17.0)
MCH: 34.4 pg — ABNORMAL HIGH (ref 26.0–34.0)
MCHC: 35.3 g/dL (ref 30.0–36.0)
MCV: 97.4 fL (ref 80.0–100.0)
Platelets: 232 10*3/uL (ref 150–400)
RBC: 3.92 MIL/uL — ABNORMAL LOW (ref 4.22–5.81)
RDW: 12.2 % (ref 11.5–15.5)
WBC: 8.2 10*3/uL (ref 4.0–10.5)
nRBC: 0 % (ref 0.0–0.2)

## 2019-09-23 LAB — OSMOLALITY: Osmolality: 255 mOsm/kg — ABNORMAL LOW (ref 275–295)

## 2019-09-23 LAB — TSH: TSH: 2.736 u[IU]/mL (ref 0.350–4.500)

## 2019-09-23 MED ORDER — MELATONIN 5 MG PO TABS: 5.0000 mg | ORAL_TABLET | Freq: Every evening | ORAL | Status: DC | PRN

## 2019-09-23 MED ORDER — POTASSIUM CHLORIDE 20 MEQ PO PACK
40.0000 meq | PACK | Freq: Two times a day (BID) | ORAL | Status: DC
  Administered 2019-09-23 – 2019-09-25 (×6): 40 meq via ORAL
  Filled 2019-09-23 (×5): qty 2

## 2019-09-23 MED ORDER — ENOXAPARIN SODIUM 40 MG/0.4ML ~~LOC~~ SOLN
40.0000 mg | SUBCUTANEOUS | Status: DC
  Administered 2019-09-23 – 2019-09-25 (×3): 40 mg via SUBCUTANEOUS
  Filled 2019-09-23 (×3): qty 0.4

## 2019-09-23 MED ORDER — LORAZEPAM 2 MG/ML IJ SOLN
1.0000 mg | INTRAMUSCULAR | Status: DC | PRN
  Administered 2019-09-23: 1 mg via INTRAVENOUS
  Filled 2019-09-23: qty 1

## 2019-09-23 MED ORDER — ALBUTEROL SULFATE (2.5 MG/3ML) 0.083% IN NEBU
2.5000 mg | INHALATION_SOLUTION | Freq: Two times a day (BID) | RESPIRATORY_TRACT | Status: DC
  Administered 2019-09-23 – 2019-09-24 (×3): 2.5 mg via RESPIRATORY_TRACT
  Filled 2019-09-23 (×3): qty 3

## 2019-09-23 MED ORDER — ACETAMINOPHEN 325 MG PO TABS
650.0000 mg | ORAL_TABLET | ORAL | Status: DC | PRN
  Administered 2019-09-23 – 2019-09-25 (×6): 650 mg via ORAL
  Filled 2019-09-23 (×6): qty 2

## 2019-09-23 MED ORDER — ASPIRIN EC 81 MG PO TBEC
81.0000 mg | DELAYED_RELEASE_TABLET | Freq: Every day | ORAL | Status: DC
  Administered 2019-09-23 – 2019-09-25 (×3): 81 mg via ORAL
  Filled 2019-09-23 (×3): qty 1

## 2019-09-23 MED ORDER — FAMOTIDINE 20 MG PO TABS: 20.0000 mg | ORAL_TABLET | Freq: Two times a day (BID) | ORAL | Status: DC | PRN

## 2019-09-23 MED ORDER — ALBUTEROL SULFATE (2.5 MG/3ML) 0.083% IN NEBU
2.5000 mg | INHALATION_SOLUTION | RESPIRATORY_TRACT | Status: DC | PRN
  Administered 2019-09-23 – 2019-09-25 (×2): 2.5 mg via RESPIRATORY_TRACT
  Filled 2019-09-23 (×2): qty 3

## 2019-09-23 MED ORDER — IRBESARTAN 150 MG PO TABS
150.0000 mg | ORAL_TABLET | Freq: Every day | ORAL | Status: DC
  Administered 2019-09-23 – 2019-09-25 (×3): 150 mg via ORAL
  Filled 2019-09-23 (×3): qty 1

## 2019-09-23 MED ORDER — SODIUM CHLORIDE 0.9 % IV SOLN: INTRAVENOUS | Status: AC

## 2019-09-23 MED ORDER — KETOROLAC TROMETHAMINE 30 MG/ML IJ SOLN: 15.0000 mg | Freq: Once | INTRAMUSCULAR | Status: DC

## 2019-09-23 MED ORDER — ATORVASTATIN CALCIUM 20 MG PO TABS
20.0000 mg | ORAL_TABLET | Freq: Every day | ORAL | Status: DC
  Administered 2019-09-23 – 2019-09-25 (×3): 20 mg via ORAL
  Filled 2019-09-23 (×3): qty 1

## 2019-09-23 MED ORDER — MELATONIN 5 MG PO TABS: 5.0000 mg | ORAL_TABLET | ORAL | Status: DC | PRN

## 2019-09-23 NOTE — Progress Notes (Signed)
Carotid duplex has been completed.   Preliminary results in CV Proc.   Abram Sander 09/23/2019 2:46 PM

## 2019-09-23 NOTE — H&P (Addendum)
History and Physical    Tommy Santiago HCW:[id number] DOB: [birth year] DOA: 09/22/2019  PCP: Bernerd Limbo, MD  Patient coming from: [facility]   I have personally briefly reviewed patient's old medical records in [place]  Chief Complaint: Syncope and hyponatremia  HPI: Tommy Santiago is a 74 y.o. 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  Patient reports this afternoon he has been close to an hour outside watering plants and was sweating.  He went inside his house and had acute dizziness and reportedly wife heard him fall and found him unconscious.  He then came to and felt pain to his left shoulder and hip.  Does not think he hurt his head and denies any headache.  Wife then took his blood pressure and he was noted to have systolic in the 17O and wife had him drink several glasses of water prior to being evaluated in the ED.   Prior to syncopal episode, he had only drank about 12 ounces of water, took his HCTZ in the morning and also had coffee and had an oz of bourbon.  Also took some CBD oil that he did not realize had cannabinoid and it.  Normally has about 3 beers or 2 cocktails daily and has been doing this for about a year.  States he  Also has been doing intermittent fasting from 1800 to 10:00 the following day and  only eating 1800 cal daily since March in order to lose weight. Has felt some nausea and epigastric pain in the ED but denies any vomiting or diarrhea.  No chest pain or palpitations.  In the ED, patient was afebrile and hypertensive up to 160V systolic.  He had leukocytosis of 14.8.  Had hyponatremia of 121 with sodium of 136 back in January.  Hypokalemia of 3.4.  Chloride of 89.  Blood glucose of 133.  Normal creatinine of  0.87. Urine sodium of 63, urine osmolality of 267. Trop of 5. NSR on EKG. UDS positive for marijuana.  Had negative CT head, left shoulder x-ray and chest x-ray.  He was given 1 L normal saline bolus in the ED as well as Tylenol and Maalox.   Review of Systems:  Constitutional: No Weight Change, No Fever ENT/Mouth: No sore throat, No Rhinorrhea Eyes: No Eye Pain, No Vision Changes Cardiovascular: No Chest Pain, no SOB,No Palpitations Respiratory: No Cough, No Sputum, Gastrointestinal: + Nausea, No Vomiting, No Diarrhea Genitourinary: no Urinary Incontinence Musculoskeletal: No Arthralgias, No Myalgias Skin: No Skin Lesions, No Pruritus, Neuro: no Weakness, No Numbness,  + Loss of Consciousness, + Syncope Psych: No Anxiety/Panic, No Depression, no decrease appetite Heme/Lymph: No Bruising, No Bleeding  Past Medical History:  Diagnosis Date  . Bronchiectasis ([place])   . Bronchiectasis ([place])   . CAP (community acquired pneumonia)   . Coronary atherosclerosis of unspecified type of vessel, native or graft    status post cardiac cath w percutaneous coronary intervention using XIENCE drug-eluting stent to mid left anterior descending 07/23/08; LHC 08/07/11: Mid LAD stent patent, moderate size diagonal branch jailed by stent with 70% ostial stenosis, unchanged from 2010 with excellent flow down the diagonal branch, mid circumflex 30%, proximal and mid RCA 20%, EF 60%.  Medical therapy continued.   Marland Kitchen GERD (gastroesophageal reflux disease)   .  Hepatitis C    Treated [place]  . Hyperlipemia   . Hypertension   . Hypopotassemia   . Lichen planus   . Other and unspecified hyperlipidemia   . Other specified cardiac dysrhythmias(427.89)   . Paroxysmal supraventricular tachycardia ([place])   . Personal history of other infectious and parasitic disease   . Plantar fascial fibromatosis   . Pneumonia   . Syncope and collapse     Past Surgical History:  Procedure Laterality Date  . CATARACT  EXTRACTION    . CHOLECYSTECTOMY    . COLONOSCOPY    . colonscopy     status post  . CORONARY ANGIOPLASTY WITH STENT PLACEMENT    . ESOPHAGOGASTRODUODENOSCOPY     status post  . INSERTION OF MESH N/A 01/30/2017   Procedure: INSERTION OF MESH;  Surgeon: Georganna Skeans, MD;  Location: [place];  Service: General;  Laterality: N/A;  . KNEE ARTHROSCOPY    . LEFT HEART CATHETERIZATION WITH CORONARY ANGIOGRAM  08/07/2011   Procedure: LEFT HEART CATHETERIZATION WITH CORONARY ANGIOGRAM;  Surgeon: Burnell Blanks, MD;  Location: [facility] CATH LAB;  Service: Cardiovascular;;  . UMBILICAL HERNIA REPAIR N/A 01/30/2017   Procedure: REPAIR OF UMBILICAL HERNIA;  Surgeon: Georganna Skeans, MD;  Location: [place];  Service: General;  Laterality: N/A;     reports that he quit smoking about 32 years ago. He has a 50.00 pack-year smoking history. He has never used smokeless tobacco. He reports current alcohol use of about 14.0 standard drinks of alcohol per week. He reports that he does not use drugs. Social History  Allergies  Allergen Reactions  . Amlodipine Swelling    Ankle swelling    Family History  Problem Relation Age of Onset  . Heart disease Father   . Coronary artery disease Brother        diagnosed in early 96s  . Rectal cancer Brother   . Coronary artery disease Other        hx family     Prior to Admission medications   Medication Sig Start Date End Date Taking? Authorizing Provider  albuterol (PROVENTIL) (2.5 MG/3ML) 0.083% nebulizer solution Take 3 mLs (2.5 mg total) by nebulization every 6 (six) hours as needed for wheezing or shortness of breath. 06/14/19  Yes Chesley Mires, MD  aspirin EC 81 MG tablet Take 1 tablet (81 mg total) by mouth daily. 06/24/17  Yes Imogene Burn, PA-C  atorvastatin (LIPITOR) 20 MG tablet TAKE 1 TABLET BY MOUTH DAILY. Patient taking differently: Take 20 mg by mouth daily.  08/25/19  Yes Burnell Blanks, MD  B  Complex Vitamins (VITAMIN B COMPLEX PO) Take 1 tablet by mouth daily.    Yes [provider]  Calcium Carbonate-Vitamin D (CALCIUM 600/VITAMIN D) 600-400 MG-UNIT per tablet Take 1 tablet by mouth 2 (two) times daily.     Yes [provider]  dextromethorphan-guaiFENesin (ROBITUSSIN-DM) 10-100 MG/5ML liquid Take 10 mLs by mouth every 6 (six) hours as needed for cough.   Yes [provider]  econazole nitrate 1 % cream Apply 1 application topically daily as needed (dry).    Yes [provider]  famotidine (PEPCID) 20 MG tablet Take 20 mg by mouth 2 (two) times daily as needed for heartburn.  11/30/17  Yes [provider]  hydrochlorothiazide (HYDRODIURIL) 25 MG tablet Take 25 mg by mouth daily.   Yes [provider]  Melatonin 5 MG TABS Take 5 mg by mouth as  needed (sleep).    Yes [provider]  MISC NATURAL PRODUCTS PO Take 20 mg by mouth daily as needed (sleep). CBD Gummy, uses for anxiety and sleep    Yes [provider]  nitroGLYCERIN (NITROSTAT) 0.4 MG SL tablet Place 1 tablet (0.4 mg total) under the tongue every 5 (five) minutes as needed for chest pain. 07/20/19  Yes Burnell Blanks, MD  olmesartan (BENICAR) 20 MG tablet Take 20 mg by mouth daily.  02/22/19  Yes [provider]  psyllium (METAMUCIL) 58.6 % powder Take 1 packet by mouth 2 (two) times a day. Patient uses this mediation for regularity.   Yes [provider]  sodium chloride HYPERTONIC 3 % nebulizer solution Take by nebulization in the morning and at bedtime. 06/14/19  Yes Chesley Mires, MD  VITAMIN D, CHOLECALCIFEROL, PO Take 1 tablet by mouth daily.   Yes [provider]  VITAMIN E PO Take 1 tablet by mouth daily.   Yes [provider]  doxycycline (VIBRAMYCIN) 100 MG capsule Take 1 capsule (100 mg total) by mouth 2 (two) times daily as needed. Patient not taking: Reported on 09/23/2019 06/14/19   Chesley Mires, MD    Respiratory Therapy Supplies (FLUTTER) DEVI Use as directed 06/14/19   Chesley Mires, MD  tadalafil (CIALIS) 20 MG tablet Take 20 mg by mouth daily as needed. Patient uses this medication for ED.    [provider]    Physical Exam: Vitals:   09/22/19 2230 09/22/19 2300 09/22/19 2311 09/23/19 0014  BP: (!) 148/88 (!) 152/94  (!) 153/78  Pulse: 71 64  61  Resp: 16 16  22   Temp:   98.8 F (37.1 C) 97.7 F (36.5 C)  TempSrc:   Oral Oral  SpO2: 96% 96%  97%  Weight:      Height:        Constitutional: NAD, calm, comfortable, obese male laying flat in bed Vitals:   09/22/19 2230 09/22/19 2300 09/22/19 2311 09/23/19 0014  BP: (!) 148/88 (!) 152/94  (!) 153/78  Pulse: 71 64  61  Resp: 16 16  22   Temp:   98.8 F (37.1 C) 97.7 F (36.5 C)  TempSrc:   Oral Oral  SpO2: 96% 96%  97%  Weight:      Height:       Eyes: PERRL, lids and conjunctivae normal ENMT: Mucous membranes are moist.  Has full facial beard. neck: normal, supple Respiratory: clear to auscultation bilaterally, no wheezing, no crackles. Normal respiratory effort. No accessory muscle use.  Cardiovascular: Regular rate and rhythm, no murmurs / rubs / gallops. No extremity edema. 2+ pedal pulses.   Abdomen: Mild epigastric tenderness without any guarding, rigidity or rebound tenderness, no masses palpated.  Bowel sounds positive.  Musculoskeletal: no clubbing / cyanosis. No joint deformity upper and lower extremities.  Pain to [facility] joint of the left shoulder with active range of motion.  Skin: no rashes, lesions, ulcers. No induration.  Tattoos on  upper and lower extremity. Neurologic: CN 2-12 grossly intact.  No nystagmus.  Sensation intact,  Strength 5/5 in all 4.  Psychiatric: Normal judgment and insight. Alert and oriented x 3. Normal mood.     Labs on Admission: I have personally reviewed following labs and imaging studies  CBC: Recent Labs  Lab 09/22/19 1842  WBC 14.8*  HGB 14.5  HCT 41.7  MCV 97.2   PLT 563   Basic Metabolic Panel: Recent Labs  Lab 09/22/19 1842  NA 121*  K 3.4*  CL 89*  CO2 20*  GLUCOSE 133*  BUN 15  CREATININE 0.87  CALCIUM 8.6*   GFR: Estimated Creatinine Clearance: 92.8 mL/min (by C-G formula based on SCr of 0.87 mg/dL). Liver Function Tests: Recent Labs  Lab 09/22/19 1842  AST 23  ALT 24  ALKPHOS 53  BILITOT 0.9  PROT 7.0  ALBUMIN 4.4   No results for input(s): LIPASE, AMYLASE in the last 168 hours. No results for input(s): AMMONIA in the last 168 hours. Coagulation Profile: No results for input(s): INR, PROTIME in the last 168 hours. Cardiac Enzymes: No results for input(s): CKTOTAL, CKMB, CKMBINDEX, TROPONINI in the last 168 hours. BNP (last 3 results) No results for input(s): PROBNP in the last 8760 hours. HbA1C: No results for input(s): HGBA1C in the last 72 hours. CBG: Recent Labs  Lab 09/22/19 1848  GLUCAP 121*   Lipid Profile: No results for input(s): CHOL, HDL, LDLCALC, TRIG, CHOLHDL, LDLDIRECT in the last 72 hours. Thyroid Function Tests: No results for input(s): TSH, T4TOTAL, FREET4, T3FREE, THYROIDAB in the last 72 hours. Anemia Panel: No results for input(s): VITAMINB12, FOLATE, FERRITIN, TIBC, IRON, RETICCTPCT in the last 72 hours. Urine analysis:    Component Value Date/Time   COLORURINE YELLOW 09/22/2019 2024   APPEARANCEUR CLEAR 09/22/2019 2024   LABSPEC <1.005 (L) 09/22/2019 2024   PHURINE 6.0 09/22/2019 2024   GLUCOSEU NEGATIVE 09/22/2019 2024   HGBUR NEGATIVE 09/22/2019 2024   BILIRUBINUR NEGATIVE 09/22/2019 2024   KETONESUR 15 (A) 09/22/2019 2024   PROTEINUR NEGATIVE 09/22/2019 2024   UROBILINOGEN 1.0 06/23/2008 0813   NITRITE NEGATIVE 09/22/2019 2024   LEUKOCYTESUR NEGATIVE 09/22/2019 2024    Radiological Exams on Admission: CT Head Wo Contrast  Result Date: 09/22/2019 CLINICAL DATA:  Syncope EXAM: CT HEAD WITHOUT CONTRAST TECHNIQUE: Contiguous axial images were obtained from the base of the skull  through the vertex without intravenous contrast. COMPARISON:  None. FINDINGS: Brain: No acute territorial infarction, hemorrhage or intracranial mass. Mild atrophy. Mild hypodensity in the white matter consistent with chronic small vessel ischemic change. Nonenlarged ventricles. Vascular: No hyperdense vessels. Vertebral and carotid vascular calcification. Skull: Normal. Negative for fracture or focal lesion. Sinuses/Orbits: No acute finding. Other: None IMPRESSION: 1. No CT evidence for acute intracranial abnormality. 2. Atrophy and mild chronic small vessel ischemic changes of the white matter. Electronically Signed   By: Donavan Foil M.D.   On: 09/22/2019 19:35   DG Chest Portable 1 View  Result Date: 09/22/2019 CLINICAL DATA:  Syncope. EXAM: PORTABLE CHEST 1 VIEW COMPARISON:  Jul 10, 2016. FINDINGS: The heart size and mediastinal contours are within normal limits. Mild bibasilar subsegmental atelectasis is noted. Stable elevated left hemidiaphragm is noted. No pneumothorax or pleural effusion is noted. The visualized skeletal structures are unremarkable. IMPRESSION: No active disease. Electronically Signed   By: Marijo Conception M.D.   On: 09/22/2019 19:09   DG Shoulder Left  Result Date: 09/22/2019 CLINICAL DATA:  Recent fall with left shoulder pain, initial encounter EXAM: LEFT SHOULDER - 2+ VIEW COMPARISON:  None. FINDINGS: Mild degenerative changes of the acromioclavicular joint are noted. No acute fracture or dislocation is noted in the shoulder joint. The underlying bony thorax shows no acute abnormality. No soft tissue changes are seen. IMPRESSION: No acute abnormality noted. Electronically Signed   By: Inez Catalina M.D.   On: 09/22/2019 19:36      Assessment/Plan  Syncope/LOC Continue to monitor on telemetry.  Suspect more likely  due to polysubstance use and dehydration. Just had recent echocardiogram on 08/16/2019 with LVEF of 60 to 65% and grade 2 diastolic dysfunction. Troponin of 5.  Will not repeat echocardiogram at this time as unlikely to be cardiogenic. check orthostatic vital signs  Hyponatremia Likely hypovolemic due to hypovolemia, alcohol use and chronic HCTZ use. Urine sodium was 63, urine osmolality of 267 after receiving IV fluids. Will check repeat sodium since he has received 1 L normal saline fluid in the ED Will hold HCTZ  Alcohol use Reports drinking either 2 beers or 2 cocktails daily for the past year.  Last had an ounce of bourbon prior to admission. Monitor vitals for any signs of withdrawal  Hypertension Hold HCTZ due to hyponatremia, continue ACE  CAD Continue aspirin  OSA Continue CPAP  Hyperlipidemia Continue statin   GERD continue famotidine  Obesity BMI of 30  DVT prophylaxis:.Lovenox Code Status: Full Family Communication: Plan discussed with patient at bedside  disposition Plan: Home with at least 2 midnight stays  Consults called:  Admission status: inpatient   Status is: Inpatient  Remains inpatient appropriate because:Inpatient level of care appropriate due to severity of illness   Dispo: The patient is from: Home              Anticipated d/c is to: Home              Anticipated d/c date is: 2 days              Patient currently is not medically stable to d/c.         Orene Desanctis DO [facility]   If 7PM-7AM, please contact night-coverage [email or web address]   09/23/2019, 12:56 AM

## 2019-09-23 NOTE — Progress Notes (Signed)
74 year old gentleman with prior history of hypertension coronary artery disease, paroxysmal SVT, obstructive sleep apnea on CPAP, hyperlipidemia, prediabetes presents to med [facility] for her syncope and hyponatremia. On arrival to ED he was found to have hyponatremia.  He was referred to [facility] for admission and  was admitted by Dr. Flossie Buffy this morning for further evaluation and management of syncope.   Patient seen and examined at bedside he is alert and following commands.  Denies any dizziness headache or any other complaints.     CVS s1s2 heard, RRR, no JVD.  Lungs clear to auscultation.  abd is soft non tender non distended.  Extremities no pedal edema.  Neuro: alert and oriented and no focal deficits.    Assessment and Plan   Evaluate for syncope probably secondary to dehydration and polysubstance abuse. Recheck orthostatic vital signs and get carotid duplex to complete the work-up.   Continue IV fluids for hyponatremia probably secondary to dehydration.  Continue to hold hydrochlorothiazide's.   Watch for alcohol withdrawal symptoms at this time.  Hosie Poisson MD

## 2019-09-23 NOTE — Progress Notes (Signed)
Patient admitted to unit from [facility]. Transferred from stretcher to bed by stand/pivot. VS stable, BP elevated at 153/78. See flowsheets for assessment. No c/o pain at this time, just weakness in lower extremity. Placed on fall precautions with bed alarm. Heart monitor on. MD notified of patient arrival.

## 2019-09-24 ENCOUNTER — Inpatient Hospital Stay (HOSPITAL_COMMUNITY): Payer: PPO

## 2019-09-24 LAB — BASIC METABOLIC PANEL
Anion gap: 11 (ref 5–15)
BUN: 10 mg/dL (ref 8–23)
CO2: 20 mmol/L — ABNORMAL LOW (ref 22–32)
Calcium: 9.1 mg/dL (ref 8.9–10.3)
Chloride: 100 mmol/L (ref 98–111)
Creatinine, Ser: 0.74 mg/dL (ref 0.61–1.24)
GFR calc Af Amer: >60 mL/min (ref >60)
GFR calc non Af Amer: >60 mL/min (ref >60)
Glucose, Bld: 117 mg/dL — ABNORMAL HIGH (ref 70–99)
Potassium: 4.2 mmol/L (ref 3.5–5.1)
Sodium: 131 mmol/L — ABNORMAL LOW (ref 135–145)

## 2019-09-24 LAB — CORTISOL: Cortisol, Plasma: 18.4 ug/dL

## 2019-09-24 MED ORDER — TRAMADOL HCL 50 MG PO TABS
50.0000 mg | ORAL_TABLET | Freq: Four times a day (QID) | ORAL | Status: DC | PRN
  Administered 2019-09-24 – 2019-09-25 (×4): 50 mg via ORAL
  Filled 2019-09-24 (×4): qty 1

## 2019-09-24 MED ORDER — KETOROLAC TROMETHAMINE 15 MG/ML IJ SOLN: 15.0000 mg | Freq: Three times a day (TID) | INTRAMUSCULAR | Status: AC | PRN

## 2019-09-24 NOTE — Progress Notes (Signed)
PROGRESS NOTE    Tommy Santiago  GNO:[id number] DOB: 1945/08/19 DOA: 09/22/2019 PCP: Bernerd Limbo, MD    Chief Complaint  Patient presents with   Loss of Consciousness    Brief Narrative: 74 year old gentleman with prior history of hypertension coronary artery disease, paroxysmal SVT, obstructive sleep apnea on CPAP, hyperlipidemia, prediabetes presents to med [facility] for her syncope and hyponatremia. On arrival to ED he was found to have hyponatremia.  He was referred to [facility] for admission . He was stared on gentle hydration and repeat sodium improved to 131.  Pt reports worsening left shoulder pain, and unable to raise his left arm beyond the shoulder.  CT of the left shoulder ordered for further evaluation.   Assessment & Plan:   Principal Problem:   Syncope Active Problems:   Hyperlipidemia   Essential hypertension   CAD, NATIVE VESSEL   Obesity (BMI 30-39.9)   Hyponatremia   OSA on CPAP   Alcohol use   GERD (gastroesophageal reflux disease)    Syncope: Probably from dehydration and polysubstance abuse .  Pt fell on his left shoulder.  Pt reports worsening pain and unable to lift his arm beyond the shoulder.  CT shoulder ordered for further evaluation.  Added on IV TORADOL and Tramadol for pain control.   Body mass index is 30.96 kg/m.   CAD: Pt denies any chest pain.    Hyponatremia: Probably sec to dehydration.  Improving with IV fluids.  Sodium is 131 today.    OSA on CPAP.    GERD  Stable.    Essential hypertension:  Well controlled.    DVT prophylaxis: (Lovenox) Code Status:  Full code.  Family Communication: (none at bedside.  Disposition:   Status is: Inpatient  Remains inpatient appropriate because:IV treatments appropriate due to intensity of illness or inability to take PO   Dispo: The patient is from: Home              Anticipated d/c is to: Home              Anticipated d/c date is: 1 day               Patient currently is not medically stable to d/c.       Consultants:   None.    Procedures:  CT left shoulder  Acute minimally displaced fracture of the posterolateral aspect of the left third rib. 2. Intact glenohumeral and acromioclavicular joints without fracture or dislocation. 3. Soft tissue swelling and edema over the anterosuperior aspect of the shoulder without discrete hematoma.  4. Chronic compression fracture of T7.   Antimicrobials: none.    Subjective: Left shoulder pain.   Objective: Vitals:   09/23/19 2113 09/24/19 0546 09/24/19 0727 09/24/19 1255  BP: (!) 141/72 (!) 131/77  113/71  Pulse: 65 63  62  Resp: 18 18  16   Temp: 97.9 F (36.6 C) 98 F (36.7 C)  97.8 F (36.6 C)  TempSrc: Oral Oral  Oral  SpO2: 97% 96% 95% 98%  Weight:      Height:        Intake/Output Summary (Last 24 hours) at 09/24/2019 1515 Last data filed at 09/24/2019 1300 Gross per 24 hour  Intake 720 ml  Output 500 ml  Net 220 ml   Filed Weights   09/22/19 1825  Weight: (!) 100.7 kg    Examination:  General exam: Appears calm and comfortable  Respiratory system: Clear to auscultation. Respiratory effort  normal. Cardiovascular system: S1 & S2 heard, RRR. No JVD, . No pedal edema. Gastrointestinal system: Abdomen is nondistended, soft and nontender.  Normal bowel sounds heard. Central nervous system: Alert and oriented. No focal neurological deficits. Extremities: left shoulder tenderness.  Skin: No rashes, lesions or ulcers Psychiatry:  Mood & affect appropriate.     Data Reviewed: I have personally reviewed following labs and imaging studies  CBC: Recent Labs  Lab 09/22/19 1842 09/23/19 0547  WBC 14.8* 8.2  HGB 14.5 13.5  HCT 41.7 38.2*  MCV 97.2 97.4  PLT 261 016    Basic Metabolic Panel: Recent Labs  Lab 09/22/19 1842 09/23/19 0045 09/23/19 0547 09/24/19 0832  NA 121* 122* 123* 131*  K 3.4* 3.1* 3.7 4.2  CL 89* 91* 94* 100  CO2 20* 21* 21*  20*  GLUCOSE 133* 110* 105* 117*  BUN 15 12 10 10   CREATININE 0.87 0.74 0.65 0.74  CALCIUM 8.6* 8.2* 8.2* 9.1    GFR: Estimated Creatinine Clearance: 100.9 mL/min (by C-G formula based on SCr of 0.74 mg/dL).  Liver Function Tests: Recent Labs  Lab 09/22/19 1842  AST 23  ALT 24  ALKPHOS 53  BILITOT 0.9  PROT 7.0  ALBUMIN 4.4    CBG: Recent Labs  Lab 09/22/19 1848  GLUCAP 121*     Recent Results (from the past 240 hour(s))  SARS Coronavirus 2 by RT PCR (hospital order, performed in [facility] hospital lab) Nasopharyngeal Nasopharyngeal Swab     Status: None   Collection Time: 09/22/19  8:24 PM   Specimen: Nasopharyngeal Swab  Result Value Ref Range Status   SARS Coronavirus 2 NEGATIVE NEGATIVE Final    Comment: (NOTE) SARS-CoV-2 target nucleic acids are NOT DETECTED.  The SARS-CoV-2 RNA is generally detectable in upper and lower respiratory specimens during the acute phase of infection. The lowest concentration of SARS-CoV-2 viral copies this assay can detect is 250 copies / mL. A negative result does not preclude SARS-CoV-2 infection and should not be used as the sole basis for treatment or other patient management decisions.  A negative result may occur with improper specimen collection / handling, submission of specimen other than nasopharyngeal swab, presence of viral mutation(s) within the areas targeted by this assay, and inadequate number of viral copies (<250 copies / mL). A negative result must be combined with clinical observations, patient history, and epidemiological information.  Fact Sheet for Patients:   StrictlyIdeas.no  Fact Sheet for Healthcare Providers: BankingDealers.co.za  This test is not yet approved or  cleared by the [place] FDA and has been authorized for detection and/or diagnosis of SARS-CoV-2 by FDA under an Emergency Use Authorization (EUA).  This EUA will remain in effect  (meaning this test can be used) for the duration of the COVID-19 declaration under Section 564(b)(1) of the Act, 21 U.S.C. section 360bbb-3(b)(1), unless the authorization is terminated or revoked sooner.  Performed at [facility], [address] [place], [place] [address]          Radiology Studies: CT Head Wo Contrast  Result Date: 09/22/2019 CLINICAL DATA:  Syncope EXAM: CT HEAD WITHOUT CONTRAST TECHNIQUE: Contiguous axial images were obtained from the base of the skull through the vertex without intravenous contrast. COMPARISON:  None. FINDINGS: Brain: No acute territorial infarction, hemorrhage or intracranial mass. Mild atrophy. Mild hypodensity in the white matter consistent with chronic small vessel ischemic change. Nonenlarged ventricles. Vascular: No hyperdense vessels. Vertebral and carotid vascular calcification. Skull:  Normal. Negative for fracture or focal lesion. Sinuses/Orbits: No acute finding. Other: None IMPRESSION: 1. No CT evidence for acute intracranial abnormality. 2. Atrophy and mild chronic small vessel ischemic changes of the white matter. Electronically Signed   By: Donavan Foil M.D.   On: 09/22/2019 19:35   CT SHOULDER LEFT WO CONTRAST  Result Date: 09/24/2019 CLINICAL DATA:  Left shoulder pain.  Injury 09/22/2019 EXAM: CT OF THE UPPER LEFT EXTREMITY WITHOUT CONTRAST TECHNIQUE: Multidetector CT imaging of the upper left extremity was performed according to the standard protocol. COMPARISON:  X-ray 09/22/2019 FINDINGS: Bones/Joint/Cartilage Acute minimally displaced fracture of the posterolateral aspect of the left third rib (series 5, image 38 remaining visualized left-sided ribs appear intact. Glenohumeral joint intact without fracture or dislocation. Joint space is maintained. No appreciable glenohumeral joint effusion. Acromioclavicular joint is intact without widening. No significant arthropathy of the left shoulder. No suspicious bone lesion.  Chronic compression fracture of T7 (series 5, image 3). Ligaments Suboptimally assessed by CT. Muscles and Tendons Preserved bulk of the rotator cuff musculature without atrophy or fatty infiltration. Grossly intact rotator cuff tendons within the limitations of CT. Soft tissues Soft tissue swelling and edema over the anterosuperior aspect of the shoulder. No well-defined hematoma. Visualized portion of the left lung field demonstrates dependent basilar atelectasis. No evidence of pneumothorax. Aortic atherosclerosis. IMPRESSION: 1. Acute minimally displaced fracture of the posterolateral aspect of the left third rib. 2. Intact glenohumeral and acromioclavicular joints without fracture or dislocation. 3. Soft tissue swelling and edema over the anterosuperior aspect of the shoulder without discrete hematoma. 4. Chronic compression fracture of T7. Aortic Atherosclerosis (ICD10-I70.0). Electronically Signed   By: Davina Poke D.O.   On: 09/24/2019 12:10   DG Chest Portable 1 View  Result Date: 09/22/2019 CLINICAL DATA:  Syncope. EXAM: PORTABLE CHEST 1 VIEW COMPARISON:  Jul 10, 2016. FINDINGS: The heart size and mediastinal contours are within normal limits. Mild bibasilar subsegmental atelectasis is noted. Stable elevated left hemidiaphragm is noted. No pneumothorax or pleural effusion is noted. The visualized skeletal structures are unremarkable. IMPRESSION: No active disease. Electronically Signed   By: Marijo Conception M.D.   On: 09/22/2019 19:09   DG Shoulder Left  Result Date: 09/22/2019 CLINICAL DATA:  Recent fall with left shoulder pain, initial encounter EXAM: LEFT SHOULDER - 2+ VIEW COMPARISON:  None. FINDINGS: Mild degenerative changes of the acromioclavicular joint are noted. No acute fracture or dislocation is noted in the shoulder joint. The underlying bony thorax shows no acute abnormality. No soft tissue changes are seen. IMPRESSION: No acute abnormality noted. Electronically Signed   By: Inez Catalina M.D.   On: 09/22/2019 19:36   VAS US CAROTID  Result Date: 09/24/2019 Carotid Arterial Duplex Study Indications:      Syncope. Risk Factors:     Hypertension, hyperlipidemia, Diabetes, coronary artery                   disease. Comparison Study: no prior Performing Technologist: Abram Sander RVS  Examination Guidelines: A complete evaluation includes B-mode imaging, spectral Doppler, color Doppler, and power Doppler as needed of all accessible portions of each vessel. Bilateral testing is considered an integral part of a complete examination. Limited examinations for reoccurring indications may be performed as noted.  Right Carotid Findings: +----------+--------+--------+--------+-------------------------+--------+             PSV cm/s EDV cm/s Stenosis Plaque Description        Comments  +----------+--------+--------+--------+-------------------------+--------+  CCA Prox  1        0                 heterogenous                        +----------+--------+--------+--------+-------------------------+--------+  CCA Distal 1        0                 heterogenous                        +----------+--------+--------+--------+-------------------------+--------+  ICA Prox   3        1        60-79%   heterogenous and calcific           +----------+--------+--------+--------+-------------------------+--------+  ICA Mid    1        0                                                     +----------+--------+--------+--------+-------------------------+--------+  ICA Distal 1        0                                                     +----------+--------+--------+--------+-------------------------+--------+  ECA        1                                                              +----------+--------+--------+--------+-------------------------+--------+ +----------+--------+-------+--------+-------------------+             PSV cm/s EDV cms Describe Arm Pressure (mmHG)   +----------+--------+-------+--------+-------------------+  Subclavian 1                                              +----------+--------+-------+--------+-------------------+ +---------+--------+-+--------+-+---------+  Vertebral PSV cm/s 0 EDV cm/s 0 Antegrade  +---------+--------+-+--------+-+---------+  Left Carotid Findings: +----------+--------+--------+--------+------------------+--------+             PSV cm/s EDV cm/s Stenosis Plaque Description Comments  +----------+--------+--------+--------+------------------+--------+  CCA Prox   1        0                 heterogenous                 +----------+--------+--------+--------+------------------+--------+  CCA Distal 1        0                 heterogenous                 +----------+--------+--------+--------+------------------+--------+  ICA Prox   1        0        1-39%    heterogenous                 +----------+--------+--------+--------+------------------+--------+  ICA Distal 1  0                                              +----------+--------+--------+--------+------------------+--------+  ECA        1        0                                              +----------+--------+--------+--------+------------------+--------+ +----------+--------+--------+--------+-------------------+             PSV cm/s EDV cm/s Describe Arm Pressure (mmHG)  +----------+--------+--------+--------+-------------------+  Subclavian 1                                               +----------+--------+--------+--------+-------------------+ +---------+--------+-+--------+-+---------+  Vertebral PSV cm/s 0 EDV cm/s 0 Antegrade  +---------+--------+-+--------+-+---------+   Summary: Right Carotid: Velocities in the right ICA are consistent with a 60-79%                stenosis. Left Carotid: Velocities in the left ICA are consistent with a 1-39% stenosis. Vertebrals: Bilateral vertebral arteries demonstrate antegrade flow. *See table(s) above for measurements and  observations.  Electronically signed by Deitra Mayo MD on 09/24/2019 at 4:19:21 AM.    Final         Scheduled Meds:  albuterol  2.5 mg Nebulization BID   aspirin EC  81 mg Oral Daily   atorvastatin  20 mg Oral Daily   enoxaparin (LOVENOX) injection  40 mg Subcutaneous Q24H   irbesartan  150 mg Oral Daily   ketorolac  15 mg Intravenous Once   potassium chloride  40 mEq Oral BID   Continuous Infusions:   LOS: 2 days        Hosie Poisson, MD [facility]   To contact the attending provider between 7A-7P or the covering provider during after hours 7P-7A, please log into the web site [email or web address] and access using universal [place] password for that web site. If you do not have the password, please call the hospital operator.  09/24/2019, 3:15 PM

## 2019-09-24 NOTE — Progress Notes (Signed)
PT Note  Patient Details Name: Tommy Santiago MRN: [id number] DOB: 04-20-1945      Pt assessment complete. Pt really wanted guidance on Left shoulder and L shoulder assessment. Full eval performed and pt educated with gentle exercises, ice , and progression. Feel pt would benefit from L shoulder sling just for comfort when needing support while up for long periods of time.  Ortho tech brining L shoulder sling. No further f/u needed, no further PT needs. Full write up to follow.   Gatha Mayer, PT, MPT Acute Rehabilitation Services Office: [phone] Pager: [phone] 09/24/2019  Clide Dales 09/24/2019, 2:58 PM

## 2019-09-24 NOTE — Evaluation (Signed)
Physical Therapy Evaluation Patient Details Name: Tommy Santiago MRN: [id number] DOB: 07-Jul-1945 Today's Date: 09/24/2019   History of Present Illness  pt with fall after out in his yard for over 1 hour found to be hyponatremia, and CT of L shoulder reveals mild 3rd rib fracture and shoulder intact.  Clinical Impression  Pt wanted PT to assess his shoulder to educate on what his limitations or progression should be. PEr CT , no acute fractures of the shoulder and RTC per CT appears to be intact. Some swelling noted and 3rd rib. Assessed pt pain with movement, and pt reports that his shoulder is slowly feeling a little better today.   Able to perform guarded but slow L shoulder elevation, most pain in horizontal adduction, but not a lot, negative impingement test, and no obvious rotator cuff damage at this point. Educated to limit movement to pain free range, avoid direct over head full shoulder elevation intiially because this will also pull on the 3rd rib, as pain noted in exam. Ice, pendulum educated and demonstrated back by patient.  Also shoulder elevation in sagittal plan with scapular stabilized against wall, and/or while lying supine in bed to horizontal level.   Also recommend basic L arm sling only for comfort while up for prolonged periods of time, ot to be worn for long periods, specified mobility is important too. Pt understood   All education complete    Follow Up Recommendations No PT follow up    Equipment Recommendations  None recommended by PT    Recommendations for Other Services       Precautions / Restrictions Precautions Precautions: Fall      Mobility  Bed Mobility               General bed mobility comments: did not test, pt stated difficult due to sore L shoulder and rib area but able to slowly manage bed mobility independnetly today in room  Transfers Overall transfer level: Independent Equipment used: None                 Ambulation/Gait Ambulation/Gait assistance: Independent Gait Distance (Feet): 30 Feet Assistive device: None Gait Pattern/deviations: Step-through pattern     General Gait Details: normal gait patterns in room, no difficulty with turning, denies dizziness, etc. reveiwed pendulum exercises in standing while up on feet. . No further gait assessment necesry per patient  Stairs            Wheelchair Mobility    Modified Rankin (Stroke Patients Only)       Balance Overall balance assessment: Independent;No apparent balance deficits (not formally assessed)                                           Pertinent Vitals/Pain Pain Assessment: 0-10 Pain Score: 4  Pain Location: mild pain L atrior chest are ( around 3rd rib area) and superior aspect of shoulder with full elevation and abduction Pain Descriptors / Indicators: Aching;Sore Pain Intervention(s): Monitored during session    Home Living Family/patient expects to be discharged to:: Private residence Living Arrangements: Spouse/significant other Available Help at Discharge: Family Type of Home: House Home Access: Stairs to enter   Technical brewer of Steps: 1 Home Layout: One level        Prior Function Level of Independence: Independent         Comments: pt reports  very independent , enjoys being active , yoga, and outside activities     Hand Dominance        Extremity/Trunk Assessment        Lower Extremity Assessment Lower Extremity Assessment: Overall WFL for tasks assessed       Communication   Communication: No difficulties  Cognition Arousal/Alertness: Awake/alert Behavior During Therapy: WFL for tasks assessed/performed Overall Cognitive Status: Within Functional Limits for tasks assessed                                        General Comments      Exercises Other Exercises Other Exercises: see clinical note at top of note    Assessment/Plan    PT Assessment Patent does not need any further PT services  PT Problem List         PT Treatment Interventions      PT Goals (Current goals can be found in the Care Plan section)  Acute Rehab PT Goals PT Goal Formulation: All assessment and education complete, DC therapy    Frequency     Barriers to discharge        Co-evaluation               AM-PAC PT "6 Clicks" Mobility  Outcome Measure Help needed turning from your back to your side while in a flat bed without using bedrails?: None Help needed moving from lying on your back to sitting on the side of a flat bed without using bedrails?: None Help needed moving to and from a bed to a chair (including a wheelchair)?: None Help needed standing up from a chair using your arms (e.g., wheelchair or bedside chair)?: None Help needed to walk in hospital room?: None Help needed climbing 3-5 steps with a railing? : None 6 Click Score: 24    End of Session   Activity Tolerance: Patient tolerated treatment well Patient left: in chair   PT Visit Diagnosis: Pain Pain - Right/Left: Left Pain - part of body: Shoulder    Time: 1425-1446 PT Time Calculation (min) (ACUTE ONLY): 21 min   Charges:   PT Evaluation $PT Eval Low Complexity: 1 Low          Saia Derossett, PT, MPT Acute Rehabilitation Services Office: [phone] Pager: [phone] 09/24/2019   Clide Dales 09/24/2019, 5:38 PM

## 2019-09-24 NOTE — Progress Notes (Signed)
Orthopedic Tech Progress Note Patient Details:  Tommy Santiago May 04, 1945 [id number]  Ortho Devices Type of Ortho Device: Arm sling Ortho Device/Splint Location: LUE Ortho Device/Splint Interventions: Ordered, Application   Post Interventions Patient Tolerated: Well Instructions Provided: Care of device   Braulio Bosch 09/24/2019, 3:03 PM

## 2019-09-25 LAB — BASIC METABOLIC PANEL
Anion gap: 8 (ref 5–15)
BUN: 14 mg/dL (ref 8–23)
CO2: 25 mmol/L (ref 22–32)
Calcium: 9.2 mg/dL (ref 8.9–10.3)
Chloride: 99 mmol/L (ref 98–111)
Creatinine, Ser: 0.8 mg/dL (ref 0.61–1.24)
GFR calc Af Amer: >60 mL/min (ref >60)
GFR calc non Af Amer: >60 mL/min (ref >60)
Glucose, Bld: 119 mg/dL — ABNORMAL HIGH (ref 70–99)
Potassium: 4.5 mmol/L (ref 3.5–5.1)
Sodium: 132 mmol/L — ABNORMAL LOW (ref 135–145)

## 2019-09-25 MED ORDER — SENNOSIDES-DOCUSATE SODIUM 8.6-50 MG PO TABS
1.0000 | ORAL_TABLET | Freq: Two times a day (BID) | ORAL | Status: DC
  Administered 2019-09-25: 1 via ORAL
  Filled 2019-09-25: qty 1

## 2019-09-25 MED ORDER — TRAMADOL HCL 50 MG PO TABS: 50.0000 mg | ORAL_TABLET | Freq: Four times a day (QID) | ORAL | 0 refills | Status: AC | PRN

## 2019-09-25 MED ORDER — POLYETHYLENE GLYCOL 3350 17 G PO PACK
17.0000 g | PACK | Freq: Every day | ORAL | Status: DC
  Administered 2019-09-25: 17 g via ORAL
  Filled 2019-09-25: qty 1

## 2019-09-25 MED ORDER — SIMETHICONE 40 MG/0.6ML PO SUSP
40.0000 mg | Freq: Once | ORAL | Status: DC
  Filled 2019-09-25: qty 0.6

## 2019-09-25 NOTE — Progress Notes (Signed)
Patient's IV removed.  Site WNL.  AVS reviewed with patient.  Verbalized understanding of discharge instructions, physician follow-up, medications.  Patient transported by NT via wheelchair to main entrance at discharge.  Patient stable at time of discharge. 

## 2019-09-26 NOTE — Discharge Summary (Signed)
Physician Discharge Summary  Tommy Santiago LGX:[id number] DOB: May 01, 1945 DOA: 09/22/2019  PCP: Bernerd Limbo, MD  Admit date: 09/22/2019 Discharge date: 09/25/2019  Admitted From: Home.  Disposition:  Home.   Recommendations for Outpatient Follow-up:  1. Follow up with PCP in 1-2 weeks 2. Please obtain BMP/CBC in one week Please follow up with vascular surgery as outpatient.   Discharge Condition:STABLE. CODE STATUS:FULL CODE.  Diet recommendation: Heart Healthy  Brief/Interim Summary: 74 year old gentleman with prior history of hypertension coronary artery disease, paroxysmal SVT, obstructive sleep apnea on CPAP, hyperlipidemia, prediabetes presents to med [facility] for her syncope and hyponatremia. On arrival to ED he was found to have hyponatremia. He was referred to [facility] for admission . He was stared on gentle hydration and repeat sodium improved to 131.  Pt reports worsening left shoulder pain, and unable to raise his left arm beyond the shoulder.  CT of the left shoulder ordered for further evaluation.  Discharge Diagnoses:  Principal Problem:   Syncope Active Problems:   Hyperlipidemia   Essential hypertension   CAD, NATIVE VESSEL   Obesity (BMI 30-39.9)   Hyponatremia   OSA on CPAP   Alcohol use   GERD (gastroesophageal reflux disease)   Syncope: Probably from dehydration and polysubstance abuse .  Pt fell on his left shoulder.  Pt reports worsening pain and unable to lift his arm beyond the shoulder.  CT shoulder ordered for further evaluation showed Acute minimally displaced fracture of the posterolateral aspect of the left third rib. Intact glenohumeral and acromioclavicular joints without fracture or dislocation. Soft tissue swelling and edema over the anterosuperior aspect of the shoulder without discrete hematoma.  Body mass index is 30.96 kg/m.   CAD: Pt denies any chest pain.    Hyponatremia: Probably sec to dehydration.   Improving with IV fluids.  Sodium is 132 today.    OSA on CPAP.    GERD  Stable.    Essential hypertension:  Better controlled.    Discharge Instructions  Discharge Instructions    Diet - low sodium heart healthy   Complete by: As directed    Discharge instructions   Complete by: As directed    PLEASE follow up with PCP in one week.     Allergies as of 09/25/2019      Reactions   Amlodipine Swelling   Ankle swelling      Medication List    STOP taking these medications   doxycycline 100 MG capsule Commonly known as: VIBRAMYCIN   hydrochlorothiazide 25 MG tablet Commonly known as: HYDRODIURIL     TAKE these medications   albuterol (2.5 MG/3ML) 0.083% nebulizer solution Commonly known as: PROVENTIL Take 3 mLs (2.5 mg total) by nebulization every 6 (six) hours as needed for wheezing or shortness of breath.   aspirin EC 81 MG tablet Take 1 tablet (81 mg total) by mouth daily.   atorvastatin 20 MG tablet Commonly known as: LIPITOR TAKE 1 TABLET BY MOUTH DAILY.   Calcium 600/Vitamin D 600-400 MG-UNIT tablet Generic drug: Calcium Carbonate-Vitamin D Take 1 tablet by mouth 2 (two) times daily.   Cialis 20 MG tablet Generic drug: tadalafil Take 20 mg by mouth daily as needed. Patient uses this medication for ED.   dextromethorphan-guaiFENesin 10-100 MG/5ML liquid Commonly known as: ROBITUSSIN-DM Take 10 mLs by mouth every 6 (six) hours as needed for cough.   econazole nitrate 1 % cream Apply 1 application topically daily as needed (dry).   famotidine 20  MG tablet Commonly known as: PEPCID Take 20 mg by mouth 2 (two) times daily as needed for heartburn.   Flutter Devi Use as directed   melatonin 5 MG Tabs Take 5 mg by mouth as needed (sleep).   MISC NATURAL PRODUCTS PO Take 20 mg by mouth daily as needed (sleep). CBD Gummy, uses for anxiety and sleep   nitroGLYCERIN 0.4 MG SL tablet Commonly known as: NITROSTAT Place 1 tablet (0.4 mg  total) under the tongue every 5 (five) minutes as needed for chest pain.   olmesartan 20 MG tablet Commonly known as: BENICAR Take 20 mg by mouth daily.   psyllium 58.6 % powder Commonly known as: METAMUCIL Take 1 packet by mouth 2 (two) times a day. Patient uses this mediation for regularity.   sodium chloride HYPERTONIC 3 % nebulizer solution Take by nebulization in the morning and at bedtime.   traMADol 50 MG tablet Commonly known as: ULTRAM Take 1 tablet (50 mg total) by mouth every 6 (six) hours as needed for up to 5 days for moderate pain.   VITAMIN B COMPLEX PO Take 1 tablet by mouth daily.   VITAMIN D (CHOLECALCIFEROL) PO Take 1 tablet by mouth daily.   VITAMIN E PO Take 1 tablet by mouth daily.       Allergies  Allergen Reactions  . Amlodipine Swelling    Ankle swelling    Consultations:  None.    Procedures/Studies: CT Head Wo Contrast  Result Date: 09/22/2019 CLINICAL DATA:  Syncope EXAM: CT HEAD WITHOUT CONTRAST TECHNIQUE: Contiguous axial images were obtained from the base of the skull through the vertex without intravenous contrast. COMPARISON:  None. FINDINGS: Brain: No acute territorial infarction, hemorrhage or intracranial mass. Mild atrophy. Mild hypodensity in the white matter consistent with chronic small vessel ischemic change. Nonenlarged ventricles. Vascular: No hyperdense vessels. Vertebral and carotid vascular calcification. Skull: Normal. Negative for fracture or focal lesion. Sinuses/Orbits: No acute finding. Other: None IMPRESSION: 1. No CT evidence for acute intracranial abnormality. 2. Atrophy and mild chronic small vessel ischemic changes of the white matter. Electronically Signed   By: Donavan Foil M.D.   On: 09/22/2019 19:35   CT SHOULDER LEFT WO CONTRAST  Result Date: 09/24/2019 CLINICAL DATA:  Left shoulder pain.  Injury 09/22/2019 EXAM: CT OF THE UPPER LEFT EXTREMITY WITHOUT CONTRAST TECHNIQUE: Multidetector CT imaging of the upper  left extremity was performed according to the standard protocol. COMPARISON:  X-ray 09/22/2019 FINDINGS: Bones/Joint/Cartilage Acute minimally displaced fracture of the posterolateral aspect of the left third rib (series 5, image 38 remaining visualized left-sided ribs appear intact. Glenohumeral joint intact without fracture or dislocation. Joint space is maintained. No appreciable glenohumeral joint effusion. Acromioclavicular joint is intact without widening. No significant arthropathy of the left shoulder. No suspicious bone lesion. Chronic compression fracture of T7 (series 5, image 3). Ligaments Suboptimally assessed by CT. Muscles and Tendons Preserved bulk of the rotator cuff musculature without atrophy or fatty infiltration. Grossly intact rotator cuff tendons within the limitations of CT. Soft tissues Soft tissue swelling and edema over the anterosuperior aspect of the shoulder. No well-defined hematoma. Visualized portion of the left lung field demonstrates dependent basilar atelectasis. No evidence of pneumothorax. Aortic atherosclerosis. IMPRESSION: 1. Acute minimally displaced fracture of the posterolateral aspect of the left third rib. 2. Intact glenohumeral and acromioclavicular joints without fracture or dislocation. 3. Soft tissue swelling and edema over the anterosuperior aspect of the shoulder without discrete hematoma. 4. Chronic compression fracture of T7.  Aortic Atherosclerosis (ICD10-I70.0). Electronically Signed   By: Davina Poke D.O.   On: 09/24/2019 12:10   DG Chest Portable 1 View  Result Date: 09/22/2019 CLINICAL DATA:  Syncope. EXAM: PORTABLE CHEST 1 VIEW COMPARISON:  Jul 10, 2016. FINDINGS: The heart size and mediastinal contours are within normal limits. Mild bibasilar subsegmental atelectasis is noted. Stable elevated left hemidiaphragm is noted. No pneumothorax or pleural effusion is noted. The visualized skeletal structures are unremarkable. IMPRESSION: No active disease.  Electronically Signed   By: Marijo Conception M.D.   On: 09/22/2019 19:09   DG Shoulder Left  Result Date: 09/22/2019 CLINICAL DATA:  Recent fall with left shoulder pain, initial encounter EXAM: LEFT SHOULDER - 2+ VIEW COMPARISON:  None. FINDINGS: Mild degenerative changes of the acromioclavicular joint are noted. No acute fracture or dislocation is noted in the shoulder joint. The underlying bony thorax shows no acute abnormality. No soft tissue changes are seen. IMPRESSION: No acute abnormality noted. Electronically Signed   By: Inez Catalina M.D.   On: 09/22/2019 19:36   VAS US CAROTID  Result Date: 09/24/2019 Carotid Arterial Duplex Study Indications:      Syncope. Risk Factors:     Hypertension, hyperlipidemia, Diabetes, coronary artery                   disease. Comparison Study: no prior Performing Technologist: Abram Sander RVS  Examination Guidelines: A complete evaluation includes B-mode imaging, spectral Doppler, color Doppler, and power Doppler as needed of all accessible portions of each vessel. Bilateral testing is considered an integral part of a complete examination. Limited examinations for reoccurring indications may be performed as noted.  Right Carotid Findings: +----------+--------+--------+--------+-------------------------+--------+           PSV cm/sEDV cm/sStenosisPlaque Description       Comments +----------+--------+--------+--------+-------------------------+--------+ CCA Prox  1       0               heterogenous                      +----------+--------+--------+--------+-------------------------+--------+ CCA Distal1       0               heterogenous                      +----------+--------+--------+--------+-------------------------+--------+ ICA Prox  3       1       60-79%  heterogenous and calcific         +----------+--------+--------+--------+-------------------------+--------+ ICA Mid   1       0                                                  +----------+--------+--------+--------+-------------------------+--------+ ICA Distal1       0                                                 +----------+--------+--------+--------+-------------------------+--------+ ECA       1                                                         +----------+--------+--------+--------+-------------------------+--------+ +----------+--------+-------+--------+-------------------+  PSV cm/sEDV cmsDescribeArm Pressure (mmHG) +----------+--------+-------+--------+-------------------+ Subclavian1                                          +----------+--------+-------+--------+-------------------+ +---------+--------+-+--------+-+---------+ VertebralPSV cm/s0EDV cm/s0Antegrade +---------+--------+-+--------+-+---------+  Left Carotid Findings: +----------+--------+--------+--------+------------------+--------+           PSV cm/sEDV cm/sStenosisPlaque DescriptionComments +----------+--------+--------+--------+------------------+--------+ CCA Prox  1       0               heterogenous               +----------+--------+--------+--------+------------------+--------+ CCA Distal1       0               heterogenous               +----------+--------+--------+--------+------------------+--------+ ICA Prox  1       0       1-39%   heterogenous               +----------+--------+--------+--------+------------------+--------+ ICA Distal1       0                                          +----------+--------+--------+--------+------------------+--------+ ECA       1       0                                          +----------+--------+--------+--------+------------------+--------+ +----------+--------+--------+--------+-------------------+           PSV cm/sEDV cm/sDescribeArm Pressure (mmHG) +----------+--------+--------+--------+-------------------+ [id number]                                            +----------+--------+--------+--------+-------------------+ +---------+--------+-+--------+-+---------+ VertebralPSV cm/s0EDV cm/s0Antegrade +---------+--------+-+--------+-+---------+   Summary: Right Carotid: Velocities in the right ICA are consistent with a 60-79%                stenosis. Left Carotid: Velocities in the left ICA are consistent with a 1-39% stenosis. Vertebrals: Bilateral vertebral arteries demonstrate antegrade flow. *See table(s) above for measurements and observations.  Electronically signed by Deitra Mayo MD on 09/24/2019 at 4:19:21 AM.    Final        Subjective: Reports being constipated.   Discharge Exam: Vitals:   09/25/19 1037 09/25/19 1315  BP:  (!) 143/66  Pulse:  64  Resp:  18  Temp:  97.6 F (36.4 C)  SpO2: 96% 98%   Vitals:   09/24/19 2102 09/25/19 0532 09/25/19 1037 09/25/19 1315  BP:  (!) 115/64  (!) 143/66  Pulse:  50  64  Resp:  16  18  Temp:  98.2 F (36.8 C)  97.6 F (36.4 C)  TempSrc:  Oral  Oral  SpO2: 98% 95% 96% 98%  Weight:      Height:        General: Pt is alert, awake, not in acute distress Cardiovascular: RRR, S1/S2 +, no rubs, no gallops Respiratory: CTA bilaterally, no wheezing, no rhonchi Abdominal: Soft, NT, ND, bowel sounds + Extremities: no edema, no cyanosis    The results of significant  diagnostics from this hospitalization (including imaging, microbiology, ancillary and laboratory) are listed below for reference.     Microbiology: Recent Results (from the past 240 hour(s))  SARS Coronavirus 2 by RT PCR (hospital order, performed in [facility] hospital lab) Nasopharyngeal Nasopharyngeal Swab     Status: None   Collection Time: 09/22/19  8:24 PM   Specimen: Nasopharyngeal Swab  Result Value Ref Range Status   SARS Coronavirus 2 NEGATIVE NEGATIVE Final    Comment: (NOTE) SARS-CoV-2 target nucleic acids are NOT DETECTED.  The SARS-CoV-2 RNA is generally detectable in upper and  lower respiratory specimens during the acute phase of infection. The lowest concentration of SARS-CoV-2 viral copies this assay can detect is 250 copies / mL. A negative result does not preclude SARS-CoV-2 infection and should not be used as the sole basis for treatment or other patient management decisions.  A negative result may occur with improper specimen collection / handling, submission of specimen other than nasopharyngeal swab, presence of viral mutation(s) within the areas targeted by this assay, and inadequate number of viral copies (<250 copies / mL). A negative result must be combined with clinical observations, patient history, and epidemiological information.  Fact Sheet for Patients:   StrictlyIdeas.no  Fact Sheet for Healthcare Providers: BankingDealers.co.za  This test is not yet approved or  cleared by the [place] FDA and has been authorized for detection and/or diagnosis of SARS-CoV-2 by FDA under an Emergency Use Authorization (EUA).  This EUA will remain in effect (meaning this test can be used) for the duration of the COVID-19 declaration under Section 564(b)(1) of the Act, 21 U.S.C. section 360bbb-3(b)(1), unless the authorization is terminated or revoked sooner.  Performed at [facility], [address] [place], [place] [address]      Labs: BNP (last 3 results) No results for input(s): BNP in the last 8760 hours. Basic Metabolic Panel: Recent Labs  Lab 09/22/19 1842 09/23/19 0045 09/23/19 0547 09/24/19 0832 09/25/19 1112  NA 121* 122* 123* 131* 132*  K 3.4* 3.1* 3.7 4.2 4.5  CL 89* 91* 94* 100 99  CO2 20* 21* 21* 20* 25  GLUCOSE 133* 110* 105* 117* 119*  BUN 15 12 10 10 14   CREATININE 0.87 0.74 0.65 0.74 0.80  CALCIUM 8.6* 8.2* 8.2* 9.1 9.2   Liver Function Tests: Recent Labs  Lab 09/22/19 1842  AST 23  ALT 24  ALKPHOS 53  BILITOT 0.9  PROT 7.0  ALBUMIN 4.4   No  results for input(s): LIPASE, AMYLASE in the last 168 hours. No results for input(s): AMMONIA in the last 168 hours. CBC: Recent Labs  Lab 09/22/19 1842 09/23/19 0547  WBC 14.8* 8.2  HGB 14.5 13.5  HCT 41.7 38.2*  MCV 97.2 97.4  PLT 261 232   Cardiac Enzymes: No results for input(s): CKTOTAL, CKMB, CKMBINDEX, TROPONINI in the last 168 hours. BNP: Invalid input(s): POCBNP CBG: Recent Labs  Lab 09/22/19 1848  GLUCAP 121*   D-Dimer No results for input(s): DDIMER in the last 72 hours. Hgb A1c No results for input(s): HGBA1C in the last 72 hours. Lipid Profile No results for input(s): CHOL, HDL, LDLCALC, TRIG, CHOLHDL, LDLDIRECT in the last 72 hours. Thyroid function studies No results for input(s): TSH, T4TOTAL, T3FREE, THYROIDAB in the last 72 hours.  Invalid input(s): FREET3 Anemia work up No results for input(s): VITAMINB12, FOLATE, FERRITIN, TIBC, IRON, RETICCTPCT in the last 72 hours. Urinalysis    Component Value Date/Time  [place] YELLOW 09/22/2019 2024   APPEARANCEUR CLEAR 09/22/2019 2024   LABSPEC <1.005 (L) 09/22/2019 2024   PHURINE 6.0 09/22/2019 2024   GLUCOSEU NEGATIVE 09/22/2019 2024   HGBUR NEGATIVE 09/22/2019 2024   BILIRUBINUR NEGATIVE 09/22/2019 2024   KETONESUR 15 (A) 09/22/2019 2024   PROTEINUR NEGATIVE 09/22/2019 2024   UROBILINOGEN 1.0 06/23/2008 0813   NITRITE NEGATIVE 09/22/2019 2024   LEUKOCYTESUR NEGATIVE 09/22/2019 2024   Sepsis Labs Invalid input(s): PROCALCITONIN,  WBC,  LACTICIDVEN Microbiology Recent Results (from the past 240 hour(s))  SARS Coronavirus 2 by RT PCR (hospital order, performed in [place] hospital lab) Nasopharyngeal Nasopharyngeal Swab     Status: None   Collection Time: 09/22/19  8:24 PM   Specimen: Nasopharyngeal Swab  Result Value Ref Range Status   SARS Coronavirus 2 NEGATIVE NEGATIVE Final    Comment: (NOTE) SARS-CoV-2 target nucleic acids are NOT DETECTED.  The SARS-CoV-2 RNA is generally  detectable in upper and lower respiratory specimens during the acute phase of infection. The lowest concentration of SARS-CoV-2 viral copies this assay can detect is 250 copies / mL. A negative result does not preclude SARS-CoV-2 infection and should not be used as the sole basis for treatment or other patient management decisions.  A negative result may occur with improper specimen collection / handling, submission of specimen other than nasopharyngeal swab, presence of viral mutation(s) within the areas targeted by this assay, and inadequate number of viral copies (<250 copies / mL). A negative result must be combined with clinical observations, patient history, and epidemiological information.  Fact Sheet for Patients:   StrictlyIdeas.no  Fact Sheet for Healthcare Providers: BankingDealers.co.za  This test is not yet approved or  cleared by the [place] FDA and has been authorized for detection and/or diagnosis of SARS-CoV-2 by FDA under an Emergency Use Authorization (EUA).  This EUA will remain in effect (meaning this test can be used) for the duration of the COVID-19 declaration under Section 564(b)(1) of the Act, 21 U.S.C. section 360bbb-3(b)(1), unless the authorization is terminated or revoked sooner.  Performed at [facility], [address], [place], [place] [address]      Time coordinating discharge: 32 minutes.   SIGNED:   Hosie Poisson, MD  [facility]

## 2019-10-18 DIAGNOSIS — Z0000 Encounter for general adult medical examination without abnormal findings: Secondary | ICD-10-CM | POA: Diagnosis not present

## 2019-10-18 DIAGNOSIS — I6521 Occlusion and stenosis of right carotid artery: Secondary | ICD-10-CM | POA: Diagnosis not present

## 2019-10-18 DIAGNOSIS — L439 Lichen planus, unspecified: Secondary | ICD-10-CM | POA: Diagnosis not present

## 2019-10-18 MED FILL — FLUTICASONE PROP 0.005% OIN: 0.005 | 7 days supply | Qty: 15 | Fill #0

## 2019-11-28 MED FILL — OLMESARTAN MEDOXOMIL 20 MG: 20 | 90 days supply | Qty: 90 | Fill #1

## 2019-11-28 MED FILL — ATORVASTATIN CALCIUM 20 MG: 20 | 90 days supply | Qty: 90 | Fill #1

## 2019-11-29 DIAGNOSIS — G4733 Obstructive sleep apnea (adult) (pediatric): Secondary | ICD-10-CM | POA: Diagnosis not present

## 2019-12-08 DIAGNOSIS — L821 Other seborrheic keratosis: Secondary | ICD-10-CM | POA: Diagnosis not present

## 2019-12-08 DIAGNOSIS — D229 Melanocytic nevi, unspecified: Secondary | ICD-10-CM | POA: Diagnosis not present

## 2019-12-08 DIAGNOSIS — L814 Other melanin hyperpigmentation: Secondary | ICD-10-CM | POA: Diagnosis not present

## 2019-12-08 DIAGNOSIS — L570 Actinic keratosis: Secondary | ICD-10-CM | POA: Diagnosis not present

## 2019-12-21 DIAGNOSIS — K7469 Other cirrhosis of liver: Secondary | ICD-10-CM | POA: Diagnosis not present

## 2019-12-30 ENCOUNTER — Other Ambulatory Visit: Payer: Self-pay

## 2019-12-30 DIAGNOSIS — I6529 Occlusion and stenosis of unspecified carotid artery: Secondary | ICD-10-CM

## 2020-01-02 MED FILL — FAMOTIDINE 20 MG TABS: 20 | 90 days supply | Qty: 180 | Fill #1

## 2020-01-09 ENCOUNTER — Ambulatory Visit: Payer: PPO | Admitting: Surgery

## 2020-01-09 ENCOUNTER — Other Ambulatory Visit: Payer: Self-pay

## 2020-01-09 ENCOUNTER — Encounter: Payer: Self-pay | Admitting: Surgery

## 2020-01-09 ENCOUNTER — Ambulatory Visit (HOSPITAL_COMMUNITY)
Admission: RE | Admit: 2020-01-09 | Discharge: 2020-01-09 | Disposition: A | Discharge status: Home / Self Care | Payer: PPO | Source: Ambulatory Visit | Attending: Surgery | Admitting: Surgery

## 2020-01-09 VITALS — BP 146/78 | HR 58 | Temp 98.2°F | Ht 71.0 in | Wt 227.0 lb

## 2020-01-09 DIAGNOSIS — I6529 Occlusion and stenosis of unspecified carotid artery: Secondary | ICD-10-CM | POA: Diagnosis not present

## 2020-01-09 DIAGNOSIS — I6521 Occlusion and stenosis of right carotid artery: Secondary | ICD-10-CM

## 2020-01-09 NOTE — Progress Notes (Signed)
[facility] [place]  Patient name: Tommy Santiago MRN: [id number] DOB: 1945-07-03 Sex: male   REQUESTING PROVIDER:    Bernerd Limbo   REASON FOR CONSULT:    Carotid stenosis  HISTORY OF PRESENT ILLNESS:   Tommy Santiago is a 74 y.o. male, who is referred for evaluation of right carotid stenosis.  This was detected back in August 2021 following a syncopal episode.  The episode was thought to be secondary to dehydration.  He denies any symptoms.  Specifically he denies numbness or weakness in either extremity.  He denies slurred speech.  He denies amaurosis fugax.  Patient has a history of hepatitis C which has been treated medically and is in remission.  He is on a statin for hypercholesterolemia.  He is medically managed for hypertension with an ARB.  He has a history of coronary disease status post PCI in 2010.  PAST MEDICAL HISTORY    Past Medical History:  Diagnosis Date  . Bronchiectasis ([place])   . Bronchiectasis ([place])   . CAP (community acquired pneumonia)   . Carotid artery occlusion   . Coronary atherosclerosis of unspecified type of vessel, native or graft    status post cardiac cath w percutaneous coronary intervention using XIENCE drug-eluting stent to mid left anterior descending 07/23/08; LHC 08/07/11: Mid LAD stent patent, moderate size diagonal branch jailed by stent with 70% ostial stenosis, unchanged from 2010 with excellent flow down the diagonal branch, mid circumflex 30%, proximal and mid RCA 20%, EF 60%.  Medical therapy continued.   Marland Kitchen GERD (gastroesophageal reflux disease)   . Hepatitis C    Treated [facility]  . Hyperlipemia   . Hypertension   . Hypopotassemia   . Lichen planus   . Other and unspecified hyperlipidemia   . Other specified cardiac dysrhythmias(427.89)   . Paroxysmal supraventricular tachycardia ([place])   . Personal history of other infectious and parasitic disease   . Plantar fascial  fibromatosis   . Pneumonia   . Syncope and collapse      FAMILY HISTORY   Family History  Problem Relation Age of Onset  . Heart disease Father   . Coronary artery disease Brother        diagnosed in early 5s  . Rectal cancer Brother   . Coronary artery disease Other        hx family    SOCIAL HISTORY:   Social History   Socioeconomic History  . Marital status: Married    Spouse name: Not on file  . Number of children: Not on file  . Years of education: Not on file  . Highest education level: Not on file  Occupational History  . Occupation: Critical care RN  Tobacco Use  . Smoking status: Former Smoker    Packs/day: 2.00    Years: 25.00    Pack years: 50.00    Quit date: 1989    Years since quitting: 32.8  . Smokeless tobacco: Never Used  Vaping Use  . Vaping Use: Never used  Substance and Sexual Activity  . Alcohol use: Yes    Alcohol/week: 14.0 standard drinks    Types: 14 Standard drinks or equivalent per week    Comment: social  . Drug use: No    Comment: quit 25 years ago  . Sexual activity: Not on file  Other Topics Concern  . Not on file  Social History Narrative  . Not on file   Social Determinants of Health  Financial Resource Strain:   . Difficulty of Paying Living Expenses: Not on file  Food Insecurity:   . Worried About Charity fundraiser in the Last Year: Not on file  . Ran Out of Food in the Last Year: Not on file  Transportation Needs:   . Lack of Transportation (Medical): Not on file  . Lack of Transportation (Non-Medical): Not on file  Physical Activity:   . Days of Exercise per Week: Not on file  . Minutes of Exercise per Session: Not on file  Stress:   . Feeling of Stress : Not on file  Social Connections:   . Frequency of Communication with Friends and Family: Not on file  . Frequency of Social Gatherings with Friends and Family: Not on file  . Attends Religious Services: Not on file  . Active Member of Clubs or  Organizations: Not on file  . Attends Archivist Meetings: Not on file  . Marital Status: Not on file  Intimate Partner Violence:   . Fear of Current or Ex-Partner: Not on file  . Emotionally Abused: Not on file  . Physically Abused: Not on file  . Sexually Abused: Not on file    ALLERGIES:    Allergies  Allergen Reactions  . Amlodipine Swelling    Ankle swelling    CURRENT MEDICATIONS:    Current Outpatient Medications  Medication Sig Dispense Refill  . albuterol (PROVENTIL) (2.5 MG/3ML) 0.083% nebulizer solution Take 3 mLs (2.5 mg total) by nebulization every 6 (six) hours as needed for wheezing or shortness of breath. 300 mL 10  . aspirin EC 81 MG tablet Take 1 tablet (81 mg total) by mouth daily.    Marland Kitchen atorvastatin (LIPITOR) 20 MG tablet TAKE 1 TABLET BY MOUTH DAILY. (Patient taking differently: Take 20 mg by mouth daily. ) 90 tablet 3  . B Complex Vitamins (VITAMIN B COMPLEX PO) Take 1 tablet by mouth daily.     . Calcium Carbonate-Vitamin D (CALCIUM 600/VITAMIN D) 600-400 MG-UNIT per tablet Take 1 tablet by mouth 2 (two) times daily.      Marland Kitchen dextromethorphan-guaiFENesin (ROBITUSSIN-DM) 10-100 MG/5ML liquid Take 10 mLs by mouth every 6 (six) hours as needed for cough.    . famotidine (PEPCID) 20 MG tablet Take 20 mg by mouth 2 (two) times daily as needed for heartburn.     . Melatonin 5 MG TABS Take 5 mg by mouth as needed (sleep).     Marland Kitchen MISC NATURAL PRODUCTS PO Take 20 mg by mouth daily as needed (sleep). CBD Gummy, uses for anxiety and sleep     . nitroGLYCERIN (NITROSTAT) 0.4 MG SL tablet Place 1 tablet (0.4 mg total) under the tongue every 5 (five) minutes as needed for chest pain. 25 tablet 6  . olmesartan (BENICAR) 20 MG tablet Take 20 mg by mouth daily.     . psyllium (METAMUCIL) 58.6 % powder Take 1 packet by mouth 2 (two) times a day. Patient uses this mediation for regularity.    Marland Kitchen Respiratory Therapy Supplies (FLUTTER) DEVI Use as directed 1 each 0  .  sodium chloride HYPERTONIC 3 % nebulizer solution Take by nebulization in the morning and at bedtime. 750 mL 10  . tadalafil (CIALIS) 20 MG tablet Take 20 mg by mouth daily as needed. Patient uses this medication for ED.    Marland Kitchen VITAMIN D, CHOLECALCIFEROL, PO Take 1 tablet by mouth daily.    Marland Kitchen VITAMIN E PO Take 1 tablet by mouth  daily.     No current facility-administered medications for this visit.    REVIEW OF SYSTEMS:   [X]  denotes positive finding, [ ]  denotes negative finding Cardiac  Comments:  Chest pain or chest pressure:    Shortness of breath upon exertion:    Short of breath when lying flat:    Irregular heart rhythm:        Vascular    Pain in calf, thigh, or hip brought on by ambulation:    Pain in feet at night that wakes you up from your sleep:     Blood clot in your veins:    Leg swelling:         Pulmonary    Oxygen at home:    Productive cough:     Wheezing:         Neurologic    Sudden weakness in arms or legs:     Sudden numbness in arms or legs:     Sudden onset of difficulty speaking or slurred speech:    Temporary loss of vision in one eye:     Problems with dizziness:         Gastrointestinal    Blood in stool:      Vomited blood:         Genitourinary    Burning when urinating:     Blood in urine:        Psychiatric    Major depression:         Hematologic    Bleeding problems:    Problems with blood clotting too easily:        Skin    Rashes or ulcers:        Constitutional    Fever or chills:     PHYSICAL EXAM:   Vitals:   01/09/20 1146 01/09/20 1147  BP: (!) 145/87 (!) 146/78  Pulse: (!) 58   Temp: 98.2 F (36.8 C)   SpO2: 96%   Weight: 227 lb (103 kg)   Height: 5\' 11"  (1.803 m)     GENERAL: The patient is a well-nourished male, in no acute distress. The vital signs are documented above. CARDIAC: There is a regular rate and rhythm.  VASCULAR: Palpable posterior tibial pulse bilaterally PULMONARY: Nonlabored respirations   MUSCULOSKELETAL: There are no major deformities or cyanosis. NEUROLOGIC: No focal weakness or paresthesias are detected. SKIN: There are no ulcers or rashes noted. PSYCHIATRIC: The patient has a normal affect.  STUDIES:   I have reviewed the following: Right Carotid: Velocities in the right ICA are consistent with a 60-79%         stenosis.   Left Carotid: Velocities in the left ICA are consistent with a 1-39%  stenosis.   ASSESSMENT and PLAN   Asymptomatic right carotid stenosis: I discussed treating him medically with a statin, and ARB for hypertension and aspirin, until he develops a stenosis greater than 80%, or if he has a symptom.  We did go over the signs and symptoms of stroke during today's visit.  I have him scheduled for follow-up in 6 months with repeat carotid duplex.   Leia Alf, MD, FACS [facility] [phone] Pager [phone]

## 2020-01-16 DIAGNOSIS — K7469 Other cirrhosis of liver: Secondary | ICD-10-CM | POA: Diagnosis not present

## 2020-01-16 DIAGNOSIS — K7460 Unspecified cirrhosis of liver: Secondary | ICD-10-CM | POA: Diagnosis not present

## 2020-02-03 ENCOUNTER — Other Ambulatory Visit (HOSPITAL_BASED_OUTPATIENT_CLINIC_OR_DEPARTMENT_OTHER): Payer: Self-pay | Admitting: Family Medicine

## 2020-02-09 DIAGNOSIS — H0102B Squamous blepharitis left eye, upper and lower eyelids: Secondary | ICD-10-CM | POA: Diagnosis not present

## 2020-02-09 DIAGNOSIS — H52203 Unspecified astigmatism, bilateral: Secondary | ICD-10-CM | POA: Diagnosis not present

## 2020-02-09 DIAGNOSIS — H0102A Squamous blepharitis right eye, upper and lower eyelids: Secondary | ICD-10-CM | POA: Diagnosis not present

## 2020-02-09 DIAGNOSIS — H43811 Vitreous degeneration, right eye: Secondary | ICD-10-CM | POA: Diagnosis not present

## 2020-02-09 DIAGNOSIS — Z961 Presence of intraocular lens: Secondary | ICD-10-CM | POA: Diagnosis not present

## 2020-02-09 DIAGNOSIS — H524 Presbyopia: Secondary | ICD-10-CM | POA: Diagnosis not present

## 2020-02-09 DIAGNOSIS — H5211 Myopia, right eye: Secondary | ICD-10-CM | POA: Diagnosis not present

## 2020-02-20 MED FILL — OLMESARTAN MEDOXOMIL 20 MG: 20 | 90 days supply | Qty: 90 | Fill #0

## 2020-02-27 DIAGNOSIS — G4733 Obstructive sleep apnea (adult) (pediatric): Secondary | ICD-10-CM | POA: Diagnosis not present

## 2020-04-19 ENCOUNTER — Other Ambulatory Visit: Payer: Self-pay

## 2020-04-19 ENCOUNTER — Other Ambulatory Visit: Payer: Self-pay | Admitting: Physician Assistant

## 2020-04-19 ENCOUNTER — Emergency Department (HOSPITAL_COMMUNITY)
Admission: EM | Admit: 2020-04-19 | Discharge: 2020-04-19 | Disposition: A | Discharge status: Home / Self Care | Payer: PPO | Attending: Emergency Medicine | Admitting: Emergency Medicine

## 2020-04-19 ENCOUNTER — Emergency Department (HOSPITAL_COMMUNITY): Payer: PPO

## 2020-04-19 DIAGNOSIS — R000 Tachycardia, unspecified: Secondary | ICD-10-CM | POA: Insufficient documentation

## 2020-04-19 DIAGNOSIS — Z7982 Long term (current) use of aspirin: Secondary | ICD-10-CM | POA: Diagnosis not present

## 2020-04-19 DIAGNOSIS — R0602 Shortness of breath: Secondary | ICD-10-CM | POA: Diagnosis not present

## 2020-04-19 DIAGNOSIS — I471 Supraventricular tachycardia: Secondary | ICD-10-CM

## 2020-04-19 DIAGNOSIS — I10 Essential (primary) hypertension: Secondary | ICD-10-CM | POA: Insufficient documentation

## 2020-04-19 DIAGNOSIS — Z8679 Personal history of other diseases of the circulatory system: Secondary | ICD-10-CM | POA: Diagnosis not present

## 2020-04-19 DIAGNOSIS — R002 Palpitations: Secondary | ICD-10-CM | POA: Diagnosis not present

## 2020-04-19 DIAGNOSIS — Z79899 Other long term (current) drug therapy: Secondary | ICD-10-CM | POA: Diagnosis not present

## 2020-04-19 DIAGNOSIS — I248 Other forms of acute ischemic heart disease: Secondary | ICD-10-CM | POA: Diagnosis not present

## 2020-04-19 DIAGNOSIS — Z87891 Personal history of nicotine dependence: Secondary | ICD-10-CM | POA: Insufficient documentation

## 2020-04-19 DIAGNOSIS — I491 Atrial premature depolarization: Secondary | ICD-10-CM | POA: Diagnosis not present

## 2020-04-19 DIAGNOSIS — R0600 Dyspnea, unspecified: Secondary | ICD-10-CM

## 2020-04-19 DIAGNOSIS — I2510 Atherosclerotic heart disease of native coronary artery without angina pectoris: Secondary | ICD-10-CM | POA: Diagnosis not present

## 2020-04-19 DIAGNOSIS — R42 Dizziness and giddiness: Secondary | ICD-10-CM | POA: Insufficient documentation

## 2020-04-19 DIAGNOSIS — R079 Chest pain, unspecified: Secondary | ICD-10-CM | POA: Diagnosis not present

## 2020-04-19 DIAGNOSIS — R0609 Other forms of dyspnea: Secondary | ICD-10-CM

## 2020-04-19 LAB — COMPREHENSIVE METABOLIC PANEL
ALT: 25 U/L (ref 0–44)
AST: 30 U/L (ref 15–41)
Albumin: 4 g/dL (ref 3.5–5.0)
Alkaline Phosphatase: 45 U/L (ref 38–126)
Anion gap: 11 (ref 5–15)
BUN: 14 mg/dL (ref 8–23)
CO2: 19 mmol/L — ABNORMAL LOW (ref 22–32)
Calcium: 8.9 mg/dL (ref 8.9–10.3)
Chloride: 104 mmol/L (ref 98–111)
Creatinine, Ser: 1.07 mg/dL (ref 0.61–1.24)
GFR, Estimated: >60 mL/min (ref >60)
Glucose, Bld: 109 mg/dL — ABNORMAL HIGH (ref 70–99)
Potassium: 4.1 mmol/L (ref 3.5–5.1)
Sodium: 134 mmol/L — ABNORMAL LOW (ref 135–145)
Total Bilirubin: 1.1 mg/dL (ref 0.3–1.2)
Total Protein: 6.3 g/dL — ABNORMAL LOW (ref 6.5–8.1)

## 2020-04-19 LAB — CBC WITH DIFFERENTIAL/PLATELET
Abs Immature Granulocytes: 0.03 K/uL (ref 0.00–0.07)
Basophils Absolute: 0 K/uL (ref 0.0–0.1)
Basophils Relative: 0 %
Eosinophils Absolute: 0.2 K/uL (ref 0.0–0.5)
Eosinophils Relative: 2 %
HCT: 44.2 % (ref 39.0–52.0)
Hemoglobin: 15.1 g/dL (ref 13.0–17.0)
Immature Granulocytes: 0 %
Lymphocytes Relative: 19 %
Lymphs Abs: 1.6 K/uL (ref 0.7–4.0)
MCH: 34.3 pg — ABNORMAL HIGH (ref 26.0–34.0)
MCHC: 34.2 g/dL (ref 30.0–36.0)
MCV: 100.5 fL — ABNORMAL HIGH (ref 80.0–100.0)
Monocytes Absolute: 0.7 K/uL (ref 0.1–1.0)
Monocytes Relative: 8 %
Neutro Abs: 6.1 K/uL (ref 1.7–7.7)
Neutrophils Relative %: 71 %
Platelets: 273 K/uL (ref 150–400)
RBC: 4.4 MIL/uL (ref 4.22–5.81)
RDW: 11.9 % (ref 11.5–15.5)
WBC: 8.7 K/uL (ref 4.0–10.5)
nRBC: 0 % (ref 0.0–0.2)

## 2020-04-19 LAB — MAGNESIUM: Magnesium: 1.9 mg/dL (ref 1.7–2.4)

## 2020-04-19 LAB — TROPONIN I (HIGH SENSITIVITY)
Troponin I (High Sensitivity): 52 ng/L — ABNORMAL HIGH (ref <18)
Troponin I (High Sensitivity): 57 ng/L — ABNORMAL HIGH

## 2020-04-19 LAB — TSH: TSH: 1.94 u[IU]/mL (ref 0.350–4.500)

## 2020-04-19 MED ORDER — METOPROLOL TARTRATE 25 MG PO TABS
25.0000 mg | ORAL_TABLET | Freq: Two times a day (BID) | ORAL | 0 refills | Status: DC
Start: 2020-04-19 — End: 2020-05-11

## 2020-04-19 MED ORDER — METOPROLOL TARTRATE 25 MG PO TABS
25.0000 mg | ORAL_TABLET | Freq: Once | ORAL | Status: AC
  Administered 2020-04-19: 25 mg via ORAL
  Filled 2020-04-19: qty 1

## 2020-04-19 NOTE — Discharge Instructions (Addendum)
You have been seen and discharged from the emergency department.  You have periods of fast heart rate.  Cardiology has evaluated you, they recommend putting you on a low-dose metoprolol twice a day to prevent these episodes of tachycardia.  Follow-up with your primary provider and cardiologist for reevaluation. Take new prescriptions and home medications as prescribed. If you have any worsening symptoms, severe chest pain, passing out, difficulty breathing or further concerns for health please return to an emergency department for further evaluation.

## 2020-04-19 NOTE — Progress Notes (Signed)
Cardiology Consultation:   Patient ID: Tommy Santiago MRN: [id number]; DOB: [birth year]  Admit date: 04/19/2020 Date of Consult: 04/19/2020  PCP:  Bernerd Limbo, MD   [place]  Cardiologist:  Lauree Chandler, MD  Advanced Practice Provider:  No care team member to display Electrophysiologist:  None   Click here to update Patient Care Team and Refresh Note - MD (PCP) or APP (Team Member)  Change PCP Type for MD, Specialty for APP is either Cardiology or Clinical Cardiac Electrophysiology  :[id number]}    Patient Profile:   EMMITTE SURGEON is a 75 y.o. male with a hx of CAD who is being seen today for the evaluation of SVT at the request of Dr. Dina Rich.  History of Present Illness:   Mr. Tommy Santiago is a 75 year old male with a history of CAD status post DES in 2010 to mid LAD, hypertension, hyperlipidemia, tobacco use, bronchiectasis, hepatitis C who we are consulted by Dr. Dina Rich for evaluation of tachycardia.  He reported that he started having palpitations yesterday that he described as brief episodes of fluttering feeling in the chest. Felt lightheaded during some of the episodes. Would only last for few seconds. Went to urgent care today and was noted to have rapid heartbeat with lightheadedness.  EMS was called and was found to have short runs of SVT lasting a few seconds.  [facility] to [facility].  Initial vital signs showed BP 161/89, pulse 96, SPO2 99% on room air.  Labs notable for sodium 134, potassium 4.1, creatinine 1.07, WBC 8.7, hemoglobin 15.1, platelets 273, troponin 52>57, TSH 1.9.  Chest x-ray showed minimal bibasilar subsegmental atelectasis or scarring.  EKG shows sinus rhythm, rate 61, PAC, no ST abnormalities.   Past Medical History:  Diagnosis Date  . Bronchiectasis ([place])   . Bronchiectasis ([place])   . CAP (community acquired pneumonia)   . Carotid artery occlusion   . Coronary atherosclerosis of unspecified type of vessel, native or graft     status post cardiac cath w percutaneous coronary intervention using XIENCE drug-eluting stent to mid left anterior descending 07/23/08; LHC 08/07/11: Mid LAD stent patent, moderate size diagonal branch jailed by stent with 70% ostial stenosis, unchanged from 2010 with excellent flow down the diagonal branch, mid circumflex 30%, proximal and mid RCA 20%, EF 60%.  Medical therapy continued.   Marland Kitchen GERD (gastroesophageal reflux disease)   . Hepatitis C    Treated [facility]  . Hyperlipemia   . Hypertension   . Hypopotassemia   . Lichen planus   . Other and unspecified hyperlipidemia   . Other specified cardiac dysrhythmias(427.89)   . Paroxysmal supraventricular tachycardia ([place])   . Personal history of other infectious and parasitic disease   . Plantar fascial fibromatosis   . Pneumonia   . Syncope and collapse     Past Surgical History:  Procedure Laterality Date  . CATARACT EXTRACTION    . CHOLECYSTECTOMY    . COLONOSCOPY    . colonscopy     status post  . CORONARY ANGIOPLASTY WITH STENT PLACEMENT    . ESOPHAGOGASTRODUODENOSCOPY     status post  . INSERTION OF MESH N/A 01/30/2017   Procedure: INSERTION OF MESH;  Surgeon: Georganna Skeans, MD;  Location: [place];  Service: General;  Laterality: N/A;  . KNEE ARTHROSCOPY    . LEFT HEART CATHETERIZATION WITH CORONARY ANGIOGRAM  08/07/2011   Procedure: LEFT HEART CATHETERIZATION WITH CORONARY ANGIOGRAM;  Surgeon: Burnell Blanks, MD;  Location: [facility] CATH [facility];  Service: Cardiovascular;;  . UMBILICAL HERNIA REPAIR N/A 01/30/2017   Procedure: REPAIR OF UMBILICAL HERNIA;  Surgeon: Georganna Skeans, MD;  Location: [place];  Service: General;  Laterality: N/A;    Inpatient Medications: Scheduled Meds:  Continuous Infusions:  PRN Meds:   Allergies:    Allergies  Allergen Reactions  . Amlodipine Swelling and Other (See Comments)    Ankle swelling    Social History:   Social History    Socioeconomic History  . Marital status: Married    Spouse name: Not on file  . Number of children: Not on file  . Years of education: Not on file  . Highest education level: Not on file  Occupational History  . Occupation: Critical care RN  Tobacco Use  . Smoking status: Former Smoker    Packs/day: 2.00    Years: 25.00    Pack years: 50.00    Quit date: 1989    Years since quitting: 33.1  . Smokeless tobacco: Never Used  Vaping Use  . Vaping Use: Never used  Substance and Sexual Activity  . Alcohol use: Yes    Alcohol/week: 14.0 standard drinks    Types: 14 Standard drinks or equivalent per week    Comment: social  . Drug use: No    Comment: quit 25 years ago  . Sexual activity: Not on file  Other Topics Concern  . Not on file  Social History Narrative  . Not on file   Social Determinants of Health   Financial Resource Strain: Not on file  Food Insecurity: Not on file  Transportation Needs: Not on file  Physical Activity: Not on file  Stress: Not on file  Social Connections: Not on file  Intimate Partner Violence: Not on file    Family History:    Family History  Problem Relation Age of Onset  . Heart disease Father   . Coronary artery disease Brother        diagnosed in early 27s  . Rectal cancer Brother   . Coronary artery disease Other        hx family     ROS:  Please see the history of present illness.   All other ROS reviewed and negative.     Physical Exam/Data:   Vitals:   04/19/20 1700 04/19/20 1715 04/19/20 1730 04/19/20 1800  BP: 137/88 (!) 133/94 (!) 144/96 (!) 134/99  Pulse: 74 82 79 74  Resp: 20 16 17 19   Temp:      TempSrc:      SpO2: 98% 99% 98% 98%  Weight:      Height:       No intake or output data in the 24 hours ending 04/19/20 1845 Last 3 Weights 04/19/2020 01/09/2020 09/22/2019  Weight (lbs) 222 lb 227 lb 222 lb  Weight (kg) 100.699 kg 102.967 kg 100.699 kg     Body mass index is 30.96 kg/m.  General:  in no acute  distress HEENT: normal Neck: no JVD Vascular: No carotid bruits Cardiac:  normal S1, S2; normal rate, irregular; no murmur Lungs:  clear to auscultation bilaterally, no wheezing, rhonchi or rales  Abd: soft, nontender, no hepatomegaly  Ext: no edema Musculoskeletal:  No deformities, BUE and BLE strength normal and equal Skin: warm and dry  Neuro: no focal abnormalities noted Psych:  Normal affect   EKG:  The EKG was personally reviewed and demonstrates:  EKG shows sinus rhythm, rate 61, PAC, no  ST abnormalities. Telemetry:  Telemetry was personally reviewed and demonstrates: Normal sinus rhythm with rate 70s to 80s. Frequent PACs and episodes of SVT lasting less than 10 seconds  Relevant CV Studies:   Laboratory Data:  High Sensitivity Troponin:   Recent Labs  Lab 04/19/20 1503 04/19/20 1728  TROPONINIHS 52* 57*     Chemistry Recent Labs  Lab 04/19/20 1503  NA 134*  K 4.1  CL 104  CO2 19*  GLUCOSE 109*  BUN 14  CREATININE 1.07  CALCIUM 8.9  GFRNONAA >60  ANIONGAP 11    Recent Labs  Lab 04/19/20 1503  PROT 6.3*  ALBUMIN 4.0  AST 30  ALT 25  ALKPHOS 45  BILITOT 1.1   Hematology Recent Labs  Lab 04/19/20 1503  WBC 8.7  RBC 4.40  HGB 15.1  HCT 44.2  MCV 100.5*  MCH 34.3*  MCHC 34.2  RDW 11.9  PLT 273   BNPNo results for input(s): BNP, PROBNP in the last 168 hours.  DDimer No results for input(s): DDIMER in the last 168 hours.   Radiology/Studies:  [facility] Chest Port 1 View  Result Date: 04/19/2020 CLINICAL DATA:  Chest pain. EXAM: PORTABLE CHEST 1 VIEW COMPARISON:  September 22, 2019. FINDINGS: The heart size and mediastinal contours are within normal limits. No pneumothorax or pleural effusion is noted. Minimal bibasilar subsegmental atelectasis or scarring is noted. The visualized skeletal structures are unremarkable. IMPRESSION: Minimal bibasilar subsegmental atelectasis or scarring. Electronically Signed   By: Marijo Conception M.D.   On: 04/19/2020 16:08     Assessment and Plan:   SVT: Noted to have frequent episodes of SVT on telemetry. Episodes are short duration, lasting less than 10 seconds. He does report symptoms of palpitations and occasional lightheadedness during these episodes. Denies any syncope, chest pain, shortness of breath.  -Recommend starting metoprolol 25 mg twice daily. -Recommend Zio patch x7 days and echocardiogram as outpatient  Troponin elevation: 52 > 57. Flat mild elevation in troponins not consistent with acute coronary syndrome. No anginal symptoms. Suspect demand ischemia in setting of his SVT as above  Disposition: OK to discharge from ED. Will schedule outpatient follow-up with Dr. Angelena Form.   For questions or updates, please contact [place] Please consult [email or web address] for contact info under    Signed, Donato Heinz, MD  04/19/2020 6:45 PM

## 2020-04-19 NOTE — ED Triage Notes (Signed)
[facility] From [facility], rapid HR with lightheadedness. Runs of SVT on the monitor per MEDIC. Pt denies CP, "a little SOB", no cardiac history

## 2020-04-19 NOTE — ED Provider Notes (Signed)
[facility] EMERGENCY DEPARTMENT Provider Note   CSN: [id number] Arrival date & time: 04/19/20  1446     History Chief Complaint  Patient presents with  . Tachycardia    From UC, rapid HR with lightheadedness. Runs of SVT on the monitor per MEDIC    Tommy Santiago is a 75 y.o. male.  HPI   75 year old male with past medical history of HTN, CAD status post PCI, paroxysmal tachycardia "in the 1970s", HLD presents the emergency department with concern for lightheadedness.  Patient states today he has been having episodes of lightheadedness with some mild shortness of breath.  He said no syncope.  He was evaluated in urgent care and it was recommended that he is evaluated here, transported by EMS.  EMS had a couple rhythm strips of probable SVT in route.  Patient was not given any medications.  On arrival patient currently denies any chest pain or shortness of breath.  He has had no recent illness or fever.  He has been compliant with his medications, denies any swelling of his leg/long trips/risk factors for DVT/PE.  Past Medical History:  Diagnosis Date  . Bronchiectasis ([place])   . Bronchiectasis ([place])   . CAP (community acquired pneumonia)   . Carotid artery occlusion   . Coronary atherosclerosis of unspecified type of vessel, native or graft    status post cardiac cath w percutaneous coronary intervention using XIENCE drug-eluting stent to mid left anterior descending 07/23/08; LHC 08/07/11: Mid LAD stent patent, moderate size diagonal branch jailed by stent with 70% ostial stenosis, unchanged from 2010 with excellent flow down the diagonal branch, mid circumflex 30%, proximal and mid RCA 20%, EF 60%.  Medical therapy continued.   Marland Kitchen GERD (gastroesophageal reflux disease)   . Hepatitis C    Treated [place]  . Hyperlipemia   . Hypertension   . Hypopotassemia   . Lichen planus   . Other and unspecified hyperlipidemia   . Other specified cardiac dysrhythmias(427.89)    . Paroxysmal supraventricular tachycardia ([place])   . Personal history of other infectious and parasitic disease   . Plantar fascial fibromatosis   . Pneumonia   . Syncope and collapse     Patient Active Problem List   Diagnosis Date Noted  . Syncope 09/23/2019  . OSA on CPAP 09/23/2019  . Alcohol use 09/23/2019  . GERD (gastroesophageal reflux disease) 09/23/2019  . Hyponatremia 09/22/2019  . Obesity (BMI 30-39.9) 06/24/2017  . Coronary artery disease involving native coronary artery of native heart 06/22/2017  . Witnessed episode of apnea 06/22/2017  . Nocturia more than twice per night 06/22/2017  . Snoring 06/22/2017  . 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 Family history of colon cancer 10/28/2016  . History of adenomatous polyp of colon 10/28/2016  . 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 Hyperglycemia 07/11/2016  . Sepsis ([place]) 07/11/2016  . CAP (community acquired pneumonia) 07/10/2016  . Dysphagia 05/08/2016  . Bronchiectasis ([place]) 04/29/2016  . Prediabetes 03/28/2016  . Age-related incipient cataract of both eyes 09/28/2015  . Hx of cataract surgery 09/28/2015  . Umbilical hernia without obstruction and without gangrene 09/28/2015  . Closed compression fracture of second lumbar vertebra ([place]) 01/23/2015  . Annual physical exam 09/20/2014  . Hepatitis C 07/20/2012  . History of hepatitis C 07/20/2012  . Personal history of tobacco use, presenting hazards to health 07/20/2012  . Depressive disorder 11/06/2011  .  ED (erectile dysfunction) of organic origin 11/06/2011  . Osteopenia 11/06/2011  . CAD, NATIVE VESSEL 07/24/2009  . Hyperlipidemia 06/30/2008  . HYPOKALEMIA 06/30/2008  . Essential hypertension 06/30/2008  . PAROXYSMAL SUPRAVENTRICULAR TACHYCARDIA 06/30/2008  . BRADYCARDIA 06/30/2008  . LICHEN PLANUS, MOUTH 06/30/2008  . PLANTAR FASCIITIS 06/30/2008  .  VASOVAGAL SYNCOPE 06/30/2008  . HEPATITIS C, HX OF 06/30/2008    Past Surgical History:  Procedure Laterality Date  . CATARACT EXTRACTION    . CHOLECYSTECTOMY    . COLONOSCOPY    . colonscopy     status post  . CORONARY ANGIOPLASTY WITH STENT PLACEMENT    . ESOPHAGOGASTRODUODENOSCOPY     status post  . INSERTION OF MESH N/A 01/30/2017   Procedure: INSERTION OF MESH;  Surgeon: Georganna Skeans, MD;  Location: [facility];  Service: General;  Laterality: N/A;  . KNEE ARTHROSCOPY    . LEFT HEART CATHETERIZATION WITH CORONARY ANGIOGRAM  08/07/2011   Procedure: LEFT HEART CATHETERIZATION WITH CORONARY ANGIOGRAM;  Surgeon: Burnell Blanks, MD;  Location: [facility] CATH LAB;  Service: Cardiovascular;;  . UMBILICAL HERNIA REPAIR N/A 01/30/2017   Procedure: REPAIR OF UMBILICAL HERNIA;  Surgeon: Georganna Skeans, MD;  Location: [facility];  Service: General;  Laterality: N/A;       Family History  Problem Relation Age of Onset  . Heart disease Father   . Coronary artery disease Brother        diagnosed in early 70s  . Rectal cancer Brother   . Coronary artery disease Other        hx family    Social History   Tobacco Use  . Smoking status: Former Smoker    Packs/day: 2.00    Years: 25.00    Pack years: 50.00    Quit date: 1989    Years since quitting: 33.1  . Smokeless tobacco: Never Used  Vaping Use  . Vaping Use: Never used  Substance Use Topics  . Alcohol use: Yes    Alcohol/week: 14.0 standard drinks    Types: 14 Standard drinks or equivalent per week    Comment: social  . Drug use: No    Comment: quit 25 years ago    Home Medications Prior to Admission medications   Medication Sig Start Date End Date Taking? Authorizing Provider  albuterol (PROVENTIL) (2.5 MG/3ML) 0.083% nebulizer solution Take 3 mLs (2.5 mg total) by nebulization every 6 (six) hours as needed for wheezing or shortness of breath. 06/14/19   Chesley Mires, MD  aspirin EC 81  MG tablet Take 1 tablet (81 mg total) by mouth daily. 06/24/17   Imogene Burn, PA-C  atorvastatin (LIPITOR) 20 MG tablet TAKE 1 TABLET BY MOUTH DAILY. Patient taking differently: Take 20 mg by mouth daily.  08/25/19   Burnell Blanks, MD  B Complex Vitamins (VITAMIN B COMPLEX PO) Take 1 tablet by mouth daily.     [provider]  Calcium Carbonate-Vitamin D (CALCIUM 600/VITAMIN D) 600-400 MG-UNIT per tablet Take 1 tablet by mouth 2 (two) times daily.      [provider]  dextromethorphan-guaiFENesin (ROBITUSSIN-DM) 10-100 MG/5ML liquid Take 10 mLs by mouth every 6 (six) hours as needed for cough.    [provider]  famotidine (PEPCID) 20 MG tablet Take 20 mg by mouth 2 (two) times daily as needed for heartburn.  11/30/17   [provider]  Melatonin 5 MG TABS Take 5 mg by mouth as needed (sleep).  [provider]  MISC NATURAL PRODUCTS PO Take 20 mg by mouth daily as needed (sleep). CBD Gummy, uses for anxiety and sleep     [provider]  nitroGLYCERIN (NITROSTAT) 0.4 MG SL tablet Place 1 tablet (0.4 mg total) under the tongue every 5 (five) minutes as needed for chest pain. 07/20/19   Burnell Blanks, MD  olmesartan (BENICAR) 20 MG tablet Take 20 mg by mouth daily.  02/22/19   [provider]  psyllium (METAMUCIL) 58.6 % powder Take 1 packet by mouth 2 (two) times a day. Patient uses this mediation for regularity.    [provider]  Respiratory Therapy Supplies (FLUTTER) DEVI Use as directed 06/14/19   Chesley Mires, MD  sodium chloride HYPERTONIC 3 % nebulizer solution Take by nebulization in the morning and at bedtime. 06/14/19   Chesley Mires, MD  tadalafil (CIALIS) 20 MG tablet Take 20 mg by mouth daily as needed. Patient uses this medication for ED.    [provider]  VITAMIN D, CHOLECALCIFEROL, PO Take 1 tablet by mouth daily.    [provider]  VITAMIN E PO Take 1 tablet by mouth  daily.    [provider]    Allergies    Amlodipine  Review of Systems   Review of Systems  Constitutional: Positive for fatigue. Negative for chills and fever.  HENT: Negative for congestion.   Eyes: Negative for visual disturbance.  Respiratory: Positive for shortness of breath.   Cardiovascular: Negative for chest pain, palpitations and leg swelling.  Gastrointestinal: Negative for abdominal pain, diarrhea and vomiting.  Genitourinary: Negative for dysuria.  Musculoskeletal: Negative for back pain.  Skin: Negative for rash.  Neurological: Positive for light-headedness. Negative for syncope and headaches.    Physical Exam Updated Vital Signs BP (!) 161/89 (BP Location: Left Arm)   Pulse 96   Temp 97.8 F (36.6 C) (Oral)   Resp 18   Ht 5\' 11"  (1.803 m)   Wt 100.7 kg   SpO2 99%   BMI 30.96 kg/m   Physical Exam Vitals and nursing note reviewed.  Constitutional:      General: He is not in acute distress.    Appearance: Normal appearance. He is not ill-appearing or toxic-appearing.     Comments: Very pleasant  HENT:     Head: Normocephalic.     Mouth/Throat:     Mouth: Mucous membranes are moist.  Cardiovascular:     Rate and Rhythm: Normal rate.     Comments: Appears sinus on the rhythm, does have periods of tachycardia with rates around 130, unclear if this is atrial fibrillation, have not been able to catch this on an EKG Pulmonary:     Effort: Pulmonary effort is normal. No respiratory distress.  Abdominal:     Palpations: Abdomen is soft.     Tenderness: There is no abdominal tenderness.  Musculoskeletal:        General: No swelling.  Skin:    General: Skin is warm.  Neurological:     Mental Status: He is alert and oriented to person, place, and time. Mental status is at baseline.  Psychiatric:        Mood and Affect: Mood normal.     ED Results / Procedures / Treatments   Labs (all labs ordered are listed, but only abnormal results are  displayed) Labs Reviewed  CBC WITH DIFFERENTIAL/PLATELET  COMPREHENSIVE METABOLIC PANEL  TSH  MAGNESIUM  TROPONIN I (HIGH SENSITIVITY)  EKG EKG Interpretation  Date/Time:  Thursday April 19 2020 14:47:21 EST Ventricular Rate:  61 PR Interval:    QRS Duration: 84 QT Interval:  370 QTC Calculation: 373 R Axis:   63 Text Interpretation: Sinus rhythm Atrial premature complex Abnormal R-wave progression, early transition NSR, PAC, no change from previous Confirmed by Lavenia Atlas [phone]) on 04/19/2020 3:06:10 PM   Radiology No results found.  Procedures Procedures   Medications Ordered in ED Medications - No data to display  ED Course  I have reviewed the triage vital signs and the nursing notes.  Pertinent labs & imaging results that were available during my care of the patient were reviewed by me and considered in my medical decision making (see chart for details).    MDM Rules/Calculators/A&P                          75 year old male presents to the emergency department concern for episodes of lightheadedness and shortness of breath.  He has history of paroxysmal SVT in the 1970s, he is not on any rate control medication, does currently follow with a cardiologist.  He was seen in urgent care and referred here and in route EMS had multiple episodes of possible SVT on the monitor.  Blood pressure remained stable, patient has been awake and alert this whole time.  He arrives with stable vitals.  EKG shows normal sinus rhythm with PAC, no ischemic changes, no acute change from previous.  He is sitting in bed, comfortable, currently asymptomatic.  On the monitor he appears to be sinus rhythm with periods of tachycardia with rates around 130, possible atrial fibrillation.  Electrolytes are normal, TSH is normal.  Troponin is elevated but repeat is flat/stable.  Cardiology was consulted.  Dr. Gardiner Rhyme has evaluated the patient.  Cardiology recommends low-dose metoprolol twice  daily, no indication for emergent admission at this time.  They recommend following up with primary doctor/cardiologist. Patient has no active CP or current symptoms. Is well appearing. Got a dose of metoprolol in the department, tolerated well. Ambulated without difficulty.  Patient will be discharged and treated as an outpatient.  Discharge plan and strict return to ED precautions discussed, patient verbalizes understanding and agreement.  Final Clinical Impression(s) / ED Diagnoses Final diagnoses:  None    Rx / DC Orders ED Discharge Orders    None       Lorelle Gibbs, DO 04/19/20 2050

## 2020-04-19 NOTE — ED Notes (Signed)
Pt d/c by MD and is provided w/ d/c instructions and follow up care, Pt is out of the ED ambulatory by self

## 2020-04-20 ENCOUNTER — Encounter: Payer: Self-pay | Admitting: Radiology

## 2020-04-20 ENCOUNTER — Telehealth: Payer: Self-pay | Admitting: *Deleted

## 2020-04-20 ENCOUNTER — Ambulatory Visit (INDEPENDENT_AMBULATORY_CARE_PROVIDER_SITE_OTHER): Payer: PPO

## 2020-04-20 DIAGNOSIS — R0609 Other forms of dyspnea: Secondary | ICD-10-CM

## 2020-04-20 DIAGNOSIS — I471 Supraventricular tachycardia: Secondary | ICD-10-CM

## 2020-04-20 DIAGNOSIS — R0600 Dyspnea, unspecified: Secondary | ICD-10-CM

## 2020-04-20 MED FILL — METOPROLOL TARTRATE 25 MG T: 25 | 30 days supply | Qty: 60 | Fill #0

## 2020-04-20 NOTE — Telephone Encounter (Signed)
Patient is scheduled for echo and APP appt 3/25.  Monitor mailed today.

## 2020-04-20 NOTE — Telephone Encounter (Signed)
-----   Message from Burnell Blanks, MD sent at 04/20/2020  8:24 AM EST ----- Can we set him up for an echo and a 7 day Zio patch and I can see him in a few weeks or have an office APP see him? Thanks, chris

## 2020-04-20 NOTE — Progress Notes (Signed)
Enrolled patient for a 7 day Zio XT Monitor to be mailed to patients home.  

## 2020-04-24 DIAGNOSIS — I471 Supraventricular tachycardia: Secondary | ICD-10-CM

## 2020-04-24 DIAGNOSIS — R0600 Dyspnea, unspecified: Secondary | ICD-10-CM | POA: Diagnosis not present

## 2020-05-03 ENCOUNTER — Ambulatory Visit: Payer: PPO | Admitting: Podiatry

## 2020-05-07 DIAGNOSIS — I471 Supraventricular tachycardia: Secondary | ICD-10-CM | POA: Diagnosis not present

## 2020-05-07 DIAGNOSIS — R0600 Dyspnea, unspecified: Secondary | ICD-10-CM | POA: Diagnosis not present

## 2020-05-09 ENCOUNTER — Ambulatory Visit: Payer: PPO | Admitting: Podiatry

## 2020-05-09 ENCOUNTER — Other Ambulatory Visit: Payer: Self-pay

## 2020-05-09 ENCOUNTER — Encounter: Payer: Self-pay | Admitting: Podiatry

## 2020-05-09 DIAGNOSIS — M2042 Other hammer toe(s) (acquired), left foot: Secondary | ICD-10-CM

## 2020-05-09 DIAGNOSIS — B351 Tinea unguium: Secondary | ICD-10-CM | POA: Diagnosis not present

## 2020-05-09 DIAGNOSIS — M2041 Other hammer toe(s) (acquired), right foot: Secondary | ICD-10-CM | POA: Diagnosis not present

## 2020-05-09 DIAGNOSIS — L600 Ingrowing nail: Secondary | ICD-10-CM

## 2020-05-09 NOTE — Progress Notes (Signed)
Subjective:   Patient ID: Tommy Santiago, male   DOB: 75 y.o.   MRN: [id number]   HPI Patient presents stating that he has got a thickened left hallux nail that is discomforting at times in the corner and it has broken halfway off and is concerned about what could be done   ROS      Objective:  Physical Exam  Neurovascular status was found to be intact with a damaged left hallux nail with incurvation of the medial corner no drainage currently noted and a lot of yellow discoloration.  Also is noted to have digital deformities bilateral     Assessment:  Combination of traumatized nailbed ingrown component mycotic nail infection and does have digital hammertoe deformity bilateral     Plan:  H&P reviewed all conditions and do not recommend treatment for the nailbed and digital deformities will utilize wider shoes mesh materials and patient will be seen back as needed

## 2020-05-10 ENCOUNTER — Telehealth: Payer: Self-pay | Admitting: *Deleted

## 2020-05-10 DIAGNOSIS — I471 Supraventricular tachycardia: Secondary | ICD-10-CM

## 2020-05-10 NOTE — Telephone Encounter (Signed)
Spoke with patient.  Informed of result of monitor and rec for EP consult.  He has echo on 3/25 and f/u with APP.  Will check w Dr. Angelena Form if ok to cancel the APP f/u appointment 3/25, since pt will be seeing EP.

## 2020-05-10 NOTE — Telephone Encounter (Signed)
-----   Message from Burnell Blanks, MD sent at 05/10/2020 10:44 AM EDT ----- SVT on monitor, symptomatic. Continue metoprolol. Refer to EP. cdm

## 2020-05-11 ENCOUNTER — Other Ambulatory Visit: Payer: Self-pay | Admitting: Cardiovascular Disease

## 2020-05-11 MED ORDER — METOPROLOL TARTRATE 25 MG PO TABS: 25.0000 mg | ORAL_TABLET | Freq: Two times a day (BID) | ORAL | 3 refills | Status: DC

## 2020-05-11 NOTE — Telephone Encounter (Signed)
Spoke with the pt and he will keep his appt for his Echo 05/18/20 and I made him an appt with Dr. Quentin Ore per Dr. Angelena Form based on his monitor results.. for 05/29/20.   Angelena Form, MD sent at 05/10/2020 10:44 AM EDT ----- SVT on monitor, symptomatic. Continue metoprolol. Refer to EP. cdm

## 2020-05-11 NOTE — Telephone Encounter (Signed)
Pt called in and is unsure if he is suppose to still have the Echo on the 25th? And I seen the the 25th appt was not needed but he had a few more questions about it and about the appt on the 30th with Dr Angelena Form.  He also had some question about seeing the EP dr.  Abbott Pao stated he was going to be out of town on the 30th and could not make that appt anyway. I cancelled that appt.     Best number [phone]

## 2020-05-14 MED FILL — METOPROLOL TARTRATE 25 MG T: 25 | 30 days supply | Qty: 60 | Fill #0

## 2020-05-14 NOTE — Telephone Encounter (Signed)
Thanks

## 2020-05-18 ENCOUNTER — Ambulatory Visit: Payer: PPO | Admitting: Adult Health

## 2020-05-18 ENCOUNTER — Ambulatory Visit (HOSPITAL_COMMUNITY): Payer: PPO | Attending: Cardiology

## 2020-05-18 ENCOUNTER — Other Ambulatory Visit: Payer: Self-pay

## 2020-05-18 DIAGNOSIS — R0600 Dyspnea, unspecified: Secondary | ICD-10-CM | POA: Diagnosis not present

## 2020-05-18 DIAGNOSIS — I471 Supraventricular tachycardia: Secondary | ICD-10-CM | POA: Diagnosis not present

## 2020-05-18 DIAGNOSIS — R0609 Other forms of dyspnea: Secondary | ICD-10-CM

## 2020-05-18 LAB — ECHOCARDIOGRAM COMPLETE
Area-P 1/2: 3.39 cm2
S' Lateral: 2.6 cm

## 2020-05-18 MED FILL — OLMESARTAN MEDOXOMIL 20 MG: 20 | 90 days supply | Qty: 90 | Fill #1

## 2020-05-23 ENCOUNTER — Ambulatory Visit: Payer: PPO | Admitting: Cardiovascular Disease

## 2020-05-29 ENCOUNTER — Other Ambulatory Visit: Payer: Self-pay

## 2020-05-29 ENCOUNTER — Ambulatory Visit: Payer: PPO | Admitting: Cardiology

## 2020-05-29 ENCOUNTER — Encounter: Payer: Self-pay | Admitting: Cardiology

## 2020-05-29 VITALS — BP 136/84 | HR 47 | Ht 71.0 in | Wt 231.8 lb

## 2020-05-29 DIAGNOSIS — I6529 Occlusion and stenosis of unspecified carotid artery: Secondary | ICD-10-CM

## 2020-05-29 DIAGNOSIS — I2510 Atherosclerotic heart disease of native coronary artery without angina pectoris: Secondary | ICD-10-CM

## 2020-05-29 DIAGNOSIS — I471 Supraventricular tachycardia: Secondary | ICD-10-CM | POA: Diagnosis not present

## 2020-05-29 DIAGNOSIS — G4733 Obstructive sleep apnea (adult) (pediatric): Secondary | ICD-10-CM | POA: Diagnosis not present

## 2020-05-29 DIAGNOSIS — I10 Essential (primary) hypertension: Secondary | ICD-10-CM | POA: Diagnosis not present

## 2020-05-29 NOTE — Patient Instructions (Addendum)
Medication Instructions:  Your physician recommends that you continue on your current medications as directed. Please refer to the Current Medication list given to you today.  Labwork: None ordered.  Testing/Procedures: None ordered.  Follow-Up: Your physician wants you to follow-up in: 6 months with Lars Mage, MD    Any Other Special Instructions Will Be Listed Below (If Applicable).  If you need a refill on your cardiac medications before your next appointment, please call your pharmacy.

## 2020-05-29 NOTE — Progress Notes (Signed)
Electrophysiology Office Note:    Date:  05/29/2020   ID:  Tommy Santiago, DOB 1945/10/10, MRN [id number]  PCP:  Bernerd Limbo, MD  [facility] Cardiologist:  Lauree Chandler, MD  [facility] Electrophysiologist:  None   Referring MD: Bernerd Limbo, MD   Chief Complaint: SVT  History of Present Illness:    Tommy Santiago is a 75 y.o. male who presents for an evaluation of SVT at the request of Dr. Angelena Form. Their medical history includes hypertension, hyperlipidemia, hepatitis C, GERD, carotid artery occlusion, coronary artery disease.  He was last seen in the emergency department on April 19, 2020 for lightheadedness.  He was transported to the emergency department by EMS who performed an EKG in route showing episodes of SVT.  In the emergency department he had episodes of tachycardia with rates around 130 bpm.  He was started on metoprolol tartrate 25 mg twice daily.  Since starting that medication his palpitations have completely resolved.  Past Medical History:  Diagnosis Date  . Bronchiectasis ([place])   . Bronchiectasis ([place])   . CAP (community acquired pneumonia)   . Carotid artery occlusion   . Coronary atherosclerosis of unspecified type of vessel, native or graft    status post cardiac cath w percutaneous coronary intervention using XIENCE drug-eluting stent to mid left anterior descending 07/23/08; LHC 08/07/11: Mid LAD stent patent, moderate size diagonal branch jailed by stent with 70% ostial stenosis, unchanged from 2010 with excellent flow down the diagonal branch, mid circumflex 30%, proximal and mid RCA 20%, EF 60%.  Medical therapy continued.   Marland Kitchen GERD (gastroesophageal reflux disease)   . Hepatitis C    T[facility] [place]  . Hyperlipemia   . Hypertension   . Hypopotassemia   . Lichen planus   . Other and unspecified hyperlipidemia   . Other specified cardiac dysrhythmias(427.89)   . Paroxysmal supraventricular tachycardia ([place])   . Personal history of  other infectious and parasitic disease   . Plantar fascial fibromatosis   . Pneumonia   . Syncope and collapse     Past Surgical History:  Procedure Laterality Date  . CATARACT EXTRACTION    . CHOLECYSTECTOMY    . COLONOSCOPY    . colonscopy     status post  . CORONARY ANGIOPLASTY WITH STENT PLACEMENT    . ESOPHAGOGASTRODUODENOSCOPY     status post  . INSERTION OF MESH N/A 01/30/2017   Procedure: INSERTION OF MESH;  Surgeon: Georganna Skeans, MD;  Location: [facility];  Service: General;  Laterality: N/A;  . KNEE ARTHROSCOPY    . LEFT HEART CATHETERIZATION WITH CORONARY ANGIOGRAM  08/07/2011   Procedure: LEFT HEART CATHETERIZATION WITH CORONARY ANGIOGRAM;  Surgeon: Burnell Blanks, MD;  Location: [facility] C[facility] LAB;  Service: Cardiovascular;;  . UMBILICAL HERNIA REPAIR N/A 01/30/2017   Procedure: REPAIR OF UMBILICAL HERNIA;  Surgeon: Georganna Skeans, MD;  Location: [place];  Service: General;  Laterality: N/A;    Current Medications: Current Meds  Medication Sig  . albuterol (PROVENTIL) (2.5 MG/3ML) 0.083% nebulizer solution Take 3 mLs (2.5 mg total) by nebulization every 6 (six) hours as needed for wheezing or shortness of breath.  Marland Kitchen aspirin EC 81 MG tablet Take 1 tablet (81 mg total) by mouth daily. (Patient taking differently: Take 81 mg by mouth at bedtime.)  . atorvastatin (LIPITOR) 20 MG tablet TAKE 1 TABLET BY MOUTH DAILY. (Patient taking differently: Take 20 mg by mouth at bedtime.)  . B Complex Vitamins (  VITAMIN B COMPLEX PO) Take 1 tablet by mouth daily.   Marland Kitchen CALCIUM PO Take 1 tablet by mouth See admin instructions. Take 1 tablet by mouth one to two times a day  . Cholecalciferol (VITAMIN D-3) 25 MCG (1000 UT) CAPS Take 1,000 Units by mouth daily with breakfast.  . dextromethorphan-guaiFENesin (ROBITUSSIN-DM) 10-100 MG/5ML liquid Take 10 mLs by mouth every 6 (six) hours as needed for cough.  . famotidine (PEPCID) 20 MG tablet Take 20 mg by  mouth daily before breakfast.  . famotidine (PEPCID) 20 MG tablet TAKE 1 TABLET BY MOUTH TWICE DAILY  . Fiber POWD Take by mouth See admin instructions. Mix 1 teaspoonful into 4-8 ounces of water and drink once a day as needed for constipation  . metoprolol tartrate (LOPRESSOR) 25 MG tablet TAKE 1 TABLET BY MOUTH TWICE DAILY  . MISC NATURAL PRODUCTS PO Take 20 mg by mouth See admin instructions. CBD Gummies- Chew 1 gummie (20 mg) by mouth once a day as needed for anxiety and/or sleep  . nitroGLYCERIN (NITROSTAT) 0.4 MG SL tablet Place 1 tablet (0.4 mg total) under the tongue every 5 (five) minutes as needed for chest pain.  Marland Kitchen olmesartan (BENICAR) 20 MG tablet Take 20 mg by mouth at bedtime.  Marland Kitchen olmesartan (BENICAR) 20 MG tablet TAKE 1 TABLET BY MOUTH ONCE DAILY  . olmesartan (BENICAR) 20 MG tablet TAKE 1 TABLET BY MOUTH ONCE DAILY  . Respiratory Therapy Supplies (FLUTTER) DEVI Use as directed  . sodium chloride HYPERTONIC 3 % nebulizer solution Take by nebulization in the morning and at bedtime. (Patient taking differently: Take 4 mLs by nebulization as needed (for chest congestion).)  . tadalafil (CIALIS) 20 MG tablet Take 20 mg by mouth daily as needed for erectile dysfunction.  Marland Kitchen VITAMIN E PO Take 1 capsule by mouth daily with breakfast.     Allergies:   Amlodipine   Social History   Socioeconomic History  . Marital status: Married    Spouse name: Not on file  . Number of children: Not on file  . Years of education: Not on file  . Highest education level: Not on file  Occupational History  . Occupation: Critical care RN  Tobacco Use  . Smoking status: Former Smoker    Packs/day: 2.00    Years: 25.00    Pack years: 50.00    Quit date: 1989    Years since quitting: 33.2  . Smokeless tobacco: Never Used  Vaping Use  . Vaping Use: Never used  Substance and Sexual Activity  . Alcohol use: Yes    Alcohol/week: 14.0 standard drinks    Types: 14 Standard drinks or equivalent per  week    Comment: social  . Drug use: No    Comment: quit 25 years ago  . Sexual activity: Not on file  Other Topics Concern  . Not on file  Social History Narrative  . Not on file   Social Determinants of Health   Financial Resource Strain: Not on file  Food Insecurity: Not on file  Transportation Needs: Not on file  Physical Activity: Not on file  Stress: Not on file  Social Connections: Not on file     Family History: The patient's family history includes Coronary artery disease in his brother and another family member; Heart disease in his father; Rectal cancer in his brother.  ROS:   Please see the history of present illness.    All other systems reviewed and are negative.  EKGs/Labs/Other  Studies Reviewed:    The following studies were reviewed today:  May 18, 2020 echo personally reviewed Left ventricular function normal, 60% Mild LVH of the basal septum Right ventricular function normal No significant valvular abnormalities  April 19, 2020 EKG shows sinus rhythm with a single PAC.  04/19/2020 EMS Records    05/08/2020 [facility] personally reviewed 28-182, average 63bpm 6199 episodes of SVT, longest lasting 2:46s with an avg rate of 103 bpm. Patient triggered episodes correspond to SVT PAC burden 10-20%.  . Atrial ectopy has a different P wave morphology than his sinus complexes.   EKG:  The ekg ordered today demonstrates sinus rhythm with a QTC of 390 ms.   Recent Labs: 04/19/2020: ALT 25; BUN 14; Creatinine, Ser 1.07; Hemoglobin 15.1; Magnesium 1.9; Platelets 273; Potassium 4.1; Sodium 134; TSH 1.940  Recent Lipid Panel    Component Value Date/Time   CHOL 120 04/19/2018 1018   TRIG 73 04/19/2018 1018   HDL 42 04/19/2018 1018   CHOLHDL 2.9 04/19/2018 1018   CHOLHDL 4.9 08/07/2011 0401   VLDL 16 08/07/2011 0401   LDLCALC 63 04/19/2018 1018    Physical Exam:    VS:  BP 136/84   Pulse (!) 47   Ht 5\' 11"  (1.803 m)   Wt 231 lb 12.8 oz (105.1  kg)   SpO2 97%   BMI 32.33 kg/m     Wt Readings from Last 3 Encounters:  05/29/20 231 lb 12.8 oz (105.1 kg)  04/19/20 222 lb (100.7 kg)  01/09/20 227 lb (103 kg)     GEN:  Well nourished, well developed in no acute distress HEENT: Normal NECK: No JVD; No carotid bruits LYMPHATICS: No lymphadenopathy CARDIAC: RRR, no murmurs, rubs, gallops RESPIRATORY:  Clear to auscultation without rales, wheezing or rhonchi  ABDOMEN: Soft, non-tender, non-distended MUSCULOSKELETAL:  No edema; No deformity  SKIN: Warm and dry NEUROLOGIC:  Alert and oriented x 3 PSYCHIATRIC:  Normal affect   ASSESSMENT:    1. SVT (supraventricular tachycardia) (HCC)   2. Stenosis of carotid artery, unspecified laterality   3. Essential hypertension   4. Atherosclerosis of native coronary artery of native heart without angina pectoris    PLAN:    In order of problems listed above:  1. SVT, atrial tachycardia I do not see any evidence of atrial fibrillation on my review of his records.  Thankfully, his atrial tachycardia/PACs have resolved with low-dose metoprolol.  Given his medical comorbidities, options for antiarrhythmic therapy are limited.  In theory we could use sotalol or dofetilide but these would require hospitalization to initiate.  I discussed this with the patient during today's visit.  Given his success using metoprolol, I would favor sticking with this regimen.  I will plan to see him back in 6 months or sooner as needed.  2.  Coronary artery disease, carotid artery disease No ischemic symptoms today.  Precludes safe use of a class Ic agent.  3.  Hypertension Under control on today's check.  Continue current regimen of olmesartan and metoprolol.  Follow-up 6 months.      Medication Adjustments/Labs and Tests Ordered: Current medicines are reviewed at length with the patient today.  Concerns regarding medicines are outlined above.  Orders Placed This Encounter  Procedures  . EKG 12-Lead    No orders of the defined types were placed in this encounter.    Signed, Lars Mage, MD, [facility]  05/29/2020 8:39 AM    Electrophysiology [facility]

## 2020-06-01 ENCOUNTER — Other Ambulatory Visit (HOSPITAL_BASED_OUTPATIENT_CLINIC_OR_DEPARTMENT_OTHER): Payer: Self-pay

## 2020-06-01 MED ORDER — DOXYCYCLINE HYCLATE 100 MG PO CAPS
ORAL_CAPSULE | ORAL | 0 refills | Status: DC
  Filled 2020-06-01: qty 14, 7d supply, fill #0

## 2020-06-04 ENCOUNTER — Other Ambulatory Visit (HOSPITAL_BASED_OUTPATIENT_CLINIC_OR_DEPARTMENT_OTHER): Payer: Self-pay

## 2020-06-04 MED ORDER — IBUPROFEN 600 MG PO TABS
ORAL_TABLET | ORAL | 0 refills | Status: DC
  Filled 2020-06-04: qty 24, 6d supply, fill #0

## 2020-06-04 MED ORDER — AMOXICILLIN 500 MG PO TABS
ORAL_TABLET | ORAL | 0 refills | Status: DC
  Filled 2020-06-04: qty 21, 7d supply, fill #0

## 2020-06-19 ENCOUNTER — Other Ambulatory Visit (HOSPITAL_BASED_OUTPATIENT_CLINIC_OR_DEPARTMENT_OTHER): Payer: Self-pay

## 2020-06-19 MED FILL — Metoprolol Tartrate Tab 25 MG: ORAL | 30 days supply | Qty: 60 | Fill #0 | Status: AC

## 2020-06-26 ENCOUNTER — Other Ambulatory Visit: Payer: Self-pay

## 2020-06-26 DIAGNOSIS — I6521 Occlusion and stenosis of right carotid artery: Secondary | ICD-10-CM

## 2020-06-29 ENCOUNTER — Ambulatory Visit: Payer: PPO | Admitting: Pulmonary Disease

## 2020-06-29 ENCOUNTER — Other Ambulatory Visit: Payer: Self-pay

## 2020-06-29 ENCOUNTER — Other Ambulatory Visit (HOSPITAL_BASED_OUTPATIENT_CLINIC_OR_DEPARTMENT_OTHER): Payer: Self-pay

## 2020-06-29 ENCOUNTER — Encounter: Payer: Self-pay | Admitting: Pulmonary Disease

## 2020-06-29 VITALS — BP 122/62 | HR 45 | Ht 71.0 in | Wt 229.0 lb

## 2020-06-29 DIAGNOSIS — Z23 Encounter for immunization: Secondary | ICD-10-CM

## 2020-06-29 DIAGNOSIS — J479 Bronchiectasis, uncomplicated: Secondary | ICD-10-CM | POA: Diagnosis not present

## 2020-06-29 MED ORDER — DOXYCYCLINE HYCLATE 100 MG PO TABS
100.0000 mg | ORAL_TABLET | Freq: Two times a day (BID) | ORAL | 0 refills | Status: DC
  Filled 2020-06-29: qty 20, 10d supply, fill #0

## 2020-06-29 NOTE — Patient Instructions (Signed)
Follow up in 1 year.

## 2020-06-29 NOTE — Progress Notes (Signed)
[facility]  Chief Complaint  Patient presents with  . Cough    Has had to take antibiotics recently for cough    Constitutional:  BP 122/62   Pulse (!) 45   Ht 5\' 11"  (1.803 m)   Wt 229 lb (103.9 kg)   SpO2 99%   BMI 31.94 kg/m   Past Medical History:  Hep C with cirrhosis, PNA, CAD, GERD, HLD, HTN, Lichen planus, PSVT  Summary:  Tommy Santiago is a 75 y.o. male former smoker with bronchiectasis likely from reflux.  Subjective:   He had to use doxycycline twice last month.  [place] the beach when [place] happened.  He thinks he was getting better after first round, but probably needed longer course.  He has intermittent cough with chest congestion.  Phlegm coming up easily.  Not having fever, chest pain, or hemoptysis.  Using hypertonic saline, mucinex and flutter valve.  He was in hospital recently for SVT and started on metoprolol.  He gets winded while gardening, but keeps up with other activities.  Chest xray from 04/19/20 showed scarring at bases.  Physical Exam:   Appearance - well kempt   ENMT - no sinus tenderness, no oral exudate, no LAN, Mallampati 2 airway, no stridor  Respiratory - equal breath sounds bilaterally, no wheezing or rales  CV - s1s2 regular rate and rhythm, no murmurs  Ext - no clubbing, no edema  Skin - no rashes  Psych - normal mood and affect   Assessment/Plan:   Bronchiectasis. - recovered from recent exacerbation - continue mucinex, albuterol, flutter valve, hypertonic saline as needed - defer chest vest for now - will send 10 day course of doxycycline that he can keep on hand if he has a flare up again - pneumovax booster today  Obstructive sleep apnea. - followed by Dr. Brett Fairy with [facility]  CAD, SVT. - followed by Dr. Angelena Form with [facility] heart care  Hep C with cirrhosis. - followed by Dr. Earlean Shawl with [facility]  A total of  22 minutes spent addressing patient care  issues on day of visit.   Follow up:  Patient Instructions  Follow up in 1 year   Signature:  Chesley Mires, MD [place] Pager: [phone] 06/29/2020, 12:34 PM  Flow Sheet    Chest imaging:   HRCT chest 05/07/16 >> atherosclerosis, GGO in LLL, minimal air trapping, b/l lower lobe cylindrical BTX, liver cirrhosis  Sleep tests:   PSG 08/07/17 >> AHI 10.5, SpO2 low 60%  Medications:   Allergies as of 06/29/2020      Reactions   Amlodipine Swelling, Other (See Comments)   Ankle swelling      Medication List       Accurate as of Jun 29, 2020 12:34 PM. If you have any questions, ask your nurse or doctor.        STOP taking these medications   amoxicillin 500 MG tablet Commonly known as: AMOXIL Stopped by: Chesley Mires, MD   doxycycline 100 MG capsule Commonly known as: VIBRAMYCIN Replaced by: doxycycline 100 MG tablet Stopped by: Chesley Mires, MD     TAKE these medications   albuterol (2.5 MG/3ML) 0.083% nebulizer solution Commonly known as: PROVENTIL Take 3 mLs (2.5 mg total) by nebulization every 6 (six) hours as needed for wheezing or shortness of breath.   aspirin EC 81 MG tablet Take 1 tablet (81 mg total) by mouth daily. What changed: when to take  this   atorvastatin 20 MG tablet Commonly known as: LIPITOR TAKE 1 TABLET BY MOUTH DAILY. What changed:   how much to take  when to take this   CALCIUM PO Take 1 tablet by mouth See admin instructions. Take 1 tablet by mouth one to two times a day   dextromethorphan-guaiFENesin 10-100 MG/5ML liquid Commonly known as: ROBITUSSIN-DM Take 10 mLs by mouth every 6 (six) hours as needed for cough.   doxycycline 100 MG tablet Commonly known as: VIBRA-TABS Take 1 tablet (100 mg total) by mouth 2 (two) times daily. Replaces: doxycycline 100 MG capsule Started by: Chesley Mires, MD   famotidine 20 MG tablet Commonly known as: PEPCID TAKE 1 TABLET BY MOUTH TWICE DAILY   Fiber Powd Take  by mouth See admin instructions. Mix 1 teaspoonful into 4-8 ounces of water and drink once a day as needed for constipation   Flutter Devi Use as directed   ibuprofen 600 MG tablet Commonly known as: ADVIL Take 1 tablet by mouth every 6 - 8 hours as needed for oral pain. **Take with food**   metoprolol tartrate 25 MG tablet Commonly known as: LOPRESSOR TAKE 1 TABLET BY MOUTH TWICE DAILY   MISC NATURAL PRODUCTS PO Take 20 mg by mouth See admin instructions. CBD Gummies- Chew 1 gummie (20 mg) by mouth once a day as needed for anxiety and/or sleep   nitroGLYCERIN 0.4 MG SL tablet Commonly known as: NITROSTAT Place 1 tablet (0.4 mg total) under the tongue every 5 (five) minutes as needed for chest pain.   olmesartan 20 MG tablet Commonly known as: BENICAR TAKE 1 TABLET BY MOUTH ONCE DAILY   sodium chloride HYPERTONIC 3 % nebulizer solution Take by nebulization in the morning and at bedtime. What changed:   how much to take  when to take this  reasons to take this   tadalafil 20 MG tablet Commonly known as: CIALIS Take 20 mg by mouth daily as needed for erectile dysfunction.   VITAMIN B COMPLEX PO Take 1 tablet by mouth daily.   Vitamin D-3 25 MCG (1000 UT) Caps Take 1,000 Units by mouth daily with breakfast.   VITAMIN E PO Take 1 capsule by mouth daily with breakfast.       Past Surgical History:  He  has a past surgical history that includes Cholecystectomy; Knee arthroscopy; Esophagogastroduodenoscopy; colonscopy; Coronary angioplasty with stent; left heart catheterization with coronary angiogram (08/07/2011); Umbilical hernia repair (N/A, 01/30/2017); Insertion of mesh (N/A, 01/30/2017); Cataract extraction; and Colonoscopy.  Family History:  His family history includes Coronary artery disease in his brother and another family member; Heart disease in his father; Rectal cancer in his brother.  Social History:  He  reports that he quit smoking about 33 years ago.  He has a 50.00 pack-year smoking history. He has never used smokeless tobacco. He reports current alcohol use of about 14.0 standard drinks of alcohol per week. He reports that he does not use drugs.

## 2020-07-09 ENCOUNTER — Other Ambulatory Visit: Payer: Self-pay

## 2020-07-09 ENCOUNTER — Ambulatory Visit (HOSPITAL_COMMUNITY)
Admission: RE | Admit: 2020-07-09 | Discharge: 2020-07-09 | Disposition: A | Discharge status: Home / Self Care | Payer: PPO | Source: Ambulatory Visit | Attending: Surgery | Admitting: Surgery

## 2020-07-09 ENCOUNTER — Ambulatory Visit: Payer: PPO | Admitting: Physician Assistant

## 2020-07-09 VITALS — BP 124/68 | HR 59 | Temp 97.7°F | Resp 20 | Ht 71.0 in | Wt 228.3 lb

## 2020-07-09 DIAGNOSIS — I6521 Occlusion and stenosis of right carotid artery: Secondary | ICD-10-CM | POA: Insufficient documentation

## 2020-07-09 NOTE — Progress Notes (Signed)
Office Note     CC:  follow up Requesting Provider:  Bernerd Limbo, MD  HPI: Tommy Santiago is a 75 y.o. ([birth year]) male who presents for surveillance of carotid artery stenosis.  He was found to have carotid artery stenosis in June 2021 during work-up for syncopal episode.  He does not have any history of CVA or TIA.  Since last office visit 6 months ago he denies any strokelike symptoms including slurring speech, changes in vision, or one-sided weakness.  He is a former smoker.  He is on aspirin and statin daily.   Past Medical History:  Diagnosis Date  . Bronchiectasis ([place])   . Bronchiectasis ([place])   . CAP (community acquired pneumonia)   . Carotid artery occlusion   . Coronary atherosclerosis of unspecified type of vessel, native or graft    status post cardiac cath w percutaneous coronary intervention using XIENCE drug-eluting stent to mid left anterior descending 07/23/08; LHC 08/07/11: Mid LAD stent patent, moderate size diagonal branch jailed by stent with 70% ostial stenosis, unchanged from 2010 with excellent flow down the diagonal branch, mid circumflex 30%, proximal and mid RCA 20%, EF 60%.  Medical therapy continued.   Marland Kitchen GERD (gastroesophageal reflux disease)   . Hepatitis C    Treated [place]  . Hyperlipemia   . Hypertension   . Hypopotassemia   . Lichen planus   . Other and unspecified hyperlipidemia   . Other specified cardiac dysrhythmias(427.89)   . Paroxysmal supraventricular tachycardia ([place])   . Personal history of other infectious and parasitic disease   . Plantar fascial fibromatosis   . Pneumonia   . Syncope and collapse     Past Surgical History:  Procedure Laterality Date  . CATARACT EXTRACTION    . CHOLECYSTECTOMY    . COLONOSCOPY    . colonscopy     status post  . CORONARY ANGIOPLASTY WITH STENT PLACEMENT    . ESOPHAGOGASTRODUODENOSCOPY     status post  . INSERTION OF MESH N/A 01/30/2017   Procedure: INSERTION OF MESH;  Surgeon: Georganna Skeans, MD;  Location: [facility];  Service: General;  Laterality: N/A;  . KNEE ARTHROSCOPY    . LEFT HEART CATHETERIZATION WITH CORONARY ANGIOGRAM  08/07/2011   Procedure: LEFT HEART CATHETERIZATION WITH CORONARY ANGIOGRAM;  Surgeon: Burnell Blanks, MD;  Location: [facility] CATH LAB;  Service: Cardiovascular;;  . UMBILICAL HERNIA REPAIR N/A 01/30/2017   Procedure: REPAIR OF UMBILICAL HERNIA;  Surgeon: Georganna Skeans, MD;  Location: [facility];  Service: General;  Laterality: N/A;    Social History   Socioeconomic History  . Marital status: Married    Spouse name: Not on file  . Number of children: Not on file  . Years of education: Not on file  . Highest education level: Not on file  Occupational History  . Occupation: Critical care RN  Tobacco Use  . Smoking status: Former Smoker    Packs/day: 2.00    Years: 25.00    Pack years: 50.00    Quit date: 1989    Years since quitting: 33.3  . Smokeless tobacco: Never Used  Vaping Use  . Vaping Use: Never used  Substance and Sexual Activity  . Alcohol use: Yes    Alcohol/week: 14.0 standard drinks    Types: 14 Standard drinks or equivalent per week    Comment: social  . Drug use: No    Comment: quit 25 years ago  . Sexual activity: Not on  file  Other Topics Concern  . Not on file  Social History Narrative  . Not on file   Social Determinants of Health   Financial Resource Strain: Not on file  Food Insecurity: Not on file  Transportation Needs: Not on file  Physical Activity: Not on file  Stress: Not on file  Social Connections: Not on file  Intimate Partner Violence: Not on file    Family History  Problem Relation Age of Onset  . Heart disease Father   . Coronary artery disease Brother        diagnosed in early 9s  . Rectal cancer Brother   . Coronary artery disease Other        hx family    Current Outpatient Medications  Medication Sig Dispense Refill  . albuterol (PROVENTIL)  (2.5 MG/3ML) 0.083% nebulizer solution Take 3 mLs (2.5 mg total) by nebulization every 6 (six) hours as needed for wheezing or shortness of breath. 300 mL 10  . aspirin EC 81 MG tablet Take 1 tablet (81 mg total) by mouth daily. (Patient taking differently: Take 81 mg by mouth at bedtime.)    . atorvastatin (LIPITOR) 20 MG tablet TAKE 1 TABLET BY MOUTH DAILY. (Patient taking differently: Take 20 mg by mouth at bedtime.) 90 tablet 3  . B Complex Vitamins (VITAMIN B COMPLEX PO) Take 1 tablet by mouth daily.     Marland Kitchen CALCIUM PO Take 1 tablet by mouth See admin instructions. Take 1 tablet by mouth one to two times a day    . Cholecalciferol (VITAMIN D-3) 25 MCG (1000 UT) CAPS Take 1,000 Units by mouth daily with breakfast.    . dextromethorphan-guaiFENesin (ROBITUSSIN-DM) 10-100 MG/5ML liquid Take 10 mLs by mouth every 6 (six) hours as needed for cough.    . doxycycline (VIBRA-TABS) 100 MG tablet Take 1 tablet (100 mg total) by mouth 2 (two) times daily. 20 tablet 0  . famotidine (PEPCID) 20 MG tablet TAKE 1 TABLET BY MOUTH TWICE DAILY 180 tablet 3  . Fiber POWD Take by mouth See admin instructions. Mix 1 teaspoonful into 4-8 ounces of water and drink once a day as needed for constipation    . ibuprofen (ADVIL) 600 MG tablet Take 1 tablet by mouth every 6 - 8 hours as needed for oral pain. **Take with food** 24 tablet 0  . metoprolol tartrate (LOPRESSOR) 25 MG tablet TAKE 1 TABLET BY MOUTH TWICE DAILY 60 tablet 3  . MISC NATURAL PRODUCTS PO Take 20 mg by mouth See admin instructions. CBD Gummies- Chew 1 gummie (20 mg) by mouth once a day as needed for anxiety and/or sleep    . nitroGLYCERIN (NITROSTAT) 0.4 MG SL tablet Place 1 tablet (0.4 mg total) under the tongue every 5 (five) minutes as needed for chest pain. 25 tablet 6  . olmesartan (BENICAR) 20 MG tablet TAKE 1 TABLET BY MOUTH ONCE DAILY 90 tablet 1  . Respiratory Therapy Supplies (FLUTTER) DEVI Use as directed 1 each 0  . sodium chloride HYPERTONIC  3 % nebulizer solution Take by nebulization in the morning and at bedtime. (Patient taking differently: Take 4 mLs by nebulization as needed (for chest congestion).) 750 mL 10  . tadalafil (CIALIS) 20 MG tablet Take 20 mg by mouth daily as needed for erectile dysfunction.    Marland Kitchen VITAMIN E PO Take 1 capsule by mouth daily with breakfast.     No current facility-administered medications for this visit.    Allergies  Allergen Reactions  .  Amlodipine Swelling and Other (See Comments)    Ankle swelling     REVIEW OF SYSTEMS:   [X]  denotes positive finding, [ ]  denotes negative finding Cardiac  Comments:  Chest pain or chest pressure:    Shortness of breath upon exertion:    Short of breath when lying flat:    Irregular heart rhythm:        Vascular    Pain in calf, thigh, or hip brought on by ambulation:    Pain in feet at night that wakes you up from your sleep:     Blood clot in your veins:    Leg swelling:         Pulmonary    Oxygen at home:    Productive cough:     Wheezing:         Neurologic    Sudden weakness in arms or legs:     Sudden numbness in arms or legs:     Sudden onset of difficulty speaking or slurred speech:    Temporary loss of vision in one eye:     Problems with dizziness:         Gastrointestinal    Blood in stool:     Vomited blood:         Genitourinary    Burning when urinating:     Blood in urine:        Psychiatric    Major depression:         Hematologic    Bleeding problems:    Problems with blood clotting too easily:        Skin    Rashes or ulcers:        Constitutional    Fever or chills:      PHYSICAL EXAMINATION:  Vitals:   07/09/20 1431 07/09/20 1433  BP: 113/65 124/68  Pulse: (!) 59   Resp: 20   Temp: 97.7 F (36.5 C)   TempSrc: Temporal   SpO2: 98%   Weight: 228 lb 4.8 oz (103.6 kg)   Height: 5\' 11"  (1.803 m)     General:  WDWN in NAD; vital signs documented above Gait: Not observed HENT: WNL,  normocephalic Pulmonary: normal non-labored breathing Cardiac: regular HR Abdomen: soft, NT, no masses Skin: without rashes Vascular Exam/Pulses:  Right Left  Radial 2+ (normal) 2+ (normal)   Extremities: without ischemic changes, without Gangrene , without cellulitis; without open wounds;  Musculoskeletal: no muscle wasting or atrophy  Neurologic: A&O X 3 Psychiatric:  The pt has Normal affect.   Non-Invasive Vascular Imaging:   Right ICA 40 to 59% stenosis Left ICA 1 to 39% stenosis   ASSESSMENT/PLAN:: 75 y.o. male here for follow up for carotid artery stenosis  -Subjectively the patient has not had any neurological signs or symptoms since last office visit 6 months ago -Carotid duplex demonstrates right ICA 40 to 59% stenosis and left ICA 1 to 39% stenosis -No indication for revascularization of right ICA currently -Continue aspirin and statin daily -Repeat carotid duplex in 6 months; if velocities are stable at that time we could consider annual surveillance    Dagoberto Ligas, PA-C [facility] MD:   Trula Slade

## 2020-07-11 ENCOUNTER — Other Ambulatory Visit: Payer: Self-pay | Admitting: *Deleted

## 2020-07-11 DIAGNOSIS — I6529 Occlusion and stenosis of unspecified carotid artery: Secondary | ICD-10-CM

## 2020-07-13 ENCOUNTER — Other Ambulatory Visit (HOSPITAL_BASED_OUTPATIENT_CLINIC_OR_DEPARTMENT_OTHER): Payer: Self-pay

## 2020-07-13 MED FILL — Metoprolol Tartrate Tab 25 MG: ORAL | 30 days supply | Qty: 60 | Fill #1 | Status: AC

## 2020-08-01 ENCOUNTER — Other Ambulatory Visit: Payer: Self-pay

## 2020-08-01 ENCOUNTER — Other Ambulatory Visit (HOSPITAL_BASED_OUTPATIENT_CLINIC_OR_DEPARTMENT_OTHER): Payer: Self-pay

## 2020-08-01 MED FILL — Famotidine Tab 20 MG: ORAL | 90 days supply | Qty: 180 | Fill #0 | Status: AC

## 2020-08-01 MED FILL — Atorvastatin Calcium Tab 20 MG (Base Equivalent): ORAL | 90 days supply | Qty: 90 | Fill #0 | Status: AC

## 2020-08-03 ENCOUNTER — Other Ambulatory Visit (HOSPITAL_BASED_OUTPATIENT_CLINIC_OR_DEPARTMENT_OTHER): Payer: Self-pay

## 2020-08-03 MED ORDER — OLMESARTAN MEDOXOMIL 20 MG PO TABS
ORAL_TABLET | ORAL | 1 refills | Status: DC
  Filled 2020-08-03: qty 90, 90d supply, fill #0
  Filled 2020-11-08: qty 90, 90d supply, fill #1

## 2020-08-07 ENCOUNTER — Other Ambulatory Visit (HOSPITAL_BASED_OUTPATIENT_CLINIC_OR_DEPARTMENT_OTHER): Payer: Self-pay

## 2020-08-07 DIAGNOSIS — G4733 Obstructive sleep apnea (adult) (pediatric): Secondary | ICD-10-CM | POA: Diagnosis not present

## 2020-08-07 MED ORDER — METOPROLOL TARTRATE 25 MG PO TABS
ORAL_TABLET | Freq: Two times a day (BID) | ORAL | 3 refills | Status: DC
Start: 2020-08-07 — End: 2021-09-01
  Filled 2020-08-07: qty 180, 90d supply, fill #0
  Filled 2020-10-21: qty 180, 90d supply, fill #1
  Filled 2021-01-09: qty 180, 90d supply, fill #2
  Filled 2021-04-16: qty 180, 90d supply, fill #3

## 2020-08-08 ENCOUNTER — Other Ambulatory Visit (HOSPITAL_BASED_OUTPATIENT_CLINIC_OR_DEPARTMENT_OTHER): Payer: Self-pay

## 2020-08-09 ENCOUNTER — Other Ambulatory Visit (HOSPITAL_BASED_OUTPATIENT_CLINIC_OR_DEPARTMENT_OTHER): Payer: Self-pay

## 2020-08-09 MED ORDER — METOPROLOL TARTRATE 25 MG PO TABS
ORAL_TABLET | ORAL | 1 refills | Status: DC
  Filled 2020-08-09 – 2020-09-16 (×2): qty 90, 45d supply, fill #0

## 2020-09-04 ENCOUNTER — Telehealth: Payer: PPO | Admitting: Adult Health

## 2020-09-04 DIAGNOSIS — Z9989 Dependence on other enabling machines and devices: Secondary | ICD-10-CM | POA: Diagnosis not present

## 2020-09-04 DIAGNOSIS — G4733 Obstructive sleep apnea (adult) (pediatric): Secondary | ICD-10-CM | POA: Diagnosis not present

## 2020-09-04 NOTE — Progress Notes (Signed)
PATIENT: Tommy Santiago DOB: 03-Aug-1945  REASON FOR VISIT: follow up HISTORY FROM: patient PRIMARY NEUROLOGIST:   Virtual Visit via Video Note  I connected with Tommy Santiago on 09/04/20 at  1:00 PM EDT by a video enabled telemedicine application located remotely at [facility] and verified that I am speaking with the correct person using two identifiers who was located at their own home.   I discussed the limitations of evaluation and management by telemedicine and the availability of in person appointments. The patient expressed understanding and agreed to proceed.   PATIENT: Tommy Santiago DOB: 05-24-1945  REASON FOR VISIT: follow up HISTORY FROM: patient Primary neurologist: Dr. Brett Fairy  HISTORY OF PRESENT ILLNESS: Today 09/04/20:  Mr. Carrizales is a 75 year old male with a history of obstructive sleep apnea on CPAP.  He reports that the CPAP is working well for him.  He denies any new issues.  His download is below.    HISTORY 08/08/19    Mr. Llanas is a 75 year old male with a history of obstructive sleep apnea on CPAP.  His download indicates that he uses machine nightly for compliance of 100%.  He uses machine greater than 4 hours each night.  On average he uses his machine 8 hours and 19 minutes.  His residual AHI is 0.7 on 5 to 10 cm of water with EPR 3.  Leak in the 95th percentile is 19.6 L/min.  Reports that the CPAP is working well for him.  He returns today for an evaluation.  REVIEW OF SYSTEMS: Out of a complete 14 system review of symptoms, the patient complains only of the following symptoms, and all other reviewed systems are negative.  ALLERGIES: Allergies  Allergen Reactions   Amlodipine Swelling and Other (See Comments)    Ankle swelling    HOME MEDICATIONS: Outpatient Medications Prior to Visit  Medication Sig Dispense Refill   albuterol (PROVENTIL) (2.5 MG/3ML) 0.083% nebulizer solution Take 3 mLs (2.5 mg total) by  nebulization every 6 (six) hours as needed for wheezing or shortness of breath. 300 mL 10   aspirin EC 81 MG tablet Take 1 tablet (81 mg total) by mouth daily. (Patient taking differently: Take 81 mg by mouth at bedtime.)     atorvastatin (LIPITOR) 20 MG tablet TAKE 1 TABLET BY MOUTH DAILY. (Patient taking differently: Take 20 mg by mouth at bedtime.) 90 tablet 3   B Complex Vitamins (VITAMIN B COMPLEX PO) Take 1 tablet by mouth daily.      CALCIUM PO Take 1 tablet by mouth See admin instructions. Take 1 tablet by mouth one to two times a day     Cholecalciferol (VITAMIN D-3) 25 MCG (1000 UT) CAPS Take 1,000 Units by mouth daily with breakfast.     dextromethorphan-guaiFENesin (ROBITUSSIN-DM) 10-100 MG/5ML liquid Take 10 mLs by mouth every 6 (six) hours as needed for cough.     doxycycline (VIBRA-TABS) 100 MG tablet Take 1 tablet (100 mg total) by mouth 2 (two) times daily. 20 tablet 0   famotidine (PEPCID) 20 MG tablet TAKE 1 TABLET BY MOUTH TWICE DAILY 180 tablet 3   Fiber POWD Take by mouth See admin instructions. Mix 1 teaspoonful into 4-8 ounces of water and drink once a day as needed for constipation     ibuprofen (ADVIL) 600 MG tablet Take 1 tablet by mouth every 6 - 8 hours as needed for oral pain. **Take with food** 24 tablet 0   metoprolol tartrate (  LOPRESSOR) 25 MG tablet TAKE 1 TABLET BY MOUTH TWICE DAILY 180 tablet 3   metoprolol tartrate (LOPRESSOR) 25 MG tablet Take one tablet (25 mg dose) by mouth 2 (two) times daily. 90 tablet 1   MISC NATURAL PRODUCTS PO Take 20 mg by mouth See admin instructions. CBD Gummies- Chew 1 gummie (20 mg) by mouth once a day as needed for anxiety and/or sleep     nitroGLYCERIN (NITROSTAT) 0.4 MG SL tablet Place 1 tablet (0.4 mg total) under the tongue every 5 (five) minutes as needed for chest pain. 25 tablet 6   olmesartan (BENICAR) 20 MG tablet TAKE 1 TABLET BY MOUTH ONCE DAILY 90 tablet 1   olmesartan (BENICAR) 20 MG tablet Take one tablet (20 mg dose)  by mouth daily. 90 tablet 1   Respiratory Therapy Supplies (FLUTTER) DEVI Use as directed 1 each 0   sodium chloride HYPERTONIC 3 % nebulizer solution Take by nebulization in the morning and at bedtime. (Patient taking differently: Take 4 mLs by nebulization as needed (for chest congestion).) 750 mL 10   tadalafil (CIALIS) 20 MG tablet Take 20 mg by mouth daily as needed for erectile dysfunction.     VITAMIN E PO Take 1 capsule by mouth daily with breakfast.     No facility-administered medications prior to visit.    PAST MEDICAL HISTORY: Past Medical History:  Diagnosis Date   Bronchiectasis ([place])    Bronchiectasis ([place])    CAP (community acquired pneumonia)    Carotid artery occlusion    Coronary atherosclerosis of unspecified type of vessel, native or graft    status post cardiac cath w percutaneous coronary intervention using XIENCE drug-eluting stent to mid left anterior descending 07/23/08; LHC 08/07/11: Mid LAD stent patent, moderate size diagonal branch jailed by stent with 70% ostial stenosis, unchanged from 2010 with excellent flow down the diagonal branch, mid circumflex 30%, proximal and mid RCA 20%, EF 60%.  Medical therapy continued.    GERD (gastroesophageal reflux disease)    Hepatitis C    Treated [place]   Hyperlipemia    Hypertension    Hypopotassemia    Lichen planus    Other and unspecified hyperlipidemia    Other specified cardiac dysrhythmias(427.89)    Paroxysmal supraventricular tachycardia ([facility])    Personal history of other infectious and parasitic disease    Plantar fascial fibromatosis    Pneumonia    Syncope and collapse     PAST SURGICAL HISTORY: Past Surgical History:  Procedure Laterality Date   CATARACT EXTRACTION     CHOLECYSTECTOMY     COLONOSCOPY     colonscopy     status post   CORONARY ANGIOPLASTY WITH STENT PLACEMENT     ESOPHAGOGASTRODUODENOSCOPY     status post   INSERTION OF MESH N/A 01/30/2017   Procedure: INSERTION OF MESH;   Surgeon: Georganna Skeans, MD;  Location: [facility];  Service: General;  Laterality: N/A;   KNEE ARTHROSCOPY     LEFT HEART CATHETERIZATION WITH CORONARY ANGIOGRAM  08/07/2011   Procedure: LEFT HEART CATHETERIZATION WITH CORONARY ANGIOGRAM;  Surgeon: Burnell Blanks, MD;  Location[facility] CATH LAB;  Service: Cardiovascular;;   UMBILICAL HERNIA REPAIR N/A 01/30/2017   Procedure: REPAIR OF UMBILICAL HERNIA;  Surgeon: Georganna Skeans, MD;  Location: [facility];  Service: General;  Laterality: N/A;    FAMILY HISTORY: Family History  Problem Relation Age of Onset   Heart disease Father    Coronary artery disease Brother  diagnosed in early 52s   Rectal cancer Brother    Coronary artery disease Other        hx family    SOCIAL HISTORY: Social History   Socioeconomic History   Marital status: Married    Spouse name: Not on file   Number of children: Not on file   Years of education: Not on file   Highest education level: Not on file  Occupational History   Occupation: Critical care RN  Tobacco Use   Smoking status: Former    Packs/day: 2.00    Years: 25.00    Pack years: 50.00    Types: Cigarettes    Quit date: 1989    Years since quitting: 33.5   Smokeless tobacco: Never  Vaping Use   Vaping Use: Never used  Substance and Sexual Activity   Alcohol use: Yes    Alcohol/week: 14.0 standard drinks    Types: 14 Standard drinks or equivalent per week    Comment: social   Drug use: No    Comment: quit 25 years ago   Sexual activity: Not on file  Other Topics Concern   Not on file  Social History Narrative   Not on file   Social Determinants of Health   Financial Resource Strain: Not on file  Food Insecurity: Not on file  Transportation Needs: Not on file  Physical Activity: Not on file  Stress: Not on file  Social Connections: Not on file  Intimate Partner Violence: Not on file      PHYSICAL EXAM Generalized: Well developed,  in no acute distress   Neurological examination  Mentation: Alert oriented to time, place, history taking. Follows all commands speech and language fluent Cranial nerve II-XII:Extraocular movements were full. Facial symmetry noted. uvula tongue midline. Head turning and shoulder shrug  were normal and symmetric. Motor: Good strength throughout subjectively per patient Sensory: Sensory testing is intact to soft touch on all 4 extremities subjectively per patient Coordination: Cerebellar testing reveals good finger-nose-finger  Gait and station: Patient is able to stand from a seated position. gait is normal.  Reflexes: UTA  DIAGNOSTIC DATA (LABS, IMAGING, TESTING) - I reviewed patient records, labs, notes, testing and imaging myself where available.  Lab Results  Component Value Date   WBC 8.7 04/19/2020   HGB 15.1 04/19/2020   HCT 44.2 04/19/2020   MCV 100.5 (H) 04/19/2020   PLT 273 04/19/2020      Component Value Date/Time   NA 134 (L) 04/19/2020 1503   K 4.1 04/19/2020 1503   CL 104 04/19/2020 1503   CO2 19 (L) 04/19/2020 1503   GLUCOSE 109 (H) 04/19/2020 1503   BUN 14 04/19/2020 1503   CREATININE 1.07 04/19/2020 1503   CALCIUM 8.9 04/19/2020 1503   PROT 6.3 (L) 04/19/2020 1503   PROT 6.5 04/19/2018 1018   ALBUMIN 4.0 04/19/2020 1503   ALBUMIN 4.4 04/19/2018 1018   AST 30 04/19/2020 1503   ALT 25 04/19/2020 1503   ALKPHOS 45 04/19/2020 1503   BILITOT 1.1 04/19/2020 1503   BILITOT 0.6 04/19/2018 1018   GFRNONAA >60 04/19/2020 1503   GFRAA >60 09/25/2019 1112   Lab Results  Component Value Date   CHOL 120 04/19/2018   HDL 42 04/19/2018   LDLCALC 63 04/19/2018   TRIG 73 04/19/2018   CHOLHDL 2.9 04/19/2018   No results found for: HGBA1C No results found for: VITAMINB12 Lab Results  Component Value Date   TSH 1.940 04/19/2020  ASSESSMENT AND PLAN 75 y.o. year old male  has a past medical history of Bronchiectasis ([place]), Bronchiectasis ([place]), CAP  (community acquired pneumonia), Carotid artery occlusion, Coronary atherosclerosis of unspecified type of vessel, native or graft, GERD (gastroesophageal reflux disease), Hepatitis C, Hyperlipemia, Hypertension, Hypopotassemia, Lichen planus, Other and unspecified hyperlipidemia, Other specified cardiac dysrhythmias(427.89), Paroxysmal supraventricular tachycardia ([place]), Personal history of other infectious and parasitic disease, Plantar fascial fibromatosis, Pneumonia, and Syncope and collapse. here with:  OSA on CPAP  CPAP compliance excellent Residual AHI is good Encouraged patient to continue using CPAP nightly and > 4 hours each night F/U in 1 year or sooner if needed   Ward Givens, MSN, NP-C 09/04/2020, 1:09 PM [facility] [address] [place], [place] [address] [phone]

## 2020-09-17 ENCOUNTER — Other Ambulatory Visit (HOSPITAL_BASED_OUTPATIENT_CLINIC_OR_DEPARTMENT_OTHER): Payer: Self-pay

## 2020-10-08 ENCOUNTER — Other Ambulatory Visit (HOSPITAL_BASED_OUTPATIENT_CLINIC_OR_DEPARTMENT_OTHER): Payer: Self-pay

## 2020-10-18 ENCOUNTER — Other Ambulatory Visit (HOSPITAL_BASED_OUTPATIENT_CLINIC_OR_DEPARTMENT_OTHER): Payer: Self-pay

## 2020-10-18 DIAGNOSIS — I8500 Esophageal varices without bleeding: Secondary | ICD-10-CM | POA: Diagnosis not present

## 2020-10-18 DIAGNOSIS — R1310 Dysphagia, unspecified: Secondary | ICD-10-CM | POA: Diagnosis not present

## 2020-10-18 DIAGNOSIS — Z13820 Encounter for screening for osteoporosis: Secondary | ICD-10-CM | POA: Diagnosis not present

## 2020-10-18 DIAGNOSIS — Z0000 Encounter for general adult medical examination without abnormal findings: Secondary | ICD-10-CM | POA: Diagnosis not present

## 2020-10-18 DIAGNOSIS — E785 Hyperlipidemia, unspecified: Secondary | ICD-10-CM | POA: Diagnosis not present

## 2020-10-18 MED ORDER — ATORVASTATIN CALCIUM 20 MG PO TABS
ORAL_TABLET | ORAL | 3 refills | Status: DC
  Filled 2020-10-18: qty 90, 90d supply, fill #0
  Filled 2021-01-09: qty 90, 90d supply, fill #1
  Filled 2021-04-16: qty 90, 90d supply, fill #2
  Filled 2021-07-03: qty 90, 90d supply, fill #3

## 2020-10-18 MED ORDER — TADALAFIL 20 MG PO TABS
ORAL_TABLET | ORAL | 5 refills | Status: DC
  Filled 2020-10-18: qty 30, 30d supply, fill #0

## 2020-10-19 ENCOUNTER — Other Ambulatory Visit (HOSPITAL_BASED_OUTPATIENT_CLINIC_OR_DEPARTMENT_OTHER): Payer: Self-pay

## 2020-10-22 ENCOUNTER — Other Ambulatory Visit (HOSPITAL_BASED_OUTPATIENT_CLINIC_OR_DEPARTMENT_OTHER): Payer: Self-pay

## 2020-11-07 DIAGNOSIS — G4733 Obstructive sleep apnea (adult) (pediatric): Secondary | ICD-10-CM | POA: Diagnosis not present

## 2020-11-08 DIAGNOSIS — Z23 Encounter for immunization: Secondary | ICD-10-CM | POA: Diagnosis not present

## 2020-11-09 ENCOUNTER — Other Ambulatory Visit (HOSPITAL_BASED_OUTPATIENT_CLINIC_OR_DEPARTMENT_OTHER): Payer: Self-pay

## 2020-11-27 DIAGNOSIS — Z13820 Encounter for screening for osteoporosis: Secondary | ICD-10-CM | POA: Diagnosis not present

## 2020-11-27 DIAGNOSIS — M85852 Other specified disorders of bone density and structure, left thigh: Secondary | ICD-10-CM | POA: Diagnosis not present

## 2020-12-12 DIAGNOSIS — H0102A Squamous blepharitis right eye, upper and lower eyelids: Secondary | ICD-10-CM | POA: Diagnosis not present

## 2020-12-12 DIAGNOSIS — H0102B Squamous blepharitis left eye, upper and lower eyelids: Secondary | ICD-10-CM | POA: Diagnosis not present

## 2020-12-12 DIAGNOSIS — H43811 Vitreous degeneration, right eye: Secondary | ICD-10-CM | POA: Diagnosis not present

## 2020-12-12 DIAGNOSIS — Z961 Presence of intraocular lens: Secondary | ICD-10-CM | POA: Diagnosis not present

## 2020-12-12 DIAGNOSIS — H26491 Other secondary cataract, right eye: Secondary | ICD-10-CM | POA: Diagnosis not present

## 2020-12-25 DIAGNOSIS — D2361 Other benign neoplasm of skin of right upper limb, including shoulder: Secondary | ICD-10-CM | POA: Diagnosis not present

## 2020-12-25 DIAGNOSIS — L814 Other melanin hyperpigmentation: Secondary | ICD-10-CM | POA: Diagnosis not present

## 2020-12-25 DIAGNOSIS — L821 Other seborrheic keratosis: Secondary | ICD-10-CM | POA: Diagnosis not present

## 2020-12-25 DIAGNOSIS — L570 Actinic keratosis: Secondary | ICD-10-CM | POA: Diagnosis not present

## 2021-01-09 ENCOUNTER — Other Ambulatory Visit: Payer: Self-pay

## 2021-01-09 ENCOUNTER — Other Ambulatory Visit (HOSPITAL_BASED_OUTPATIENT_CLINIC_OR_DEPARTMENT_OTHER): Payer: Self-pay

## 2021-01-09 ENCOUNTER — Other Ambulatory Visit (HOSPITAL_COMMUNITY): Payer: Self-pay

## 2021-01-09 DIAGNOSIS — Z86010 Personal history of colonic polyps: Secondary | ICD-10-CM | POA: Diagnosis not present

## 2021-01-09 DIAGNOSIS — K7469 Other cirrhosis of liver: Secondary | ICD-10-CM | POA: Diagnosis not present

## 2021-01-09 MED ORDER — FAMOTIDINE 20 MG PO TABS
20.0000 mg | ORAL_TABLET | Freq: Two times a day (BID) | ORAL | 3 refills | Status: DC
  Filled 2021-01-09: qty 180, 90d supply, fill #0

## 2021-01-09 MED ORDER — FAMOTIDINE 20 MG PO TABS
20.0000 mg | ORAL_TABLET | Freq: Two times a day (BID) | ORAL | 3 refills | Status: DC
  Filled 2021-01-09: qty 180, 90d supply, fill #0
  Filled 2021-04-16: qty 180, 90d supply, fill #1
  Filled 2021-07-03: qty 180, 90d supply, fill #2
  Filled 2021-10-06: qty 180, 90d supply, fill #3

## 2021-01-09 MED ORDER — OLMESARTAN MEDOXOMIL 20 MG PO TABS
ORAL_TABLET | ORAL | 1 refills | Status: DC
  Filled 2021-01-09 – 2021-01-15 (×3): qty 90, 90d supply, fill #0
  Filled 2021-04-16: qty 90, 90d supply, fill #1

## 2021-01-10 ENCOUNTER — Other Ambulatory Visit (HOSPITAL_BASED_OUTPATIENT_CLINIC_OR_DEPARTMENT_OTHER): Payer: Self-pay

## 2021-01-15 ENCOUNTER — Other Ambulatory Visit (HOSPITAL_BASED_OUTPATIENT_CLINIC_OR_DEPARTMENT_OTHER): Payer: Self-pay

## 2021-01-23 ENCOUNTER — Other Ambulatory Visit (HOSPITAL_BASED_OUTPATIENT_CLINIC_OR_DEPARTMENT_OTHER): Payer: Self-pay

## 2021-01-28 DIAGNOSIS — K7689 Other specified diseases of liver: Secondary | ICD-10-CM | POA: Diagnosis not present

## 2021-01-28 DIAGNOSIS — K7460 Unspecified cirrhosis of liver: Secondary | ICD-10-CM | POA: Diagnosis not present

## 2021-02-04 ENCOUNTER — Other Ambulatory Visit (HOSPITAL_COMMUNITY): Payer: Self-pay | Admitting: Internal Medicine

## 2021-02-04 ENCOUNTER — Encounter (HOSPITAL_COMMUNITY): Payer: Self-pay | Admitting: Radiology

## 2021-02-04 DIAGNOSIS — K7469 Other cirrhosis of liver: Secondary | ICD-10-CM

## 2021-02-04 DIAGNOSIS — K769 Liver disease, unspecified: Secondary | ICD-10-CM

## 2021-02-04 NOTE — Progress Notes (Signed)
Patient Name  Ibrahim, Mcpheeters Legal Sex  Male DOB  12-15-1945 SSN  [id number] Address  [place] [address] Phone  [phone] [facility]) *Preferred*  [phone] (Mobile)    RE: US BIOPSY (LIVER) Received: Today Sandi Mariscal, MD  Garth Bigness D OK for US guided liver lesion Bx   Abdominal MRI - image 32, series 19.   (LI-RADS 4, with only mild elevation of AFP at 22).   Cathren Harsh        Previous Messages   ----- Message -----  From: Garth Bigness D  Sent: 02/04/2021  10:29 AM EST  To: [facility] Procedure Requests  Subject: US BIOPSY (LIVER)                               Procedure:  US BIOPSY (LIVER)   Reason:   Liver lesion,  Post-necrotic cirrhosis   History:  outside MR in Pac's   Provider:  Rolene Course   Provider Contact:  [phone]

## 2021-02-11 ENCOUNTER — Ambulatory Visit (HOSPITAL_COMMUNITY)
Admission: RE | Admit: 2021-02-11 | Discharge: 2021-02-11 | Disposition: A | Discharge status: Home / Self Care | Payer: PPO | Source: Ambulatory Visit | Attending: Physician Assistant | Admitting: Physician Assistant

## 2021-02-11 ENCOUNTER — Ambulatory Visit: Payer: PPO | Admitting: Physician Assistant

## 2021-02-11 ENCOUNTER — Other Ambulatory Visit: Payer: Self-pay

## 2021-02-11 VITALS — BP 128/74 | HR 45 | Temp 97.9°F | Resp 20 | Ht 71.0 in | Wt 244.0 lb

## 2021-02-11 DIAGNOSIS — I6529 Occlusion and stenosis of unspecified carotid artery: Secondary | ICD-10-CM | POA: Diagnosis not present

## 2021-02-11 NOTE — Progress Notes (Signed)
HISTORY AND PHYSICAL     CC:  follow up. Requesting Provider:  Bernerd Limbo, MD  HPI: This is a 75 y.o. male here for follow up for carotid artery stenosis.  He was found to have carotid artery stenosis in June 2021 during work-up for syncopal episode.  He does not have any history of CVA or TIA.  Pt was last seen May 2022 and at that time he was doing well without stroke sx.    Pt returns today for follow up.    Pt denies any amaurosis fugax, speech difficulties, weakness, numbness, paralysis or clumsiness or facial droop.   He states that his asa is on hold currently as he is having a liver biopsy on Thursday for an area they have been watching and has enlarged.     The pt is on a statin for cholesterol management.  The pt is on a daily aspirin.  (See above) Other AC:  none The pt is on BB, ARB for hypertension.   The pt is not diabetic.   Tobacco hx:  former  Pt does not have family hx of AAA.  Past Medical History:  Diagnosis Date   Bronchiectasis ([place])    Bronchiectasis ([place])    CAP (community acquired pneumonia)    Carotid artery occlusion    Coronary atherosclerosis of unspecified type of vessel, native or graft    status post cardiac cath w percutaneous coronary intervention using XIENCE drug-eluting stent to mid left anterior descending 07/23/08; LHC 08/07/11: Mid LAD stent patent, moderate size diagonal branch jailed by stent with 70% ostial stenosis, unchanged from 2010 with excellent flow down the diagonal branch, mid circumflex 30%, proximal and mid RCA 20%, EF 60%.  Medical therapy continued.    GERD (gastroesophageal reflux disease)    Hepatitis C    Treated [place]   Hyperlipemia    Hypertension    Hypopotassemia    Lichen planus    Other and unspecified hyperlipidemia    Other specified cardiac dysrhythmias(427.89)    Paroxysmal supraventricular tachycardia (HCC)    Personal history of other infectious and parasitic disease    Plantar fascial fibromatosis     Pneumonia    Syncope and collapse     Past Surgical History:  Procedure Laterality Date   CATARACT EXTRACTION     CHOLECYSTECTOMY     COLONOSCOPY     colonscopy     status post   CORONARY ANGIOPLASTY WITH STENT PLACEMENT     ESOPHAGOGASTRODUODENOSCOPY     status post   INSERTION OF MESH N/A 01/30/2017   Procedure: INSERTION OF MESH;  Surgeon: Georganna Skeans, MD;  Location: [facility];  Service: General;  Laterality: N/A;   KNEE ARTHROSCOPY     LEFT HEART CATHETERIZATION WITH CORONARY ANGIOGRAM  08/07/2011   Procedure: LEFT HEART CATHETERIZATION WITH CORONARY ANGIOGRAM;  Surgeon: Burnell Blanks, MD;  Location: [facility] CATH LAB;  Service: Cardiovascular;;   UMBILICAL HERNIA REPAIR N/A 01/30/2017   Procedure: REPAIR OF UMBILICAL HERNIA;  Surgeon: Georganna Skeans, MD;  Location: [facility];  Service: General;  Laterality: N/A;    Allergies  Allergen Reactions   Amlodipine Swelling and Other (See Comments)    Ankle swelling    Current Outpatient Medications  Medication Sig Dispense Refill   albuterol (PROVENTIL) (2.5 MG/3ML) 0.083% nebulizer solution Take 3 mLs (2.5 mg total) by nebulization every 6 (six) hours as needed for wheezing or shortness of breath. 300 mL 10   aspirin  EC 81 MG tablet Take 1 tablet (81 mg total) by mouth daily. (Patient taking differently: Take 81 mg by mouth at bedtime.)     atorvastatin (LIPITOR) 20 MG tablet Take one tablet (20 mg dose) by mouth daily. 90 tablet 3   B Complex Vitamins (VITAMIN B COMPLEX PO) Take 1 tablet by mouth daily.      CALCIUM PO Take 1 tablet by mouth See admin instructions. Take 1 tablet by mouth one to two times a day     Cholecalciferol (VITAMIN D-3) 25 MCG (1000 UT) CAPS Take 1,000 Units by mouth daily with breakfast.     dextromethorphan-guaiFENesin (ROBITUSSIN-DM) 10-100 MG/5ML liquid Take 10 mLs by mouth every 6 (six) hours as needed for cough.     doxycycline (VIBRA-TABS) 100 MG tablet Take  1 tablet (100 mg total) by mouth 2 (two) times daily. 20 tablet 0   famotidine (PEPCID) 20 MG tablet TAKE 1 TABLET BY MOUTH TWICE DAILY 180 tablet 3   famotidine (PEPCID) 20 MG tablet Take 1 tablet (20 mg total) by mouth 2 (two) times daily. 180 tablet 3   famotidine (PEPCID) 20 MG tablet Take 1 tablet (20 mg total) by mouth 2 (two) times daily. 180 tablet 3   Fiber POWD Take by mouth See admin instructions. Mix 1 teaspoonful into 4-8 ounces of water and drink once a day as needed for constipation     ibuprofen (ADVIL) 600 MG tablet Take 1 tablet by mouth every 6 - 8 hours as needed for oral pain. **Take with food** 24 tablet 0   metoprolol tartrate (LOPRESSOR) 25 MG tablet TAKE 1 TABLET BY MOUTH TWICE DAILY 180 tablet 3   metoprolol tartrate (LOPRESSOR) 25 MG tablet Take one tablet (25 mg dose) by mouth 2 (two) times daily. 90 tablet 1   MISC NATURAL PRODUCTS PO Take 20 mg by mouth See admin instructions. CBD Gummies- Chew 1 gummie (20 mg) by mouth once a day as needed for anxiety and/or sleep     nitroGLYCERIN (NITROSTAT) 0.4 MG SL tablet Place 1 tablet (0.4 mg total) under the tongue every 5 (five) minutes as needed for chest pain. 25 tablet 6   olmesartan (BENICAR) 20 MG tablet TAKE 1 TABLET BY MOUTH ONCE DAILY 90 tablet 1   olmesartan (BENICAR) 20 MG tablet Take one tablet (20 mg dose) by mouth daily. 90 tablet 1   Respiratory Therapy Supplies (FLUTTER) DEVI Use as directed 1 each 0   sodium chloride HYPERTONIC 3 % nebulizer solution Take by nebulization in the morning and at bedtime. (Patient taking differently: Take 4 mLs by nebulization as needed (for chest congestion).) 750 mL 10   tadalafil (CIALIS) 20 MG tablet Take 20 mg by mouth daily as needed for erectile dysfunction.     tadalafil (CIALIS) 20 MG tablet Take one tablet (20 mg dose) by mouth daily as needed. 30 tablet 5   VITAMIN E PO Take 1 capsule by mouth daily with breakfast.     No current facility-administered medications for  this visit.    Family History  Problem Relation Age of Onset   Heart disease Father    Coronary artery disease Brother        diagnosed in early 95s   Rectal cancer Brother    Coronary artery disease Other        hx family    Social History   Socioeconomic History   Marital status: Married    Spouse name: Not on file  Number of children: Not on file   Years of education: Not on file   Highest education level: Not on file  Occupational History   Occupation: Critical care RN  Tobacco Use   Smoking status: Former    Packs/day: 2.00    Years: 25.00    Pack years: 50.00    Types: Cigarettes    Quit date: 69    Years since quitting: 33.9   Smokeless tobacco: Never  Vaping Use   Vaping Use: Never used  Substance and Sexual Activity   Alcohol use: Yes    Alcohol/week: 14.0 standard drinks    Types: 14 Standard drinks or equivalent per week    Comment: social   Drug use: No    Comment: quit 25 years ago   Sexual activity: Not on file  Other Topics Concern   Not on file  Social History Narrative   Not on file   Social Determinants of Health   Financial Resource Strain: Not on file  Food Insecurity: Not on file  Transportation Needs: Not on file  Physical Activity: Not on file  Stress: Not on file  Social Connections: Not on file  Intimate Partner Violence: Not on file     REVIEW OF SYSTEMS:   [X]  denotes positive finding, [ ]  denotes negative finding Cardiac  Comments:  Chest pain or chest pressure:    Shortness of breath upon exertion:    Short of breath when lying flat:    Irregular heart rhythm:        Vascular    Pain in calf, thigh, or hip brought on by ambulation:    Pain in feet at night that wakes you up from your sleep:     Blood clot in your veins:    Leg swelling:         Pulmonary    Oxygen at home:    Productive cough:     Wheezing:         Neurologic    Sudden weakness in arms or legs:     Sudden numbness in arms or legs:      Sudden onset of difficulty speaking or slurred speech:    Temporary loss of vision in one eye:     Problems with dizziness:         Gastrointestinal    Blood in stool:     Vomited blood:         Genitourinary    Burning when urinating:     Blood in urine:        Psychiatric    Major depression:         Hematologic    Bleeding problems:    Problems with blood clotting too easily:        Skin    Rashes or ulcers:        Constitutional    Fever or chills:      PHYSICAL EXAMINATION:  Today's Vitals   02/11/21 0840 02/11/21 0844  BP: 134/73 128/74  Pulse: (!) 45   Resp: 20   Temp: 97.9 F (36.6 C)   SpO2: 100%   Weight: 244 lb (110.7 kg)   Height: 5\' 11"  (1.803 m)    Body mass index is 34.03 kg/m.   General:  WDWN in NAD; vital signs documented above Gait: Not observed HENT: WNL, normocephalic Pulmonary: normal non-labored breathing Cardiac: regular HR, without carotid bruits Abdomen: soft, NT; aortic pulse is not palpable Skin: without rashes Vascular Exam/Pulses:  Right Left  Radial 2+ (normal) 2+ (normal)  DP 2+ (normal) Unable to palpate  PT Unable to palpate Unable to palpate   Extremities: without ischemic changes, without Gangrene , without cellulitis; without open wounds; bilateral feet are warm and well perfused. Musculoskeletal: no muscle wasting or atrophy  Neurologic: A&O X 3; moving all extremities equally; speech is fluent/normal Psychiatric:  The pt has Normal affect.   Non-Invasive Vascular Imaging:   Carotid Duplex on 02/11/2021: Right:  60-79% ICA stenosis Left:  1-39% ICA stenosis Vertebrals: Bilateral vertebral arteries demonstrate antegrade flow.  Previous Carotid duplex on 07/09/2020: Right: 40-59% ICA stenosis Left:   1-39% ICA stenosis    ASSESSMENT/PLAN:: 75 y.o. male here for follow up carotid artery stenosis    -duplex today reveals he is in the lower end of the 60-79% range for right ICA stenosis.  He remains  asymptomatic.   -discussed s/s of stroke with pt and he understands should he develop any of these sx, he will go to the nearest ER or call 911. -pt will f/u in 6 months with carotid duplex -pt will call sooner should they have any issues. -continue statin/asa    Leontine Locket, [facility] [phone]  Clinic MD:  Scot Dock

## 2021-02-12 ENCOUNTER — Other Ambulatory Visit: Payer: Self-pay

## 2021-02-12 DIAGNOSIS — I6529 Occlusion and stenosis of unspecified carotid artery: Secondary | ICD-10-CM

## 2021-02-13 ENCOUNTER — Other Ambulatory Visit: Payer: Self-pay | Admitting: Radiology

## 2021-02-13 ENCOUNTER — Other Ambulatory Visit: Payer: Self-pay | Admitting: Student

## 2021-02-14 ENCOUNTER — Other Ambulatory Visit: Payer: Self-pay

## 2021-02-14 ENCOUNTER — Ambulatory Visit (HOSPITAL_COMMUNITY)
Admission: RE | Admit: 2021-02-14 | Discharge: 2021-02-14 | Disposition: A | Discharge status: Home / Self Care | Payer: PPO | Source: Ambulatory Visit | Attending: Internal Medicine | Admitting: Internal Medicine

## 2021-02-14 DIAGNOSIS — J479 Bronchiectasis, uncomplicated: Secondary | ICD-10-CM | POA: Insufficient documentation

## 2021-02-14 DIAGNOSIS — E785 Hyperlipidemia, unspecified: Secondary | ICD-10-CM | POA: Diagnosis not present

## 2021-02-14 DIAGNOSIS — I2510 Atherosclerotic heart disease of native coronary artery without angina pectoris: Secondary | ICD-10-CM | POA: Diagnosis not present

## 2021-02-14 DIAGNOSIS — K769 Liver disease, unspecified: Secondary | ICD-10-CM | POA: Diagnosis present

## 2021-02-14 DIAGNOSIS — K219 Gastro-esophageal reflux disease without esophagitis: Secondary | ICD-10-CM | POA: Insufficient documentation

## 2021-02-14 DIAGNOSIS — C220 Liver cell carcinoma: Secondary | ICD-10-CM | POA: Diagnosis not present

## 2021-02-14 DIAGNOSIS — K7469 Other cirrhosis of liver: Secondary | ICD-10-CM

## 2021-02-14 DIAGNOSIS — I10 Essential (primary) hypertension: Secondary | ICD-10-CM | POA: Insufficient documentation

## 2021-02-14 DIAGNOSIS — B1920 Unspecified viral hepatitis C without hepatic coma: Secondary | ICD-10-CM | POA: Diagnosis not present

## 2021-02-14 DIAGNOSIS — K7460 Unspecified cirrhosis of liver: Secondary | ICD-10-CM | POA: Diagnosis not present

## 2021-02-14 DIAGNOSIS — I6529 Occlusion and stenosis of unspecified carotid artery: Secondary | ICD-10-CM | POA: Diagnosis not present

## 2021-02-14 LAB — PROTIME-INR
INR: 1.1 (ref 0.8–1.2)
Prothrombin Time: 13.8 seconds (ref 11.4–15.2)

## 2021-02-14 LAB — CBC
HCT: 43.4 % (ref 39.0–52.0)
Hemoglobin: 14.7 g/dL (ref 13.0–17.0)
MCH: 33.8 pg (ref 26.0–34.0)
MCHC: 33.9 g/dL (ref 30.0–36.0)
MCV: 99.8 fL (ref 80.0–100.0)
Platelets: 241 10*3/uL (ref 150–400)
RBC: 4.35 MIL/uL (ref 4.22–5.81)
RDW: 11.7 % (ref 11.5–15.5)
WBC: 6.4 10*3/uL (ref 4.0–10.5)
nRBC: 0 % (ref 0.0–0.2)

## 2021-02-14 MED ORDER — FENTANYL CITRATE (PF) 100 MCG/2ML IJ SOLN
INTRAMUSCULAR | Status: AC | PRN
Start: 2021-02-14 — End: 2021-02-14
  Administered 2021-02-14 (×2): 50 ug via INTRAVENOUS

## 2021-02-14 MED ORDER — LIDOCAINE HCL (PF) 1 % IJ SOLN
INTRAMUSCULAR | Status: AC
  Filled 2021-02-14: qty 30

## 2021-02-14 MED ORDER — FENTANYL CITRATE (PF) 100 MCG/2ML IJ SOLN
INTRAMUSCULAR | Status: AC
  Filled 2021-02-14: qty 4

## 2021-02-14 MED ORDER — MIDAZOLAM HCL 2 MG/2ML IJ SOLN
INTRAMUSCULAR | Status: AC
  Filled 2021-02-14: qty 4

## 2021-02-14 MED ORDER — GELATIN ABSORBABLE 12-7 MM EX MISC
CUTANEOUS | Status: AC
  Filled 2021-02-14: qty 1

## 2021-02-14 MED ORDER — SODIUM CHLORIDE 0.9 % IV SOLN: INTRAVENOUS | Status: DC

## 2021-02-14 MED ORDER — MIDAZOLAM HCL 2 MG/2ML IJ SOLN
INTRAMUSCULAR | Status: AC | PRN
  Administered 2021-02-14 (×2): 1 mg via INTRAVENOUS

## 2021-02-14 NOTE — Consult Note (Signed)
Chief Complaint: Patient was seen in consultation today for  image guided liver lesion biopsy  Referring Physician(s): [place]  Supervising Physician: Jacqulynn Cadet  Patient Status: [facility] - Out-pt  History of Present Illness: Tommy Santiago is a 75 y.o. male with PMH sig for carotid artery occlusion, CAD, bronchiectasis, GERD, hep C,HTN,HLD, cirrhosis by imaging and MRI done on 01/29/21 at [facility] which revealed:  1.5 cm hypervascular lesion in segment 3 of the left lobe, with  probable slight increase in size since previous study. (LI-RADS  Category 4: Probably [place]). Consider biopsy versus continued imaging  follow-up in 6 months.   Two tiny sub-cm foci of arterial phase hyperenhancement in segment 2  of the left lobe. These are too small to characterize, but were not  definitely visualized on prior exam. (LI-RADS Category 3:  Intermediate probability of malignancy). Recommend continued  attention on follow-up imaging.   Hepatic cirrhosis.     Pt presents today for image guided liver lesion biopsy for further evaluation. AFP of 22.   Past Medical History:  Diagnosis Date   Bronchiectasis ([place])    Bronchiectasis ([place])    CAP (community acquired pneumonia)    Carotid artery occlusion    Coronary atherosclerosis of unspecified type of vessel, native or graft    status post cardiac cath w percutaneous coronary intervention using XIENCE drug-eluting stent to mid left anterior descending 07/23/08; LHC 08/07/11: Mid LAD stent patent, moderate size diagonal branch jailed by stent with 70% ostial stenosis, unchanged from 2010 with excellent flow down the diagonal branch, mid circumflex 30%, proximal and mid RCA 20%, EF 60%.  Medical therapy continued.    GERD (gastroesophageal reflux disease)    Hepatitis C    Treated [place]   Hyperlipemia    Hypertension    Hypopotassemia    Lichen planus    Other and unspecified hyperlipidemia    Other specified  cardiac dysrhythmias(427.89)    Paroxysmal supraventricular tachycardia (HCC)    Personal history of other infectious and parasitic disease    Plantar fascial fibromatosis    Pneumonia    Syncope and collapse     Past Surgical History:  Procedure Laterality Date   CATARACT EXTRACTION     CHOLECYSTECTOMY     COLONOSCOPY     colonscopy     status post   CORONARY ANGIOPLASTY WITH STENT PLACEMENT     ESOPHAGOGASTRODUODENOSCOPY     status post   INSERTION OF MESH N/A 01/30/2017   Procedure: INSERTION OF MESH;  Surgeon: Georganna Skeans, MD;  Location: [place];  Service: General;  Laterality: N/A;   KNEE ARTHROSCOPY     LEFT HEART CATHETERIZATION WITH CORONARY ANGIOGRAM  08/07/2011   Procedure: LEFT HEART CATHETERIZATION WITH CORONARY ANGIOGRAM;  Surgeon: Burnell Blanks, MD;  Location: [facility] CATH LAB;  Service: Cardiovascular;;   UMBILICAL HERNIA REPAIR N/A 01/30/2017   Procedure: REPAIR OF UMBILICAL HERNIA;  Surgeon: Georganna Skeans, MD;  Location: [place];  Service: General;  Laterality: N/A;    Allergies: Amlodipine  Medications: Prior to Admission medications   Medication Sig Start Date End Date Taking? Authorizing Provider  albuterol (PROVENTIL) (2.5 MG/3ML) 0.083% nebulizer solution Take 3 mLs (2.5 mg total) by nebulization every 6 (six) hours as needed for wheezing or shortness of breath. 06/14/19  Yes Chesley Mires, MD  aspirin EC 81 MG tablet Take 1 tablet (81 mg total) by mouth daily. Patient taking differently: Take 81 mg by mouth at  bedtime. 06/24/17  Yes Imogene Burn, PA-C  atorvastatin (LIPITOR) 20 MG tablet Take one tablet (20 mg dose) by mouth daily. 10/18/20  Yes   B Complex-C (SUPER B COMPLEX PO) Take 1 tablet by mouth daily.   Yes [provider]  Calcium Carbonate-Vitamin D (CALCIUM-D PO) Take 1-1.5 tablets by mouth See admin instructions. Take 1 tablet in the morning and 1.5 tablets at night   Yes [provider]  Cholecalciferol (VITAMIN D) 50 MCG (2000 UT) tablet Take 2,000 Units by mouth daily.   Yes [provider]  dextromethorphan-guaiFENesin (ROBITUSSIN-DM) 10-100 MG/5ML liquid Take 10 mLs by mouth every 6 (six) hours as needed for cough.   Yes [provider]  diphenhydrAMINE (BENADRYL) 25 MG tablet Take 25 mg by mouth daily as needed for allergies.   Yes [provider]  famotidine (PEPCID) 20 MG tablet Take 1 tablet (20 mg total) by mouth 2 (two) times daily. 01/09/21  Yes   Fiber POWD Take 1 Scoop by mouth daily as needed (constipation).   Yes [provider]  metoprolol tartrate (LOPRESSOR) 25 MG tablet TAKE 1 TABLET BY MOUTH TWICE DAILY 08/07/20  Yes Burnell Blanks, MD  MISC NATURAL PRODUCTS PO Take 7 mg by mouth daily. CBD Gummies   Yes [provider]  nitroGLYCERIN (NITROSTAT) 0.4 MG SL tablet Place 1 tablet (0.4 mg total) under the tongue every 5 (five) minutes as needed for chest pain. 07/20/19  Yes Burnell Blanks, MD  olmesartan (BENICAR) 20 MG tablet Take one tablet (20 mg dose) by mouth daily. 01/09/21  Yes   Respiratory Therapy Supplies (FLUTTER) DEVI Use as directed 06/14/19  Yes Sood, Elisabeth Cara, MD  sodium chloride HYPERTONIC 3 % nebulizer solution Take by nebulization in the morning and at bedtime. Patient taking differently: Take 4 mLs by nebulization as needed (for chest congestion). 06/14/19  Yes Chesley Mires, MD  tadalafil (CIALIS) 20 MG tablet Take one tablet (20 mg dose) by mouth daily as needed. 10/18/20  Yes   Tetrahydrozoline HCl (VISINE OP) Place 1 drop into both eyes daily as needed (dry eyes).   Yes [provider]  vitamin E 180 MG (400 UNITS) capsule Take 400 Units by mouth daily.   Yes [provider]  famotidine (PEPCID) 20 MG tablet TAKE 1 TABLET BY MOUTH TWICE DAILY Patient not taking: Reported on 02/11/2021 08/25/19 02/11/21  Rolene Course, PA-C  famotidine (PEPCID) 20 MG tablet Take 1  tablet (20 mg total) by mouth 2 (two) times daily. Patient not taking: Reported on 02/11/2021 01/09/21        Family History  Problem Relation Age of Onset   Heart disease Father    Coronary artery disease Brother        diagnosed in early 4s   Rectal cancer Brother    Coronary artery disease Other        hx family    Social History   Socioeconomic History   Marital status: Married    Spouse name: Not on file   Number of children: Not on file   Years of education: Not on file   Highest education level: Not on file  Occupational History   Occupation: Critical care RN  Tobacco Use   Smoking status: Former    Packs/day: 2.00    Years: 25.00    Pack years: 50.00    Types: Cigarettes    Quit date: 1989    Years since quitting: 74.9  Smokeless tobacco: Never  Vaping Use   Vaping Use: Never used  Substance and Sexual Activity   Alcohol use: Yes    Alcohol/week: 14.0 standard drinks    Types: 14 Standard drinks or equivalent per week    Comment: social   Drug use: No    Comment: quit 25 years ago   Sexual activity: Not on file  Other Topics Concern   Not on file  Social History Narrative   Not on file   Social Determinants of Health   Financial Resource Strain: Not on file  Food Insecurity: Not on file  Transportation Needs: Not on file  Physical Activity: Not on file  Stress: Not on file  Social Connections: Not on file      Review of Systems denies fever,HA,CP,dyspnea, cough, back pain,N/V or bleeding; he does have some occ abd cramping  Vital Signs: BP (!) 148/81    Pulse (!) 53    Temp 98 F (36.7 C) (Oral)    Resp 18    Ht 5\' 11"  (1.803 m)    Wt 240 lb (108.9 kg)    SpO2 98%    BMI 33.47 kg/m   Physical Exam awake/alert; chest- dim BS bases; heart- sl bradycardic but reg rhythm; abd- protuberant , soft,+BS,NT; no LE edema  Imaging: VAS US CAROTID  Result Date: 02/11/2021 Carotid Arterial Duplex Study Patient Name:  Tommy Santiago  Date of Exam:    02/11/2021 Medical Rec #: [id number]        Accession #:    [id number] Date of Birth: 04-Feb-1946        Patient Gender: M Patient Age:   8 years Exam Location:  [facility] Procedure:      VAS US CAROTID Referring Phys: Dagoberto Ligas --------------------------------------------------------------------------------  Indications:       Carotid artery disease. Risk Factors:      Hypertension, hyperlipidemia, coronary artery disease. Comparison Study:  No significant change since prior exam of 01/09/2020 Performing Technologist: Alvia Grove RVT  Examination Guidelines: A complete evaluation includes B-mode imaging, spectral Doppler, color Doppler, and power Doppler as needed of all accessible portions of each vessel. Bilateral testing is considered an integral part of a complete examination. Limited examinations for reoccurring indications may be performed as noted.  Right Carotid Findings: +----------+--------+--------+--------+------------------+--------+             PSV cm/s EDV cm/s Stenosis Plaque Description Comments  +----------+--------+--------+--------+------------------+--------+  CCA Prox   97       14                homogeneous                  +----------+--------+--------+--------+------------------+--------+  CCA Mid    107      19                homogeneous                  +----------+--------+--------+--------+------------------+--------+  CCA Distal 72       15                homogeneous                  +----------+--------+--------+--------+------------------+--------+  ICA Prox   290      72       60-79%   heterogenous                 +----------+--------+--------+--------+------------------+--------+  ICA  Mid    83       22                                             +----------+--------+--------+--------+------------------+--------+  ICA Distal 76       20                                             +----------+--------+--------+--------+------------------+--------+  ECA         143      15                heterogenous                 +----------+--------+--------+--------+------------------+--------+ +----------+--------+-------+----------------+-------------------+             PSV cm/s EDV cms Describe         Arm Pressure (mmHG)  +----------+--------+-------+----------------+-------------------+  Subclavian 187      4       Multiphasic, WNL                      +----------+--------+-------+----------------+-------------------+ +---------+--------+--+--------+-+---------+  Vertebral PSV cm/s 41 EDV cm/s 8 Antegrade  +---------+--------+--+--------+-+---------+  Left Carotid Findings: +----------+--------+--------+--------+------------------+--------+             PSV cm/s EDV cm/s Stenosis Plaque Description Comments  +----------+--------+--------+--------+------------------+--------+  CCA Prox   111      23                heterogenous                 +----------+--------+--------+--------+------------------+--------+  CCA Mid    89       20                heterogenous                 +----------+--------+--------+--------+------------------+--------+  CCA Distal 103      25                heterogenous                 +----------+--------+--------+--------+------------------+--------+  ICA Prox   126      40       1-39%    heterogenous                 +----------+--------+--------+--------+------------------+--------+  ICA Mid    118      36                                             +----------+--------+--------+--------+------------------+--------+  ICA Distal 137      34                                             +----------+--------+--------+--------+------------------+--------+  ECA        130      12                heterogenous                 +----------+--------+--------+--------+------------------+--------+ +----------+--------+--------+----------------+-------------------+  PSV cm/s EDV cm/s Describe         Arm Pressure (mmHG)   +----------+--------+--------+----------------+-------------------+  Subclavian 127      1        Multiphasic, WNL                      +----------+--------+--------+----------------+-------------------+ +---------+--------+--+--------+--+---------+  Vertebral PSV cm/s 57 EDV cm/s 12 Antegrade  +---------+--------+--+--------+--+---------+   Summary: Right Carotid: Velocities in the right ICA are consistent with a 60-79%                stenosis. Left Carotid: Velocities in the left ICA are consistent with a 1-39% stenosis. Vertebrals: Bilateral vertebral arteries demonstrate antegrade flow. *See table(s) above for measurements and observations.  Electronically signed by Servando Snare MD on 02/11/2021 at 4:38:31 PM.    Final     Labs:  CBC: Recent Labs    04/19/20 1503 02/14/21 1135  WBC 8.7 6.4  HGB 15.1 14.7  HCT 44.2 43.4  PLT 273 241    COAGS: Recent Labs    02/14/21 1135  INR 1.1    BMP: Recent Labs    04/19/20 1503  NA 134*  K 4.1  CL 104  CO2 19*  GLUCOSE 109*  BUN 14  CALCIUM 8.9  CREATININE 1.07  GFRNONAA >60    LIVER FUNCTION TESTS: Recent Labs    04/19/20 1503  BILITOT 1.1  AST 30  ALT 25  ALKPHOS 45  PROT 6.3*  ALBUMIN 4.0    TUMOR MARKERS: No results for input(s): AFPTM, CEA, CA199, CHROMGRNA in the last 8760 hours.  Assessment and Plan: 75 y.o. male with PMH sig for carotid artery occlusion, CAD, bronchiectasis, GERD, hep C,HTN,HLD, cirrhosis by imaging and MRI done on 01/29/21 at [facility] which revealed:  1.5 cm hypervascular lesion in segment 3 of the left lobe, with  probable slight increase in size since previous study. (LI-RADS  Category 4: Probably [place]). Consider biopsy versus continued imaging  follow-up in 6 months.   Two tiny sub-cm foci of arterial phase hyperenhancement in segment 2  of the left lobe. These are too small to characterize, but were not  definitely visualized on prior exam. (LI-RADS Category 3:  Intermediate  probability of malignancy). Recommend continued  attention on follow-up imaging.   Hepatic cirrhosis.     Pt presents today for image guided liver lesion biopsy for further evaluation. AFP of 22.  Risks and benefits of procedure was discussed with the patient including, but not limited to bleeding, infection, damage to adjacent structures or low yield requiring additional tests.  All of the questions were answered and there is agreement to proceed.  Consent signed and in chart.    Thank you for this interesting consult.  I greatly enjoyed meeting Tommy Santiago and look forward to participating in their care.  A copy of this report was sent to the requesting provider on this date.  Electronically Signed: D. Rowe Robert, PA-C 02/14/2021, 12:31 PM   I spent a total of 25 minutes    in face to face in clinical consultation, greater than 50% of which was counseling/coordinating care for image guided liver lesion biopsy

## 2021-02-14 NOTE — Procedures (Signed)
Interventional Radiology Procedure Note  Procedure: US guided core biopsy of liver lesion  Complications: None  Estimated Blood Loss: None  Recommendations: - Path sent - Bed rest x 2 hrs   Signed,  Criselda Peaches, MD

## 2021-02-15 LAB — SURGICAL PATHOLOGY

## 2021-03-18 ENCOUNTER — Other Ambulatory Visit (HOSPITAL_BASED_OUTPATIENT_CLINIC_OR_DEPARTMENT_OTHER): Payer: Self-pay

## 2021-03-18 MED ORDER — DOXYCYCLINE HYCLATE 100 MG PO TABS
ORAL_TABLET | ORAL | 0 refills | Status: DC
  Filled 2021-03-18: qty 20, 10d supply, fill #0

## 2021-04-09 ENCOUNTER — Other Ambulatory Visit (HOSPITAL_COMMUNITY): Payer: Self-pay

## 2021-04-09 ENCOUNTER — Other Ambulatory Visit (HOSPITAL_BASED_OUTPATIENT_CLINIC_OR_DEPARTMENT_OTHER): Payer: Self-pay

## 2021-04-09 MED ORDER — ENOXAPARIN SODIUM 40 MG/0.4ML IJ SOSY
40.0000 mg | PREFILLED_SYRINGE | Freq: Every day | INTRAMUSCULAR | 0 refills | Status: DC
  Filled 2021-04-09: qty 5.6, 14d supply, fill #0

## 2021-04-09 MED ORDER — OXYCODONE HCL 5 MG PO TABS
5.0000 mg | ORAL_TABLET | ORAL | 0 refills | Status: DC | PRN
  Filled 2021-04-09: qty 7, 2d supply, fill #0

## 2021-04-17 ENCOUNTER — Other Ambulatory Visit (HOSPITAL_BASED_OUTPATIENT_CLINIC_OR_DEPARTMENT_OTHER): Payer: Self-pay

## 2021-05-14 ENCOUNTER — Other Ambulatory Visit: Payer: Self-pay

## 2021-05-14 ENCOUNTER — Ambulatory Visit
Admission: EM | Admit: 2021-05-14 | Discharge: 2021-05-14 | Disposition: A | Discharge status: Home / Self Care | Payer: PPO | Attending: Internal Medicine | Admitting: Internal Medicine

## 2021-05-14 DIAGNOSIS — R001 Bradycardia, unspecified: Secondary | ICD-10-CM

## 2021-05-14 NOTE — ED Triage Notes (Signed)
Pt states felt dizzy last night and his b/p 170/78 and pulse 47 with a few spells of palpitations. States help his lopressor last d/t bradycardia. Denies pain or SOB at this time. No distress noted. Denies dizziness. EKG preformed in triage.  ?

## 2021-05-14 NOTE — ED Provider Notes (Signed)
?[facility] ? ? ? ?CSN: [id number] ?Arrival date & time: 05/14/21  1453 ?  ? ?HISTORY  ? ?Chief Complaint  ?Patient presents with  ? Palpitations  ? ?HPI ?Tommy Santiago is a 76 y.o. male. Patient is a 76 year old male who presents to urgent care today complaining of "heart pain".  EMR reviewed.  Patient has a past medical history significant for atherosclerotic coronary artery disease status post stent placement and hepatocellular carcinoma. ? ?Patient states that last night he felt dizzy and checked his blood pressure which was 170/78 and a heart rate of 47.  Patient states he had a few spells of palpitations as well.  Patient states he decided not to take his Lopressor due to his heart rate being so low.  Patient states at this time he is not feeling short of breath, is not having any pain and not feeling dizzy.  Patient appears to be in no acute distress at this time. ? ?EKG performed in triage today revealed no significant change from previous EKGs in his chart, sinus bradycardia again present with no T wave abnormalities or ST changes. ? ?The history is provided by the patient.  ?Past Medical History:  ?Diagnosis Date  ? Bronchiectasis ([place])   ? Bronchiectasis ([place])   ? CAP (community acquired pneumonia)   ? Carotid artery occlusion   ? Coronary atherosclerosis of unspecified type of vessel, native or graft   ? status post cardiac cath w percutaneous coronary intervention using XIENCE drug-eluting stent to mid left anterior descending 07/23/08; LHC 08/07/11: Mid LAD stent patent, moderate size diagonal branch jailed by stent with 70% ostial stenosis, unchanged from 2010 with excellent flow down the diagonal branch, mid circumflex 30%, proximal and mid RCA 20%, EF 60%.  Medical therapy continued.   ? GERD (gastroesophageal reflux disease)   ? Hepatitis C   ? Treated [place]  ? Hyperlipemia   ? Hypertension   ? Hypopotassemia   ? Lichen planus   ? Other and unspecified hyperlipidemia   ? Other  specified cardiac dysrhythmias(427.89)   ? Paroxysmal supraventricular tachycardia ([place])   ? Personal history of other infectious and parasitic disease   ? Plantar fascial fibromatosis   ? Pneumonia   ? Syncope and collapse   ? ?Patient Active Problem List  ? Diagnosis Date Noted  ? Syncope 09/23/2019  ? OSA on CPAP 09/23/2019  ? Alcohol use 09/23/2019  ? GERD (gastroesophageal reflux disease) 09/23/2019  ? Hyponatremia 09/22/2019  ? Obesity (BMI 30-39.9) 06/24/2017  ? Coronary artery disease involving native coronary artery of native heart 06/22/2017  ? Witnessed episode of apnea 06/22/2017  ? Nocturia more than twice per night 06/22/2017  ? Snoring 06/22/2017  ? Circadian rhythm sleep disorder, shift work type 06/22/2017  ? 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 Family history of colon cancer 10/28/2016  ? History of adenomatous polyp of colon 10/28/2016  ? 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 Hyperglycemia 07/11/2016  ? Sepsis ([place]) 07/11/2016  ? CAP (community acquired pneumonia) 07/10/2016  ? Dysphagia 05/08/2016  ? Bronchiectasis ([place]) 04/29/2016  ? Prediabetes 03/28/2016  ? Age-related incipient cataract of both eyes 09/28/2015  ? Hx of cataract surgery 09/28/2015  ? Umbilical hernia without obstruction and without gangrene 09/28/2015  ? Closed compression fracture of second lumbar vertebra ([place]) 01/23/2015  ? Annual physical exam 09/20/2014  ? Hepatitis C 07/20/2012  ? History of hepatitis C 07/20/2012  ? Personal history of tobacco use, presenting hazards  to health 07/20/2012  ? Depressive disorder 11/06/2011  ? ED (erectile dysfunction) of organic origin 11/06/2011  ? Osteopenia 11/06/2011  ? CAD, NATIVE VESSEL 07/24/2009  ? Hyperlipidemia 06/30/2008  ? HYPOKALEMIA 06/30/2008  ? Essential hypertension 06/30/2008  ? PAROXYSMAL SUPRAVENTRICULAR TACHYCARDIA 06/30/2008  ? BRADYCARDIA 06/30/2008  ? LICHEN PLANUS, MOUTH 06/30/2008  ? PLANTAR FASCIITIS 06/30/2008  ? VASOVAGAL  SYNCOPE 06/30/2008  ? HEPATITIS C, HX OF 06/30/2008  ? ?Past Surgical History:  ?Procedure Laterality Date  ? CATARACT EXTRACTION    ? CHOLECYSTECTOMY    ? COLONOSCOPY    ? colonscopy    ? status post  ? CORONARY ANGIOPLASTY WITH STENT PLACEMENT    ? ESOPHAGOGASTRODUODENOSCOPY    ? status post  ? INSERTION OF MESH N/A 01/30/2017  ? Procedure: INSERTION OF MESH;  Surgeon: Georganna Skeans, MD;  Location: [place];  Service: General;  Laterality: N/A;  ? KNEE ARTHROSCOPY    ? LEFT HEART CATHETERIZATION WITH CORONARY ANGIOGRAM  08/07/2011  ? Procedure: LEFT HEART CATHETERIZATION WITH CORONARY ANGIOGRAM;  Surgeon: Burnell Blanks, MD;  Location: [facility] CATH LAB;  Service: Cardiovascular;;  ? UMBILICAL HERNIA REPAIR N/A 01/30/2017  ? Procedure: REPAIR OF UMBILICAL HERNIA;  Surgeon: Georganna Skeans, MD;  Location: [place];  Service: General;  Laterality: N/A;  ? ? ?Home Medications   ? ?Prior to Admission medications   ?Medication Sig Start Date End Date Taking? Authorizing Provider  ?albuterol (PROVENTIL) (2.5 MG/3ML) 0.083% nebulizer solution Take 3 mLs (2.5 mg total) by nebulization every 6 (six) hours as needed for wheezing or shortness of breath. 06/14/19   Chesley Mires, MD  ?aspirin EC 81 MG tablet Take 1 tablet (81 mg total) by mouth daily. ?Patient taking differently: Take 81 mg by mouth at bedtime. 06/24/17   Imogene Burn, PA-C  ?atorvastatin (LIPITOR) 20 MG tablet Take one tablet (20 mg dose) by mouth daily. 10/18/20     ?B Complex-C (SUPER B COMPLEX PO) Take 1 tablet by mouth daily.    [provider]  ?Calcium Carbonate-Vitamin D (CALCIUM-D PO) Take 1-1.5 tablets by mouth See admin instructions. Take 1 tablet in the morning and 1.5 tablets at night    [provider]  ?Cholecalciferol (VITAMIN D) 50 MCG (2000 UT) tablet Take 2,000 Units by mouth daily.    [provider]  ?dextromethorphan-guaiFENesin (ROBITUSSIN-DM) 10-100 MG/5ML liquid Take 10 mLs  by mouth every 6 (six) hours as needed for cough.    [provider]  ?diphenhydrAMINE (BENADRYL) 25 MG tablet Take 25 mg by mouth daily as needed for allergies.    [provider]  ?famotidine (PEPCID) 20 MG tablet Take 1 tablet (20 mg total) by mouth 2 (two) times daily. 01/09/21     ?Fiber POWD Take 1 Scoop by mouth daily as needed (constipation).    [provider]  ?metoprolol tartrate (LOPRESSOR) 25 MG tablet TAKE 1 TABLET BY MOUTH TWICE DAILY 08/07/20   Burnell Blanks, MD  ?MISC NATURAL PRODUCTS PO Take 7 mg by mouth daily. CBD Gummies    [provider]  ?nitroGLYCERIN (NITROSTAT) 0.4 MG SL tablet Place 1 tablet (0.4 mg total) under the tongue every 5 (five) minutes as needed for chest pain. 07/20/19   Burnell Blanks, MD  ?olmesartan (BENICAR) 20 MG tablet Take one tablet (20 mg dose) by mouth daily. 01/09/21     ?Respiratory Therapy Supplies (FLUTTER) DEVI Use as directed 06/14/19   Chesley Mires, MD  ?  sodium chloride HYPERTONIC 3 % nebulizer solution Take by nebulization in the morning and at bedtime. ?Patient taking differently: Take 4 mLs by nebulization as needed (for chest congestion). 06/14/19   Chesley Mires, MD  ?tadalafil (CIALIS) 20 MG tablet Take one tablet (20 mg dose) by mouth daily as needed. 10/18/20     ?Tetrahydrozoline HCl (VISINE OP) Place 1 drop into both eyes daily as needed (dry eyes).    [provider]  ?vitamin E 180 MG (400 UNITS) capsule Take 400 Units by mouth daily.    [provider]  ?enoxaparin (LOVENOX) 40 MG/0.4ML injection Inject 0.4 mLs (40 mg total) into the skin daily for 14 days. 04/09/21 04/09/21    ? ? ?Family History ?Family History  ?Problem Relation Age of Onset  ? Heart disease Father   ? Coronary artery disease Brother   ?     diagnosed in early 31s  ? Rectal cancer Brother   ? Coronary artery disease Other   ?     hx family  ? ?Social History ?Social History  ? ?Tobacco Use  ? Smoking status: Former   ?  Packs/day: 2.00  ?  Years: 25.00  ?  Pack years: 50.00  ?  Types: Cigarettes  ?  Quit date: 1989  ?  Years since quitting: 34.2  ? Smokeless tobacco: Never  ?Vaping Use  ? Vaping Use: Never used  ?Substance U

## 2021-05-14 NOTE — Discharge Instructions (Addendum)
Please follow-up with your cardiologist for sinus bradycardia and isolated episode of dizziness and palpitations last night. ?

## 2021-06-11 ENCOUNTER — Other Ambulatory Visit (HOSPITAL_BASED_OUTPATIENT_CLINIC_OR_DEPARTMENT_OTHER): Payer: Self-pay

## 2021-06-11 ENCOUNTER — Encounter: Payer: Self-pay | Admitting: Pulmonary Disease

## 2021-06-11 ENCOUNTER — Ambulatory Visit (INDEPENDENT_AMBULATORY_CARE_PROVIDER_SITE_OTHER): Payer: PPO | Admitting: Pulmonary Disease

## 2021-06-11 VITALS — BP 120/68 | HR 62 | Temp 97.8°F | Ht 71.0 in | Wt 236.8 lb

## 2021-06-11 DIAGNOSIS — J479 Bronchiectasis, uncomplicated: Secondary | ICD-10-CM | POA: Diagnosis not present

## 2021-06-11 MED ORDER — DOXYCYCLINE HYCLATE 100 MG PO TABS
100.0000 mg | ORAL_TABLET | Freq: Two times a day (BID) | ORAL | 0 refills | Status: DC
  Filled 2021-06-11: qty 14, 7d supply, fill #0

## 2021-06-11 MED ORDER — SODIUM CHLORIDE 3 % IN NEBU
4.0000 mL | INHALATION_SOLUTION | RESPIRATORY_TRACT | 5 refills | Status: DC | PRN
Start: 2021-06-11 — End: 2022-08-12
  Filled 2021-06-11: qty 240, 60d supply, fill #0
  Filled 2021-07-03: qty 240, fill #0

## 2021-06-11 NOTE — Progress Notes (Signed)
? ?[facility] ? ?Chief Complaint  ?Patient presents with  ? Follow-up  ?  Chest congestion at times   ? ? ?Past Surgical History:  ?He  has a past surgical history that includes Cholecystectomy; Knee arthroscopy; Esophagogastroduodenoscopy; colonscopy; Coronary angioplasty with stent; left heart catheterization with coronary angiogram (08/07/2011); Umbilical hernia repair (N/A, 01/30/2017); Insertion of mesh (N/A, 01/30/2017); Cataract extraction; and Colonoscopy. ? ?Past Medical History:  ?Hep C with cirrhosis, Hepatocellular carcinoma, PNA, CAD, GERD, HLD, HTN, Lichen planus, PSVT ? ?Constitutional:  ?BP 120/68 (BP Location: Left Arm, Patient Position: Sitting, Cuff Size: Normal)   Pulse 62   Temp 97.8 ?F (36.6 ?C) (Oral)   Ht '5\' 11"'$  (1.803 m)   Wt 236 lb 12.8 oz (107.4 kg)   SpO2 95%   BMI 33.03 kg/m?  ? ?Brief Summary:  ?Tommy Santiago is a 76 y.o. male former smoker with bronchiectasis likely from reflux. ?  ? ? ? ?Subjective:  ? ?He had ablation for hepatocellular carcinoma.  Had traumatic foley remove and had hematuria post op.  Resolved. ? ?Breathing okay.  Has occasional chest congestion and cough.  Improves with albuterol, flutter valve, saline and mucinex. ? ?No fever, chest pain, or hemoptysis. ? ?Physical Exam:  ? ?Appearance - well kempt  ? ?ENMT - no sinus tenderness, no oral exudate, no LAN, Mallampati 3 airway, no stridor ? ?Respiratory - equal breath sounds bilaterally, no wheezing or rales ? ?CV - s1s2 regular rate and rhythm, no murmurs ? ?Ext - no clubbing, no edema ? ?Skin - no rashes ? ?Psych - normal mood and affect ?  ?Chest Imaging:  ?HRCT chest 05/07/16 >> atherosclerosis, GGO in LLL, minimal air trapping, b/l lower lobe cylindrical BTX, liver cirrhosis ?CT chest 03/26/21 >> 9 mm nodule RLL, basilar cylindrical BTX ? ?Sleep Tests:  ?PSG 08/07/17 >> AHI 10.5, SpO2 low 60% ? ?Cardiac Tests:  ?Echo 05/18/20 >> EF 60 to 65%, mild LVH, grade 2  DD ? ?Social History:  ?He  reports that he quit smoking about 34 years ago. His smoking use included cigarettes. He has a 50.00 pack-year smoking history. He has never used smokeless tobacco. He reports current alcohol use of about 14.0 standard drinks per week. He reports that he does not use drugs. ? ?Family History:  ?His family history includes Coronary artery disease in his brother and another family member; Heart disease in his father; Rectal cancer in his brother. ?  ? ? ?Assessment/Plan:  ? ?Bronchiectasis. ?- continue prn mucinex, flutter valve, albuterol, hypertonic saline ?- will send 10 day course of doxycycline that he can keep on hand if he has a flare up again ?  ?Obstructive sleep apnea. ?- followed by Dr. Brett Fairy with [facility] ?  ?CAD, SVT. ?- followed by Dr. Angelena Form with [facility] heart care ?  ?Hep C with cirrhosis with hepatocellular carcinoma. ?- previously followed by Dr. Earlean Shawl with [facility]; he will be getting assigned a new GI provider ? ?Time Spent Involved in Patient Care on Day of Examination:  ?26 minutes ? ?Follow up:  ? ?Patient Instructions  ?Follow up in 6 months ? ?Medication List:  ? ?Allergies as of 06/11/2021   ? ?   Reactions  ? Amlodipine Swelling, Other (See Comments)  ? Ankle swelling  ? ?  ? ?  ?Medication List  ?  ? ?  ? Accurate as of June 11, 2021 12:58 PM. If you have any questions, ask your  nurse or doctor.  ?  ?  ? ?  ? ?albuterol (2.5 MG/3ML) 0.083% nebulizer solution ?Commonly known as: PROVENTIL ?Take 3 mLs (2.5 mg total) by nebulization every 6 (six) hours as needed for wheezing or shortness of breath. ?  ?aspirin EC 81 MG tablet ?Take 1 tablet (81 mg total) by mouth daily. ?What changed: when to take this ?  ?atorvastatin 20 MG tablet ?Commonly known as: LIPITOR ?Take one tablet (20 mg dose) by mouth daily. ?  ?CALCIUM-D PO ?Take 1-1.5 tablets by mouth See admin instructions. Take 1 tablet in the morning and 1.5 tablets at night ?   ?dextromethorphan-guaiFENesin 10-100 MG/5ML liquid ?Commonly known as: ROBITUSSIN-DM ?Take 10 mLs by mouth every 6 (six) hours as needed for cough. ?  ?diphenhydrAMINE 25 MG tablet ?Commonly known as: BENADRYL ?Take 25 mg by mouth daily as needed for allergies. ?  ?doxycycline 100 MG tablet ?Commonly known as: VIBRA-TABS ?Take 1 tablet (100 mg total) by mouth 2 (two) times daily. ?Started by: Chesley Mires, MD ?  ?famotidine 20 MG tablet ?Commonly known as: PEPCID ?Take 1 tablet (20 mg total) by mouth 2 (two) times daily. ?  ?Fiber Powd ?Take 1 Scoop by mouth daily as needed (constipation). ?  ?Flutter Devi ?Use as directed ?  ?metoprolol tartrate 25 MG tablet ?Commonly known as: LOPRESSOR ?TAKE 1 TABLET BY MOUTH TWICE DAILY ?  ?MISC NATURAL PRODUCTS PO ?Take 7 mg by mouth daily. CBD Gummies ?  ?nitroGLYCERIN 0.4 MG SL tablet ?Commonly known as: NITROSTAT ?Place 1 tablet (0.4 mg total) under the tongue every 5 (five) minutes as needed for chest pain. ?  ?olmesartan 20 MG tablet ?Commonly known as: BENICAR ?Take one tablet (20 mg dose) by mouth daily. ?  ?sodium chloride HYPERTONIC 3 % nebulizer solution ?Take 4 mLs by nebulization as needed (for chest congestion). ?  ?SUPER B COMPLEX PO ?Take 1 tablet by mouth daily. ?  ?tadalafil 20 MG tablet ?Commonly known as: CIALIS ?Take one tablet (20 mg dose) by mouth daily as needed. ?  ?[place] OP ?Place 1 drop into both eyes daily as needed (dry eyes). ?  ?Vitamin D 50 MCG (2000 UT) tablet ?Take 2,000 Units by mouth daily. ?  ?vitamin E 180 MG (400 UNITS) capsule ?Take 400 Units by mouth daily. ?  ? ?  ? ? ?Signature:  ?Chesley Mires, MD ?[place] ?Pager - [phone] ?06/11/2021, 12:58 PM ?  ? ? ? ? ? ? ? ? ?

## 2021-06-11 NOTE — Patient Instructions (Signed)
Follow up in 6 months 

## 2021-07-03 ENCOUNTER — Other Ambulatory Visit (HOSPITAL_BASED_OUTPATIENT_CLINIC_OR_DEPARTMENT_OTHER): Payer: Self-pay

## 2021-07-04 ENCOUNTER — Other Ambulatory Visit (HOSPITAL_BASED_OUTPATIENT_CLINIC_OR_DEPARTMENT_OTHER): Payer: Self-pay

## 2021-07-04 MED ORDER — OLMESARTAN MEDOXOMIL 20 MG PO TABS
ORAL_TABLET | ORAL | 1 refills | Status: DC
  Filled 2021-07-04: qty 90, 90d supply, fill #0

## 2021-07-10 ENCOUNTER — Other Ambulatory Visit (HOSPITAL_BASED_OUTPATIENT_CLINIC_OR_DEPARTMENT_OTHER): Payer: Self-pay

## 2021-07-10 ENCOUNTER — Ambulatory Visit (INDEPENDENT_AMBULATORY_CARE_PROVIDER_SITE_OTHER): Payer: PPO | Admitting: Cardiovascular Disease

## 2021-07-10 ENCOUNTER — Other Ambulatory Visit: Payer: Self-pay | Admitting: *Deleted

## 2021-07-10 ENCOUNTER — Encounter: Payer: Self-pay | Admitting: Cardiovascular Disease

## 2021-07-10 VITALS — BP 152/80 | HR 49 | Ht 71.0 in | Wt 238.8 lb

## 2021-07-10 DIAGNOSIS — I10 Essential (primary) hypertension: Secondary | ICD-10-CM | POA: Diagnosis not present

## 2021-07-10 DIAGNOSIS — I471 Supraventricular tachycardia: Secondary | ICD-10-CM | POA: Diagnosis not present

## 2021-07-10 DIAGNOSIS — I2510 Atherosclerotic heart disease of native coronary artery without angina pectoris: Secondary | ICD-10-CM

## 2021-07-10 DIAGNOSIS — E782 Mixed hyperlipidemia: Secondary | ICD-10-CM

## 2021-07-10 MED ORDER — NITROGLYCERIN 0.4 MG SL SUBL
0.4000 mg | SUBLINGUAL_TABLET | SUBLINGUAL | 6 refills | Status: DC | PRN
  Filled 2021-07-10: qty 25, 8d supply, fill #0

## 2021-07-10 MED ORDER — OLMESARTAN MEDOXOMIL 40 MG PO TABS
40.0000 mg | ORAL_TABLET | Freq: Every day | ORAL | 3 refills | Status: DC
  Filled 2021-07-10: qty 90, 90d supply, fill #0
  Filled 2021-10-06: qty 90, 90d supply, fill #1
  Filled 2022-01-17: qty 90, 90d supply, fill #2
  Filled 2022-04-14 – 2022-04-15 (×2): qty 90, 90d supply, fill #3

## 2021-07-10 NOTE — Patient Instructions (Signed)
Medication Instructions:  ?Your physician has recommended you make the following change in your medication:  ?1.) increase Benicar to 40 mg - one tablet daily ? ?*If you need a refill on your cardiac medications before your next appointment, please call your pharmacy* ? ? ?Lab Work: ?About 10 days - BMET ? ?If you have labs (blood work) drawn today and your tests are completely normal, you will receive your results only by: ?MyChart Message (if you have MyChart) OR ?A paper copy in the mail ?If you have any lab test that is abnormal or we need to change your treatment, we will call you to review the results. ? ? ?Testing/Procedures: ?none ? ? ?Follow-Up: ?At [facility], you and your health needs are our priority.  As part of our continuing mission to provide you with exceptional heart care, we have created designated Provider Care Teams.  These Care Teams include your primary Cardiologist (physician) and Advanced Practice Providers (APPs -  Physician Assistants and Nurse Practitioners) who all work together to provide you with the care you need, when you need it. ? ? ?Your next appointment:   ?12 month(s) ? ?The format for your next appointment:   ?In Person ? ?Provider:   ?Lauree Chandler, MD   ? ? ?Other Instructions ? ? ?Important Information About Sugar ? ? ? ? ?  ?

## 2021-07-10 NOTE — Progress Notes (Signed)
Chief Complaint  Patient presents with   Follow-up    CAD   History of Present Illness: 76 yo male with history of CAD with placement of a drug eluting stent in the mid LAD in 2010, HTN, hyperlipidemia, former tobacco abuse, bronchiectasis, hepatocellular carcinoma and hepatitis C who is being seen today for cardiac follow up. Last cardiac cath in 2013 with stable disease, patent LAD stent. He was seen in the ED in February 2022 for lightheadedness and EMS reported SVT. He was started on metoprolol. Echo March 2022 with LVEF=60-65%. Mild LVH. Normal RV size and function. No significant valve disease. He was seen in the EP clinic by Dr. Quentin Ore and he continued the metoprolol with no plans change in medical therapy. He was found to have hepatocellular carcinoma in November 2022 and had an ablation. Seen in ED March 2023 with palpitations. EKG with sinus. No recurrence.   He is here today for follow up. The patient denies any dyspnea, palpitations, lower extremity edema, orthopnea, PND, dizziness, near syncope or syncope.  Primary Care Physician: Bernerd Limbo, MD  Past Medical History:  Diagnosis Date   Bronchiectasis ([place])    Bronchiectasis ([place])    CAP (community acquired pneumonia)    Carotid artery occlusion    Coronary atherosclerosis of unspecified type of vessel, native or graft    status post cardiac cath w percutaneous coronary intervention using XIENCE drug-eluting stent to mid left anterior descending 07/23/08; LHC 08/07/11: Mid LAD stent patent, moderate size diagonal branch jailed by stent with 70% ostial stenosis, unchanged from 2010 with excellent flow down the diagonal branch, mid circumflex 30%, proximal and mid RCA 20%, EF 60%.  Medical therapy continued.    GERD (gastroesophageal reflux disease)    Hepatitis C    Treated [place]   Hyperlipemia    Hypertension    Hypopotassemia    Lichen planus    Other and unspecified hyperlipidemia    Other specified cardiac  dysrhythmias(427.89)    Paroxysmal supraventricular tachycardia (HCC)    Personal history of other infectious and parasitic disease    Plantar fascial fibromatosis    Pneumonia    Syncope and collapse     Past Surgical History:  Procedure Laterality Date   CATARACT EXTRACTION     CHOLECYSTECTOMY     COLONOSCOPY     colonscopy     status post   CORONARY ANGIOPLASTY WITH STENT PLACEMENT     ESOPHAGOGASTRODUODENOSCOPY     status post   INSERTION OF MESH N/A 01/30/2017   Procedure: INSERTION OF MESH;  Surgeon: Georganna Skeans, MD;  Location: [place];  Service: General;  Laterality: N/A;   KNEE ARTHROSCOPY     LEFT HEART CATHETERIZATION WITH CORONARY ANGIOGRAM  08/07/2011   Procedure: LEFT HEART CATHETERIZATION WITH CORONARY ANGIOGRAM;  Surgeon: Burnell Blanks, MD;  Location[facility] LAB;  Service: Cardiovascular;;   UMBILICAL HERNIA REPAIR N/A 01/30/2017   Procedure: REPAIR OF UMBILICAL HERNIA;  Surgeon: Georganna Skeans, MD;  Location: [place];  Service: General;  Laterality: N/A;    Current Outpatient Medications  Medication Sig Dispense Refill   albuterol (PROVENTIL) (2.5 MG/3ML) 0.083% nebulizer solution Take 3 mLs (2.5 mg total) by nebulization every 6 (six) hours as needed for wheezing or shortness of breath. 300 mL 10   aspirin EC 81 MG tablet Take 1 tablet (81 mg total) by mouth daily.     atorvastatin (LIPITOR) 20 MG tablet Take one tablet (20 mg  dose) by mouth daily. 90 tablet 3   B Complex-C (SUPER B COMPLEX PO) Take 1 tablet by mouth daily.     Calcium Carbonate-Vitamin D (CALCIUM-D PO) Take 1-1.5 tablets by mouth See admin instructions. Take 1 tablet in the morning and 1.5 tablets at night     Cholecalciferol (VITAMIN D) 50 MCG (2000 UT) tablet Take 2,000 Units by mouth daily.     dextromethorphan-guaiFENesin (ROBITUSSIN-DM) 10-100 MG/5ML liquid Take 10 mLs by mouth every 6 (six) hours as needed for cough.     diphenhydrAMINE  (BENADRYL) 25 MG tablet Take 25 mg by mouth daily as needed for allergies.     Docusate Sodium (DSS) 100 MG CAPS Take 1 capsule by mouth 2 (two) times daily.     doxycycline (VIBRA-TABS) 100 MG tablet Take 1 tablet (100 mg total) by mouth 2 (two) times daily. 14 tablet 0   famotidine (PEPCID) 20 MG tablet Take 1 tablet (20 mg total) by mouth 2 (two) times daily. 180 tablet 3   Fiber POWD Take 1 Scoop by mouth daily as needed (constipation).     metoprolol tartrate (LOPRESSOR) 25 MG tablet TAKE 1 TABLET BY MOUTH TWICE DAILY 180 tablet 3   MISC NATURAL PRODUCTS PO Take 7 mg by mouth daily. CBD Gummies     olmesartan (BENICAR) 40 MG tablet Take 1 tablet (40 mg total) by mouth daily. 90 tablet 3   Respiratory Therapy Supplies (FLUTTER) DEVI Use as directed 1 each 0   Simethicone 125 MG CAPS Take 1 capsule by mouth as needed.     sodium chloride HYPERTONIC 3 % nebulizer solution Take 4 mLs by nebulization as needed (for chest congestion). 240 mL 5   tadalafil (CIALIS) 20 MG tablet Take one tablet (20 mg dose) by mouth daily as needed. 30 tablet 5   Tetrahydrozoline HCl (VISINE OP) Place 1 drop into both eyes daily as needed (dry eyes).     vitamin E 180 MG (400 UNITS) capsule Take 400 Units by mouth daily.     nitroGLYCERIN (NITROSTAT) 0.4 MG SL tablet Place 1 tablet (0.4 mg total) under the tongue every 5 (five) minutes as needed for chest pain. 25 tablet 6   No current facility-administered medications for this visit.    Allergies  Allergen Reactions   Amlodipine Swelling and Other (See Comments)    Ankle swelling    Social History   Socioeconomic History   Marital status: Married    Spouse name: Not on file   Number of children: Not on file   Years of education: Not on file   Highest education level: Not on file  Occupational History   Occupation: Critical care RN  Tobacco Use   Smoking status: Former    Packs/day: 2.00    Years: 25.00    Pack years: 50.00    Types: Cigarettes     Quit date: 1989    Years since quitting: 34.3   Smokeless tobacco: Never  Vaping Use   Vaping Use: Never used  Substance and Sexual Activity   Alcohol use: Yes    Alcohol/week: 14.0 standard drinks    Types: 14 Standard drinks or equivalent per week    Comment: social   Drug use: No    Comment: quit 25 years ago   Sexual activity: Not on file  Other Topics Concern   Not on file  Social History Narrative   Not on file   Social Determinants of Radio broadcast assistant  Strain: Not on file  Food Insecurity: Not on file  Transportation Needs: Not on file  Physical Activity: Not on file  Stress: Not on file  Social Connections: Not on file  Intimate Partner Violence: Not on file    Family History  Problem Relation Age of Onset   Heart disease Father    Coronary artery disease Brother        diagnosed in early 77s   Rectal cancer Brother    Coronary artery disease Other        hx family    Review of Systems:  As stated in the HPI and otherwise negative.   BP (!) 152/80   Pulse (!) 49   Ht '5\' 11"'$  (1.803 m)   Wt 238 lb 12.8 oz (108.3 kg)   SpO2 98%   BMI 33.31 kg/m   Physical Examination: General: Well developed, well nourished, NAD  HEENT: OP clear, mucus membranes moist  SKIN: warm, dry. No rashes. Neuro: No focal deficits  Musculoskeletal: Muscle strength 5/5 all ext  Psychiatric: Mood and affect normal  Neck: No JVD, no carotid bruits, no thyromegaly, no lymphadenopathy.  Lungs:Clear bilaterally, no wheezes, rhonci, crackles Cardiovascular: Regular brady. No murmurs, gallops or rubs. Abdomen:Soft. Bowel sounds present. Non-tender.  Extremities: No lower extremity edema. Pulses are 2 + in the bilateral DP/PT.  EKG:  EKG is ordered today. The ekg ordered today demonstrates Sinus bradycardia, rate 49 bpm  Echo 05/18/20:  1. Left ventricular ejection fraction, by estimation, is 60 to 65%. The  left ventricle has normal function. The left ventricle has  no regional  wall motion abnormalities. There is mild left ventricular hypertrophy of  the basal-septal segment. Left  ventricular diastolic parameters are consistent with Grade II diastolic  dysfunction (pseudonormalization). Elevated left atrial pressure.   2. Right ventricular systolic function is normal. The right ventricular  size is normal.   3. The mitral valve is normal in structure. No evidence of mitral valve  regurgitation. No evidence of mitral stenosis.   4. The aortic valve is tricuspid. Aortic valve regurgitation is not  visualized. Mild aortic valve sclerosis is present, with no evidence of  aortic valve stenosis.   5. The inferior vena cava is normal in size with greater than 50%  respiratory variability, suggesting right atrial pressure of 3 mmHg.   Recent Labs: 02/14/2021: Hemoglobin 14.7; Platelets 241   Lipid Panel    Component Value Date/Time   CHOL 120 04/19/2018 1018   TRIG 73 04/19/2018 1018   HDL 42 04/19/2018 1018   CHOLHDL 2.9 04/19/2018 1018   CHOLHDL 4.9 08/07/2011 0401   VLDL 16 08/07/2011 0401   LDLCALC 63 04/19/2018 1018     Wt Readings from Last 3 Encounters:  07/10/21 238 lb 12.8 oz (108.3 kg)  06/11/21 236 lb 12.8 oz (107.4 kg)  02/14/21 240 lb (108.9 kg)      Assessment and Plan:   1. CAD without angina: No chest pain. Echo in 2022 with normal LV function. Continue ASA, statin and beta blocker.    2. HTN: BP is elevated today and at home. Will increase Benicar to 40 mg daily. BMET on week.    3. Hyperlipidemia: Lipids followed in primary care. Continue statin.   4. SVT: no recent palpitations. Continue Toprol.   Current medicines are reviewed at length with the patient today.  The patient does not have concerns regarding medicines.  The following changes have been made:  no change  Labs/  tests ordered today include:   Orders Placed This Encounter  Procedures   Basic metabolic panel   EKG 76-RWPT     Disposition:   F/U  with me in 12 months.    Signed, Lauree Chandler, MD 07/11/2021 7:54 AM    [facility] [address], [place], [place]  [address] Phone: [phone]; Fax: [phone]

## 2021-07-24 LAB — BASIC METABOLIC PANEL
BUN/Creatinine Ratio: 16 (ref 10–24)
BUN: 16 mg/dL (ref 8–27)
CO2: 19 mmol/L — ABNORMAL LOW (ref 20–29)
Calcium: 9.8 mg/dL (ref 8.6–10.2)
Chloride: 97 mmol/L (ref 96–106)
Creatinine, Ser: 0.97 mg/dL (ref 0.76–1.27)
Glucose: 113 mg/dL — ABNORMAL HIGH (ref 70–99)
Potassium: 4.4 mmol/L (ref 3.5–5.2)
Sodium: 134 mmol/L (ref 134–144)
eGFR: 82 mL/min/{1.73_m2} (ref >59)

## 2021-08-12 ENCOUNTER — Ambulatory Visit: Payer: HMO

## 2021-08-12 ENCOUNTER — Encounter (HOSPITAL_COMMUNITY): Payer: HMO

## 2021-09-01 ENCOUNTER — Other Ambulatory Visit (HOSPITAL_BASED_OUTPATIENT_CLINIC_OR_DEPARTMENT_OTHER): Payer: Self-pay

## 2021-09-01 ENCOUNTER — Other Ambulatory Visit: Payer: Self-pay | Admitting: Cardiovascular Disease

## 2021-09-02 ENCOUNTER — Other Ambulatory Visit (HOSPITAL_BASED_OUTPATIENT_CLINIC_OR_DEPARTMENT_OTHER): Payer: Self-pay

## 2021-09-02 ENCOUNTER — Ambulatory Visit (HOSPITAL_COMMUNITY)
Admission: RE | Admit: 2021-09-02 | Discharge: 2021-09-02 | Disposition: A | Discharge status: Home / Self Care | Payer: PPO | Source: Ambulatory Visit | Attending: Surgery | Admitting: Surgery

## 2021-09-02 ENCOUNTER — Ambulatory Visit: Payer: PPO | Admitting: Physician Assistant

## 2021-09-02 VITALS — BP 127/73 | HR 55 | Temp 97.9°F | Resp 20 | Ht 71.0 in | Wt 225.3 lb

## 2021-09-02 DIAGNOSIS — I6529 Occlusion and stenosis of unspecified carotid artery: Secondary | ICD-10-CM | POA: Insufficient documentation

## 2021-09-02 DIAGNOSIS — I6523 Occlusion and stenosis of bilateral carotid arteries: Secondary | ICD-10-CM | POA: Diagnosis not present

## 2021-09-02 MED ORDER — METOPROLOL TARTRATE 25 MG PO TABS
ORAL_TABLET | Freq: Two times a day (BID) | ORAL | 2 refills | Status: DC
  Filled 2021-09-02: qty 180, 90d supply, fill #0
  Filled 2021-10-06 – 2021-11-26 (×2): qty 180, 90d supply, fill #1
  Filled 2022-01-17 – 2022-02-17 (×2): qty 180, 90d supply, fill #2

## 2021-09-02 MED ORDER — ATORVASTATIN CALCIUM 20 MG PO TABS
ORAL_TABLET | ORAL | 3 refills | Status: DC
Start: 2021-09-02 — End: 2022-10-23
  Filled 2021-10-06: qty 90, 90d supply, fill #0
  Filled 2022-01-17: qty 90, 90d supply, fill #1
  Filled 2022-04-14 – 2022-04-15 (×2): qty 90, 90d supply, fill #2
  Filled 2022-07-21: qty 90, 90d supply, fill #3

## 2021-09-02 NOTE — Progress Notes (Signed)
Established Carotid Patient   History of Present Illness   Tommy Santiago is a 76 y.o. (07/13/1945) male who presents for surveillance of established carotid artery stenosis. Previous carotid studies on 02/11/2021 demonstrated RICA 59-93% stenosis, LICA 5-70% stenosis. The patient does not have a history of TIA or stroke symptoms.The patient denies amaurosis fugax or monocular blindness.  The patient denies facial drooping or hemiplegia.  The patient denies receptive or expressive aphasia.  He says he is feeling well overall. A cancerous tumor was recently removed from his liver in February, and he is being serially monitored for recurrence.   Current Outpatient Medications  Medication Sig Dispense Refill   albuterol (PROVENTIL) (2.5 MG/3ML) 0.083% nebulizer solution Take 3 mLs (2.5 mg total) by nebulization every 6 (six) hours as needed for wheezing or shortness of breath. 300 mL 10   aspirin EC 81 MG tablet Take 1 tablet (81 mg total) by mouth daily.     atorvastatin (LIPITOR) 20 MG tablet Take one tablet (20 mg dose) by mouth daily. 90 tablet 3   B Complex-C (SUPER B COMPLEX PO) Take 1 tablet by mouth daily.     Calcium Carbonate-Vitamin D (CALCIUM-D PO) Take 1-1.5 tablets by mouth See admin instructions. Take 1 tablet in the morning and 1.5 tablets at night     Cholecalciferol (VITAMIN D) 50 MCG (2000 UT) tablet Take 2,000 Units by mouth daily.     dextromethorphan-guaiFENesin (ROBITUSSIN-DM) 10-100 MG/5ML liquid Take 10 mLs by mouth every 6 (six) hours as needed for cough.     diphenhydrAMINE (BENADRYL) 25 MG tablet Take 25 mg by mouth daily as needed for allergies.     Docusate Sodium (DSS) 100 MG CAPS Take 1 capsule by mouth 2 (two) times daily.     doxycycline (VIBRA-TABS) 100 MG tablet Take 1 tablet (100 mg total) by mouth 2 (two) times daily. 14 tablet 0   famotidine (PEPCID) 20 MG tablet Take 1 tablet (20 mg total) by mouth 2 (two) times daily. 180 tablet 3   Fiber POWD Take 1  Scoop by mouth daily as needed (constipation).     MISC NATURAL PRODUCTS PO Take 7 mg by mouth daily. CBD Gummies     nitroGLYCERIN (NITROSTAT) 0.4 MG SL tablet Place 1 tablet (0.4 mg total) under the tongue every 5 (five) minutes as needed for chest pain. 25 tablet 6   olmesartan (BENICAR) 40 MG tablet Take 1 tablet (40 mg total) by mouth daily. 90 tablet 3   Respiratory Therapy Supplies (FLUTTER) DEVI Use as directed 1 each 0   Simethicone 125 MG CAPS Take 1 capsule by mouth as needed.     sodium chloride HYPERTONIC 3 % nebulizer solution Take 4 mLs by nebulization as needed (for chest congestion). 240 mL 5   tadalafil (CIALIS) 20 MG tablet Take one tablet (20 mg dose) by mouth daily as needed. 30 tablet 5   Tetrahydrozoline HCl (VISINE OP) Place 1 drop into both eyes daily as needed (dry eyes).     vitamin E 180 MG (400 UNITS) capsule Take 400 Units by mouth daily.     metoprolol tartrate (LOPRESSOR) 25 MG tablet TAKE 1 TABLET BY MOUTH TWICE DAILY 180 tablet 2   No current facility-administered medications for this visit.    REVIEW OF SYSTEMS (negative unless checked):   Cardiac:  '[]'$  Chest pain or chest pressure? '[]'$  Shortness of breath upon activity? '[]'$  Shortness of breath when lying flat? '[]'$  Irregular heart rhythm?  Vascular:  '[]'$  Pain in calf, thigh, or hip brought on by walking? '[]'$  Pain in feet at night that wakes you up from your sleep? '[]'$  Blood clot in your veins? '[]'$  Leg swelling?  Pulmonary:  '[]'$  Oxygen at home? '[]'$  Productive cough? '[]'$  Wheezing?  Neurologic:  '[]'$  Sudden weakness in arms or legs? '[]'$  Sudden numbness in arms or legs? '[]'$  Sudden onset of difficult speaking or slurred speech? '[]'$  Temporary loss of vision in one eye? '[]'$  Problems with dizziness?  Gastrointestinal:  '[]'$  Blood in stool? '[]'$  Vomited blood?  Genitourinary:  '[]'$  Burning when urinating? '[]'$  Blood in urine?  Psychiatric:  '[]'$  Major depression  Hematologic:  '[]'$  Bleeding problems? '[]'$  Problems  with blood clotting?  Dermatologic:  '[]'$  Rashes or ulcers?  Constitutional:  '[]'$  Fever or chills?  Ear/Nose/Throat:  '[]'$  Change in hearing? '[]'$  Nose bleeds? '[]'$  Sore throat?  Musculoskeletal:  '[]'$  Back pain? '[]'$  Joint pain? '[]'$  Muscle pain?   Physical Examination   Vitals:   09/02/21 1238 09/02/21 1240  BP: 119/71 127/73  Pulse: (!) 55   Resp: 20   Temp: 97.9 F (36.6 C)   TempSrc: Temporal   SpO2: 97%   Weight: 225 lb 4.8 oz (102.2 kg)   Height: '5\' 11"'$  (1.803 m)    Body mass index is 31.42 kg/m.  General:  WDWN in NAD; vital signs documented above Gait: Not observed HENT: WNL, normocephalic Pulmonary: normal non-labored breathing , without Rales, rhonchi,  wheezing Cardiac: regular HR, without murmurs, without carotid bruit Abdomen: soft, NT, no masses Skin: without rashes Vascular Exam/Pulses: Radial and DP pulses 2+ bilaterally Extremities: without ischemic changes, without Gangrene , without cellulitis; without open wounds;  Musculoskeletal: no muscle wasting or atrophy  Neurologic: A&O X 3;  No focal weakness or paresthesias are detected Psychiatric:  The pt has Normal affect.  Non-Invasive Vascular Imaging   B Carotid Duplex (09/02/2021):  R ICA stenosis:  40-59% R VA:  patent and antegrade L ICA stenosis:  1-39% L VA: patent and antegrade   Medical Decision Making   Tommy Santiago is a 76 y.o. male who presents with: asymptomatic RICA stenosis 54-65% and LICA 6-81%  The patient's stenosis bilaterally is essentially unchanged. RICA stenosis is 40-59% today and was rated as 60-79% in 01/2021. His LICA stenosis is 2-75% today and was 1-39% in 01/2021. I discussed in depth with the patient the nature of atherosclerosis, and emphasized the importance of maximal medical management including strict control of blood pressure, blood glucose, and lipid levels, antiplatelet agents, obtaining regular exercise, and cessation of smoking.   The patient is aware that  without maximal medical management the underlying atherosclerotic disease process will progress, limiting the benefit of any interventions. The patient is currently on a statin: Lipitor.  The patient is currently on an anti-platelet: Plavix. Since the patient's bilateral stenosis is unchanged from 6 months ago and he is asymptomatic, he is stable for follow up in 1 year with bilateral carotid duplex. I have explained to the patient the importance of calling 911 and letting us know if he does begin to experience any stroke like symptoms.  Vicente Serene PA-C [facility] [place] [facility] Office: [facility] MD: Trula Slade

## 2021-09-09 NOTE — Progress Notes (Unsigned)
PATIENT: Tommy Santiago DOB: Nov 27, 1945  REASON FOR VISIT: follow up HISTORY FROM: patient PRIMARY NEUROLOGIST: Dr. Brett Fairy  Virtual Visit via Video Note  I connected with Tommy Santiago on 09/09/21 at  1:30 PM EDT by a video enabled telemedicine application located remotely at [facility] and verified that I am speaking with the correct person using two identifiers who was located at their own home.   I discussed the limitations of evaluation and management by telemedicine and the availability of in person appointments. The patient expressed understanding and agreed to proceed.   PATIENT: Tommy Santiago DOB: March 26, 1945  REASON FOR VISIT: follow up HISTORY FROM: patient Primary neurologist: Dr. Brett Fairy  HISTORY OF PRESENT ILLNESS: Today 09/09/21:  09/04/20: Tommy Santiago is a 76 year old male with a history of obstructive sleep apnea on CPAP.  He reports that the CPAP is working well for him.  He denies any new issues.  His download is below.    HISTORY 08/08/19    Tommy Santiago is a 76 year old male with a history of obstructive sleep apnea on CPAP.  His download indicates that he uses machine nightly for compliance of 100%.  He uses machine greater than 4 hours each night.  On average he uses his machine 8 hours and 19 minutes.  His residual AHI is 0.7 on 5 to 10 cm of water with EPR 3.  Leak in the 95th percentile is 19.6 L/min.  Reports that the CPAP is working well for him.  He returns today for an evaluation.  REVIEW OF SYSTEMS: Out of a complete 14 system review of symptoms, the patient complains only of the following symptoms, and all other reviewed systems are negative.  ALLERGIES: Allergies  Allergen Reactions   Amlodipine Swelling and Other (See Comments)    Ankle swelling    HOME MEDICATIONS: Outpatient Medications Prior to Visit  Medication Sig Dispense Refill   albuterol (PROVENTIL) (2.5 MG/3ML) 0.083% nebulizer solution Take 3 mLs  (2.5 mg total) by nebulization every 6 (six) hours as needed for wheezing or shortness of breath. 300 mL 10   aspirin EC 81 MG tablet Take 1 tablet (81 mg total) by mouth daily.     atorvastatin (LIPITOR) 20 MG tablet Take one tablet (20 mg dose) by mouth daily. 90 tablet 3   B Complex-C (SUPER B COMPLEX PO) Take 1 tablet by mouth daily.     Calcium Carbonate-Vitamin D (CALCIUM-D PO) Take 1-1.5 tablets by mouth See admin instructions. Take 1 tablet in the morning and 1.5 tablets at night     Cholecalciferol (VITAMIN D) 50 MCG (2000 UT) tablet Take 2,000 Units by mouth daily.     dextromethorphan-guaiFENesin (ROBITUSSIN-DM) 10-100 MG/5ML liquid Take 10 mLs by mouth every 6 (six) hours as needed for cough.     diphenhydrAMINE (BENADRYL) 25 MG tablet Take 25 mg by mouth daily as needed for allergies.     Docusate Sodium (DSS) 100 MG CAPS Take 1 capsule by mouth 2 (two) times daily.     doxycycline (VIBRA-TABS) 100 MG tablet Take 1 tablet (100 mg total) by mouth 2 (two) times daily. 14 tablet 0   famotidine (PEPCID) 20 MG tablet Take 1 tablet (20 mg total) by mouth 2 (two) times daily. 180 tablet 3   Fiber POWD Take 1 Scoop by mouth daily as needed (constipation).     metoprolol tartrate (LOPRESSOR) 25 MG tablet TAKE 1 TABLET BY MOUTH TWICE DAILY 180 tablet 2  MISC NATURAL PRODUCTS PO Take 7 mg by mouth daily. CBD Gummies     nitroGLYCERIN (NITROSTAT) 0.4 MG SL tablet Place 1 tablet (0.4 mg total) under the tongue every 5 (five) minutes as needed for chest pain. 25 tablet 6   olmesartan (BENICAR) 40 MG tablet Take 1 tablet (40 mg total) by mouth daily. 90 tablet 3   Respiratory Therapy Supplies (FLUTTER) DEVI Use as directed 1 each 0   Simethicone 125 MG CAPS Take 1 capsule by mouth as needed.     sodium chloride HYPERTONIC 3 % nebulizer solution Take 4 mLs by nebulization as needed (for chest congestion). 240 mL 5   tadalafil (CIALIS) 20 MG tablet Take one tablet (20 mg dose) by mouth daily as  needed. 30 tablet 5   Tetrahydrozoline HCl (VISINE OP) Place 1 drop into both eyes daily as needed (dry eyes).     vitamin E 180 MG (400 UNITS) capsule Take 400 Units by mouth daily.     No facility-administered medications prior to visit.    PAST MEDICAL HISTORY: Past Medical History:  Diagnosis Date   Bronchiectasis ([place])    Bronchiectasis ([place])    CAP (community acquired pneumonia)    Carotid artery occlusion    Coronary atherosclerosis of unspecified type of vessel, native or graft    status post cardiac cath w percutaneous coronary intervention using XIENCE drug-eluting stent to mid left anterior descending 07/23/08; LHC 08/07/11: Mid LAD stent patent, moderate size diagonal branch jailed by stent with 70% ostial stenosis, unchanged from 2010 with excellent flow down the diagonal branch, mid circumflex 30%, proximal and mid RCA 20%, EF 60%.  Medical therapy continued.    GERD (gastroesophageal reflux disease)    Hepatitis C    Treated Harmony   Hyperlipemia    Hypertension    Hypopotassemia    Lichen planus    Other and unspecified hyperlipidemia    Other specified cardiac dysrhythmias(427.89)    Paroxysmal supraventricular tachycardia (HCC)    Personal history of other infectious and parasitic disease    Plantar fascial fibromatosis    Pneumonia    Syncope and collapse     PAST SURGICAL HISTORY: Past Surgical History:  Procedure Laterality Date   CATARACT EXTRACTION     CHOLECYSTECTOMY     COLONOSCOPY     colonscopy     status post   CORONARY ANGIOPLASTY WITH STENT PLACEMENT     ESOPHAGOGASTRODUODENOSCOPY     status post   INSERTION OF MESH N/A 01/30/2017   Procedure: INSERTION OF MESH;  Surgeon: Georganna Skeans, MD;  Location: [place];  Service: General;  Laterality: N/A;   KNEE ARTHROSCOPY     LEFT HEART CATHETERIZATION WITH CORONARY ANGIOGRAM  08/07/2011   Procedure: LEFT HEART CATHETERIZATION WITH CORONARY ANGIOGRAM;  Surgeon: Burnell Blanks, MD;  Location: [facility] CATH LAB;  Service: Cardiovascular;;   UMBILICAL HERNIA REPAIR N/A 01/30/2017   Procedure: REPAIR OF UMBILICAL HERNIA;  Surgeon: Georganna Skeans, MD;  Location: [place];  Service: General;  Laterality: N/A;    FAMILY HISTORY: Family History  Problem Relation Age of Onset   Heart disease Father    Coronary artery disease Brother        diagnosed in early 61s   Rectal cancer Brother    Coronary artery disease Other        hx family    SOCIAL HISTORY: Social History   Socioeconomic History   Marital status: Married  Spouse name: Not on file   Number of children: Not on file   Years of education: Not on file   Highest education level: Not on file  Occupational History   Occupation: Critical care RN  Tobacco Use   Smoking status: Former    Packs/day: 2.00    Years: 25.00    Total pack years: 50.00    Types: Cigarettes    Quit date: 32    Years since quitting: 34.5    Passive exposure: Never   Smokeless tobacco: Never  Vaping Use   Vaping Use: Never used  Substance and Sexual Activity   Alcohol use: Yes    Alcohol/week: 14.0 standard drinks of alcohol    Types: 14 Standard drinks or equivalent per week    Comment: social   Drug use: No    Comment: quit 25 years ago   Sexual activity: Not on file  Other Topics Concern   Not on file  Social History Narrative   Not on file   Social Determinants of Health   Financial Resource Strain: Not on file  Food Insecurity: Not on file  Transportation Needs: Not on file  Physical Activity: Not on file  Stress: Not on file  Social Connections: Not on file  Intimate Partner Violence: Not on file      PHYSICAL EXAM Generalized: Well developed, in no acute distress   Neurological examination  Mentation: Alert oriented to time, place, history taking. Follows all commands speech and language fluent Cranial nerve II-XII:Extraocular movements were full. Facial symmetry noted.  uvula tongue midline. Head turning and shoulder shrug  were normal and symmetric. Motor: Good strength throughout subjectively per patient Sensory: Sensory testing is intact to soft touch on all 4 extremities subjectively per patient Coordination: Cerebellar testing reveals good finger-nose-finger  Gait and station: Patient is able to stand from a seated position. gait is normal.  Reflexes: UTA  DIAGNOSTIC DATA (LABS, IMAGING, TESTING) - I reviewed patient records, labs, notes, testing and imaging myself where available.  Lab Results  Component Value Date   WBC 6.4 02/14/2021   HGB 14.7 02/14/2021   HCT 43.4 02/14/2021   MCV 99.8 02/14/2021   PLT 241 02/14/2021      Component Value Date/Time   NA 134 07/23/2021 1022   K 4.4 07/23/2021 1022   CL 97 07/23/2021 1022   CO2 19 (L) 07/23/2021 1022   GLUCOSE 113 (H) 07/23/2021 1022   GLUCOSE 109 (H) 04/19/2020 1503   BUN 16 07/23/2021 1022   CREATININE 0.97 07/23/2021 1022   CALCIUM 9.8 07/23/2021 1022   PROT 6.3 (L) 04/19/2020 1503   PROT 6.5 04/19/2018 1018   ALBUMIN 4.0 04/19/2020 1503   ALBUMIN 4.4 04/19/2018 1018   AST 30 04/19/2020 1503   ALT 25 04/19/2020 1503   ALKPHOS 45 04/19/2020 1503   BILITOT 1.1 04/19/2020 1503   BILITOT 0.6 04/19/2018 1018   GFRNONAA >60 04/19/2020 1503   GFRAA >60 09/25/2019 1112   Lab Results  Component Value Date   CHOL 120 04/19/2018   HDL 42 04/19/2018   LDLCALC 63 04/19/2018   TRIG 73 04/19/2018   CHOLHDL 2.9 04/19/2018   No results found for: "HGBA1C" No results found for: "VITAMINB12" Lab Results  Component Value Date   TSH 1.940 04/19/2020      ASSESSMENT AND PLAN 76 y.o. year old male  has a past medical history of Bronchiectasis ([place]), Bronchiectasis ([place]), CAP (community acquired pneumonia), Carotid artery occlusion, Coronary atherosclerosis  of unspecified type of vessel, native or graft, GERD (gastroesophageal reflux disease), Hepatitis C, Hyperlipemia, Hypertension,  Hypopotassemia, Lichen planus, Other and unspecified hyperlipidemia, Other specified cardiac dysrhythmias(427.89), Paroxysmal supraventricular tachycardia ([place]), Personal history of other infectious and parasitic disease, Plantar fascial fibromatosis, Pneumonia, and Syncope and collapse. here with:  OSA on CPAP  CPAP compliance excellent Residual AHI is good Encouraged patient to continue using CPAP nightly and > 4 hours each night F/U in 1 year or sooner if needed   Ward Givens, MSN, NP-C 09/09/2021, 9:32 AM [facility] [address], [place], [place] [address] [phone]

## 2021-09-10 ENCOUNTER — Encounter: Payer: Self-pay | Admitting: Adult Health

## 2021-09-10 ENCOUNTER — Ambulatory Visit: Payer: PPO | Admitting: Adult Health

## 2021-09-10 VITALS — BP 122/69 | HR 48 | Ht 71.0 in | Wt 227.0 lb

## 2021-09-10 DIAGNOSIS — Z9989 Dependence on other enabling machines and devices: Secondary | ICD-10-CM

## 2021-09-10 DIAGNOSIS — G4733 Obstructive sleep apnea (adult) (pediatric): Secondary | ICD-10-CM

## 2021-09-10 NOTE — Progress Notes (Signed)
PATIENT: Tommy Santiago DOB: 1945-11-12  REASON FOR VISIT: follow up HISTORY FROM: patient PRIMARY NEUROLOGIST: Dr. Brett Fairy  Chief Complaint  Patient presents with   Follow-up    Pt in 18 Pt here for follow up with CPAP pt states no questions or concerns for todays visit      HISTORY OF PRESENT ILLNESS: Today 09/10/21:  Tommy Santiago is a 76 year old male with a history of obstructive sleep apnea on CPAP.  He returns today for follow-up.  He reports that the CPAP is working well for him.  Denies any new issues.  Returns today for follow-up.    REVIEW OF SYSTEMS: Out of a complete 14 system review of symptoms, the patient complains only of the following symptoms, and all other reviewed systems are negative.  FSS 14 ESS 14  ALLERGIES: Allergies  Allergen Reactions   Amlodipine Swelling and Other (See Comments)    Ankle swelling    HOME MEDICATIONS: Outpatient Medications Prior to Visit  Medication Sig Dispense Refill   albuterol (PROVENTIL) (2.5 MG/3ML) 0.083% nebulizer solution Take 3 mLs (2.5 mg total) by nebulization every 6 (six) hours as needed for wheezing or shortness of breath. 300 mL 10   aspirin EC 81 MG tablet Take 1 tablet (81 mg total) by mouth daily.     atorvastatin (LIPITOR) 20 MG tablet Take one tablet (20 mg dose) by mouth daily. 90 tablet 3   B Complex-C (SUPER B COMPLEX PO) Take 1 tablet by mouth daily.     Calcium Carbonate-Vitamin D (CALCIUM-D PO) Take 1-1.5 tablets by mouth See admin instructions. Take 1 tablet in the morning and 1.5 tablets at night     Cholecalciferol (VITAMIN D) 50 MCG (2000 UT) tablet Take 2,000 Units by mouth daily.     dextromethorphan-guaiFENesin (ROBITUSSIN-DM) 10-100 MG/5ML liquid Take 10 mLs by mouth every 6 (six) hours as needed for cough.     diphenhydrAMINE (BENADRYL) 25 MG tablet Take 25 mg by mouth daily as needed for allergies.     Docusate Sodium (DSS) 100 MG CAPS Take 1 capsule by mouth 2 (two) times daily.      doxycycline (VIBRA-TABS) 100 MG tablet Take 1 tablet (100 mg total) by mouth 2 (two) times daily. 14 tablet 0   famotidine (PEPCID) 20 MG tablet Take 1 tablet (20 mg total) by mouth 2 (two) times daily. 180 tablet 3   Fiber POWD Take 1 Scoop by mouth daily as needed (constipation).     metoprolol tartrate (LOPRESSOR) 25 MG tablet TAKE 1 TABLET BY MOUTH TWICE DAILY 180 tablet 2   MISC NATURAL PRODUCTS PO Take 7 mg by mouth daily. CBD Gummies     nitroGLYCERIN (NITROSTAT) 0.4 MG SL tablet Place 1 tablet (0.4 mg total) under the tongue every 5 (five) minutes as needed for chest pain. 25 tablet 6   olmesartan (BENICAR) 40 MG tablet Take 1 tablet (40 mg total) by mouth daily. 90 tablet 3   Respiratory Therapy Supplies (FLUTTER) DEVI Use as directed 1 each 0   Simethicone 125 MG CAPS Take 1 capsule by mouth as needed.     sodium chloride HYPERTONIC 3 % nebulizer solution Take 4 mLs by nebulization as needed (for chest congestion). 240 mL 5   tadalafil (CIALIS) 20 MG tablet Take one tablet (20 mg dose) by mouth daily as needed. 30 tablet 5   Tetrahydrozoline HCl (VISINE OP) Place 1 drop into both eyes daily as needed (dry eyes).  vitamin E 180 MG (400 UNITS) capsule Take 400 Units by mouth daily.     No facility-administered medications prior to visit.    PAST MEDICAL HISTORY: Past Medical History:  Diagnosis Date   Bronchiectasis ([place])    Bronchiectasis ([place])    CAP (community acquired pneumonia)    Carotid artery occlusion    Coronary atherosclerosis of unspecified type of vessel, native or graft    status post cardiac cath w percutaneous coronary intervention using XIENCE drug-eluting stent to mid left anterior descending 07/23/08; LHC 08/07/11: Mid LAD stent patent, moderate size diagonal branch jailed by stent with 70% ostial stenosis, unchanged from 2010 with excellent flow down the diagonal branch, mid circumflex 30%, proximal and mid RCA 20%, EF 60%.  Medical therapy continued.    GERD  (gastroesophageal reflux disease)    Hepatitis C    Treated Harmony   Hyperlipemia    Hypertension    Hypopotassemia    Lichen planus    Other and unspecified hyperlipidemia    Other specified cardiac dysrhythmias(427.89)    Paroxysmal supraventricular tachycardia (HCC)    Personal history of other infectious and parasitic disease    Plantar fascial fibromatosis    Pneumonia    Syncope and collapse     PAST SURGICAL HISTORY: Past Surgical History:  Procedure Laterality Date   CATARACT EXTRACTION     CHOLECYSTECTOMY     COLONOSCOPY     colonscopy     status post   CORONARY ANGIOPLASTY WITH STENT PLACEMENT     ESOPHAGOGASTRODUODENOSCOPY     status post   INSERTION OF MESH N/A 01/30/2017   Procedure: INSERTION OF MESH;  Surgeon: Georganna Skeans, MD;  Location: [place];  Service: General;  Laterality: N/A;   KNEE ARTHROSCOPY     LEFT HEART CATHETERIZATION WITH CORONARY ANGIOGRAM  08/07/2011   Procedure: LEFT HEART CATHETERIZATION WITH CORONARY ANGIOGRAM;  Surgeon: Burnell Blanks, MD;  Location[facility] C[facility] LAB;  Service: Cardiovascular;;   UMBILICAL HERNIA REPAIR N/A 01/30/2017   Procedure: REPAIR OF UMBILICAL HERNIA;  Surgeon: Georganna Skeans, MD;  Location: [place];  Service: General;  Laterality: N/A;    FAMILY HISTORY: Family History  Problem Relation Age of Onset   Heart disease Father    Coronary artery disease Brother        diagnosed in early 74s   Rectal cancer Brother    Coronary artery disease Other        hx family    SOCIAL HISTORY: Social History   Socioeconomic History   Marital status: Married    Spouse name: Not on file   Number of children: Not on file   Years of education: Not on file   Highest education level: Not on file  Occupational History   Occupation: Critical care RN  Tobacco Use   Smoking status: Former    Packs/day: 2.00    Years: 25.00    Total pack years: 50.00    Types: Cigarettes    Quit  date: 1989    Years since quitting: 34.5    Passive exposure: Never   Smokeless tobacco: Never  Vaping Use   Vaping Use: Never used  Substance and Sexual Activity   Alcohol use: Yes    Alcohol/week: 14.0 standard drinks of alcohol    Types: 14 Standard drinks or equivalent per week    Comment: social   Drug use: No    Comment: quit 25 years ago   Sexual  activity: Not on file  Other Topics Concern   Not on file  Social History Narrative   Not on file   Social Determinants of Health   Financial Resource Strain: Not on file  Food Insecurity: Not on file  Transportation Needs: Not on file  Physical Activity: Not on file  Stress: Not on file  Social Connections: Not on file  Intimate Partner Violence: Not on file      PHYSICAL EXAM  Vitals:   09/10/21 1338  BP: 122/69  Pulse: (!) 48  Weight: 227 lb (103 kg)  Height: '5\' 11"'$  (1.803 m)   Body mass index is 31.66 kg/m.  Generalized: Well developed, in no acute distress  Chest: Lungs clear to auscultation bilaterally  Neurological examination  Mentation: Alert oriented to time, place, history taking. Follows all commands speech and language fluent Cranial nerve II-XII: Extraocular movements were full, visual field were full on confrontational test Head turning and shoulder shrug  were normal and symmetric. Motor: The motor testing reveals 5 over 5 strength of all 4 extremities. Good symmetric motor tone is noted throughout.  Sensory: Sensory testing is intact to soft touch on all 4 extremities. No evidence of extinction is noted.  Gait and station: Gait is normal.    DIAGNOSTIC DATA (LABS, IMAGING, TESTING) - I reviewed patient records, labs, notes, testing and imaging myself where available.  Lab Results  Component Value Date   WBC 6.4 02/14/2021   HGB 14.7 02/14/2021   HCT 43.4 02/14/2021   MCV 99.8 02/14/2021   PLT 241 02/14/2021      Component Value Date/Time   NA 134 07/23/2021 1022   K 4.4 07/23/2021  1022   CL 97 07/23/2021 1022   CO2 19 (L) 07/23/2021 1022   GLUCOSE 113 (H) 07/23/2021 1022   GLUCOSE 109 (H) 04/19/2020 1503   BUN 16 07/23/2021 1022   CREATININE 0.97 07/23/2021 1022   CALCIUM 9.8 07/23/2021 1022   PROT 6.3 (L) 04/19/2020 1503   PROT 6.5 04/19/2018 1018   ALBUMIN 4.0 04/19/2020 1503   ALBUMIN 4.4 04/19/2018 1018   AST 30 04/19/2020 1503   ALT 25 04/19/2020 1503   ALKPHOS 45 04/19/2020 1503   BILITOT 1.1 04/19/2020 1503   BILITOT 0.6 04/19/2018 1018   GFRNONAA >60 04/19/2020 1503   GFRAA >60 09/25/2019 1112   Lab Results  Component Value Date   CHOL 120 04/19/2018   HDL 42 04/19/2018   LDLCALC 63 04/19/2018   TRIG 73 04/19/2018   CHOLHDL 2.9 04/19/2018   Lab Results  Component Value Date   TSH 1.940 04/19/2020      ASSESSMENT AND PLAN 76 y.o. year old male  has a past medical history of Bronchiectasis ([place]), Bronchiectasis ([place]), CAP (community acquired pneumonia), Carotid artery occlusion, Coronary atherosclerosis of unspecified type of vessel, native or graft, GERD (gastroesophageal reflux disease), Hepatitis C, Hyperlipemia, Hypertension, Hypopotassemia, Lichen planus, Other and unspecified hyperlipidemia, Other specified cardiac dysrhythmias(427.89), Paroxysmal supraventricular tachycardia ([place]), Personal history of other infectious and parasitic disease, Plantar fascial fibromatosis, Pneumonia, and Syncope and collapse. here with:  OSA on CPAP  - CPAP compliance excellent - Good treatment of AHI  - Encourage patient to use CPAP nightly and > 4 hours each night - F/U in 1 year or sooner if needed     Ward Givens, MSN, NP-C 09/10/2021, 1:35 PM [facility] [address], [place], [place] [address] [phone]

## 2021-09-10 NOTE — Patient Instructions (Signed)
Continue using CPAP nightly and greater than 4 hours each night °If your symptoms worsen or you develop new symptoms please let us know.  ° °

## 2021-10-07 ENCOUNTER — Other Ambulatory Visit (HOSPITAL_BASED_OUTPATIENT_CLINIC_OR_DEPARTMENT_OTHER): Payer: Self-pay

## 2021-10-21 ENCOUNTER — Other Ambulatory Visit (HOSPITAL_BASED_OUTPATIENT_CLINIC_OR_DEPARTMENT_OTHER): Payer: Self-pay

## 2021-10-21 MED ORDER — FLUTICASONE PROPIONATE 0.05 % EX CREA
TOPICAL_CREAM | CUTANEOUS | 0 refills | Status: DC
  Filled 2021-10-21: qty 15, 30d supply, fill #0

## 2021-10-22 ENCOUNTER — Other Ambulatory Visit (HOSPITAL_BASED_OUTPATIENT_CLINIC_OR_DEPARTMENT_OTHER): Payer: Self-pay

## 2021-11-22 ENCOUNTER — Encounter: Payer: Self-pay | Admitting: Nurse Practitioner

## 2021-11-22 ENCOUNTER — Ambulatory Visit (INDEPENDENT_AMBULATORY_CARE_PROVIDER_SITE_OTHER): Payer: PPO | Admitting: Nurse Practitioner

## 2021-11-22 VITALS — BP 128/72 | HR 48 | Ht 71.0 in | Wt 212.2 lb

## 2021-11-22 DIAGNOSIS — Z9989 Dependence on other enabling machines and devices: Secondary | ICD-10-CM

## 2021-11-22 DIAGNOSIS — R911 Solitary pulmonary nodule: Secondary | ICD-10-CM | POA: Diagnosis not present

## 2021-11-22 DIAGNOSIS — J479 Bronchiectasis, uncomplicated: Secondary | ICD-10-CM | POA: Diagnosis not present

## 2021-11-22 DIAGNOSIS — G4733 Obstructive sleep apnea (adult) (pediatric): Secondary | ICD-10-CM

## 2021-11-22 NOTE — Patient Instructions (Addendum)
Continue Albuterol inhaler 2 puffs or 3 mL neb every 6 hours as needed for shortness of breath or wheezing. Notify if symptoms persist despite rescue inhaler/neb use.  Continue sodium chloride 3% neb Twice daily as needed for chest congestion/cough Continue flutter valve 2-3 times a day   Doxycycline on hand should you develop productive cough/increased chest congestion - 1 tab Twice daily for 10 days.   Follow up in 6 months with Dr. [place] Bells or sooner if needed

## 2021-11-22 NOTE — Assessment & Plan Note (Signed)
Stable without any recent flares. Compensated on current regimen. Continue mucociliary clearance therapies as needed. Provided with new flutter valve today.  Patient Instructions  Continue Albuterol inhaler 2 puffs or 3 mL neb every 6 hours as needed for shortness of breath or wheezing. Notify if symptoms persist despite rescue inhaler/neb use.  Continue sodium chloride 3% neb Twice daily as needed for chest congestion/cough Continue flutter valve 2-3 times a day   Doxycycline on hand should you develop productive cough/increased chest congestion - 1 tab Twice daily for 10 days.   Follow up in 6 months with Dr. [place] Bells or sooner if needed

## 2021-11-22 NOTE — Progress Notes (Signed)
$'@Patient'B$  ID: Tommy Santiago, male    DOB: [birth year], 76 y.o.   MRN: [id number]  Chief Complaint  Patient presents with   Follow-up    Referring provider: Bernerd Limbo, MD  HPI: 76 year old male, former smoker followed for bronchiectasis likely from reflux. He is a patient of Dr. Juanetta Gosling and last seen in office on 06/11/2021. Past medical history significant for hep C with cirrhosis, hepatocellular carcinoma, CAD, GERD, HLD, hypertension, lichen planus, PSVT.  TEST/EVENTS:  05/07/2016 HRCT chest: Atherosclerosis, GGO in LLL, minimal air trapping, bilateral lower lobe cylindrical BTX, liver cirrhosis 08/07/2017 PSG: AHI 10.5, SPO2 low 60% 05/18/2020 echo: EF 60 to 65%, mild LVH, G2 DD. 03/26/2021 CT chest: 9 mm nodule right lower lobe, basilar cylindrical BTX  06/11/2021: OV with Dr. Halford Chessman.  He recently had ablation for hepatocellular carcinoma.  Had traumatic Foley removal and hematuria postop.  This is resolved.  Breathing okay.  Occasional chest congestion and cough.  Improves with albuterol, flutter valve, saline and Mucinex.  Advised that he continue mucociliary clearance therapy.  Sent with 10-day course of doxycycline that he can keep on hand if he has a flareup again.  Followed by Dr. Brett Fairy with [facility] neurology for OSA.  11/22/2021: Today - follow up Patient presents today for 10-monthfollow-up.  He has been doing well since we saw him last.  He has not had any flareups requiring antibiotics or prednisone.  He has an occasional cough, which is usually dry and baseline for him.  No significant chest congestion, wheezing, shortness of breath.  No issues with fevers, chills or night sweats.  He rarely has to use his albuterol.  Only uses his saline nebs when he has chest congestion.  He would like a new flutter valve to have on hand.  Still has the doxycycline prescribed last time which she takes with him when he travels just in case he were to get sick.  He has follow-up scheduled with  oncology and repeat CT scans in November .  Allergies  Allergen Reactions   Amlodipine Swelling and Other (See Comments)    Ankle swelling    Immunization History  Administered Date(s) Administered   Fluad Quad(high Dose 65+) 10/29/2017, 11/08/2020   Hepatitis A, Adult 07/12/2001, 12/20/2001   Hepatitis A, Ped/Adol-2 Dose 07/12/2001, 12/20/2001   Hepatitis B, PED/ADOLESCENT 07/12/2001, 12/20/2001   Hepatitis B, adult 07/12/2001, 12/20/2001   Influenza Split 10/26/2010, 12/18/2011, 11/25/2012, 11/09/2013, 10/26/2014, 11/16/2015   Influenza, High Dose Seasonal PF 10/21/2018   Influenza,inj,Quad PF,6+ Mos 11/09/2015   Influenza,trivalent, recombinat, inj, PF 11/09/2013, 10/26/2014   Influenza-Unspecified 10/26/2010, 12/18/2011, 11/25/2012, 11/09/2013, 10/26/2014, 11/16/2015, 11/05/2016, 10/21/2018   PFIZER(Purple Top)SARS-COV-2 Vaccination 04/22/2019, 05/18/2019   Pfizer Covid-19 Vaccine Bivalent Booster 197yr& up 11/08/2020   Pneumococcal Conjugate-13 07/20/2012, 07/16/2014   Pneumococcal Polysaccharide-23 02/24/1998, 09/20/2014, 06/29/2020   Pneumococcal-Unspecified 06/29/2020   Zoster Recombinat (Shingrix) 08/04/2020, 12/10/2020   Zoster, Live 01/24/2010    Past Medical History:  Diagnosis Date   [place] ([place]   [place]ctasis ([place]   CAP (community acquired pneumonia)    Carotid artery occlusion    Coronary atherosclerosis of unspecified type of vessel, native or graft    status post cardiac cath w percutaneous coronary intervention using XIENCE drug-eluting stent to mid left anterior descending 07/23/08; LHC 08/07/11: Mid LAD stent patent, moderate size diagonal branch jailed by stent with 70% ostial stenosis, unchanged from 2010 with excellent flow down the diagonal branch, mid circumflex 30%, proximal and mid  RCA 20%, EF 60%.  Medical therapy continued.    GERD (gastroesophageal reflux disease)    Hepatitis C    Treated [place]ony   Hyperlipemia    Hypertension     Hypopotassemia    Lichen planus    Other and unspecified hyperlipidemia    Other specified cardiac dysrhythmias(427.89)    Paroxysmal supraventricular tachycardia (HCC)    Personal history of other infectious and parasitic disease    Plantar fascial fibromatosis    Pneumonia    Syncope and collapse     Tobacco History: Social History   Tobacco Use  Smoking Status Former   Packs/day: 2.00   Years: 25.00   Total pack years: 50.00   Types: Cigarettes   Quit date: 1989   Years since quitting: 34.7   Passive exposure: Never  Smokeless Tobacco Never   Counseling given: Not Answered   Outpatient Medications Prior to Visit  Medication Sig Dispense Refill   albuterol (PROVENTIL) (2.5 MG/3ML) 0.083% nebulizer solution Take 3 mLs (2.5 mg total) by nebulization every 6 (six) hours as needed for wheezing or shortness of breath. 300 mL 10   aspirin EC 81 MG tablet Take 1 tablet (81 mg total) by mouth daily.     atorvastatin (LIPITOR) 20 MG tablet Take one tablet (20 mg dose) by mouth daily. 90 tablet 3   B Complex-C (SUPER B COMPLEX PO) Take 1 tablet by mouth daily.     Calcium Carbonate-Vitamin D (CALCIUM-D PO) Take 1-1.5 tablets by mouth See admin instructions. Take 1 tablet in the morning and 1.5 tablets at night     Cholecalciferol (VITAMIN D) 50 MCG (2000 UT) tablet Take 2,000 Units by mouth daily.     dextromethorphan-guaiFENesin (ROBITUSSIN-DM) 10-100 MG/5ML liquid Take 10 mLs by mouth every 6 (six) hours as needed for cough.     diphenhydrAMINE (BENADRYL) 25 MG tablet Take 25 mg by mouth daily as needed for allergies.     Docusate Sodium (DSS) 100 MG CAPS Take 1 capsule by mouth 2 (two) times daily.     doxycycline (VIBRA-TABS) 100 MG tablet Take 1 tablet (100 mg total) by mouth 2 (two) times daily. 14 tablet 0   famotidine (PEPCID) 20 MG tablet Take 1 tablet (20 mg total) by mouth 2 (two) times daily. 180 tablet 3   Fiber POWD Take 1 Scoop by mouth daily as needed (constipation).      fluticasone (CUTIVATE) 0.05 % cream Apply one application topically as needed. 15 g 0   magnesium oxide (MAG-OX) 400 MG tablet Take by mouth.     metoprolol tartrate (LOPRESSOR) 25 MG tablet TAKE 1 TABLET BY MOUTH TWICE DAILY 180 tablet 2   MISC NATURAL PRODUCTS PO Take 7 mg by mouth daily. CBD Gummies     nitroGLYCERIN (NITROSTAT) 0.4 MG SL tablet Place 1 tablet (0.4 mg total) under the tongue every 5 (five) minutes as needed for chest pain. 25 tablet 6   olmesartan (BENICAR) 40 MG tablet Take 1 tablet (40 mg total) by mouth daily. 90 tablet 3   PSYLLIUM PO Take by mouth.     Respiratory Therapy Supplies (FLUTTER) DEVI Use as directed 1 each 0   Simethicone 125 MG CAPS Take 1 capsule by mouth as needed.     sodium chloride HYPERTONIC 3 % nebulizer solution Take 4 mLs by nebulization as needed (for chest congestion). 240 mL 5   tadalafil (CIALIS) 20 MG tablet Take one tablet (20 mg dose) by mouth daily as  needed. 30 tablet 5   Tetrahydrozoline HCl (VISINE OP) Place 1 drop into both eyes daily as needed (dry eyes).     vitamin E 180 MG (400 UNITS) capsule Take 400 Units by mouth daily.     Vitamin E 268 MG (400 UNIT) CAPS Take by mouth.     No facility-administered medications prior to visit.     Review of Systems:   Constitutional: No weight loss or gain, night sweats, fevers, chills, fatigue, or lassitude. HEENT: No headaches, difficulty swallowing, tooth/dental problems, or sore throat. No sneezing, itching, ear ache, nasal congestion, or post nasal drip CV:  No chest pain, orthopnea, PND, swelling in lower extremities, anasarca, dizziness, palpitations, syncope Resp: +occasional dry cough. No shortness of breath with exertion or at rest. No excess mucus or change in color of mucus. No hemoptysis. No wheezing.  No chest wall deformity GI:  No heartburn, indigestion, abdominal pain, nausea, vomiting, diarrhea, change in bowel habits, loss of appetite, bloody stools.  MSK:  No joint  pain or swelling.  No decreased range of motion.  No back pain. Neuro: No dizziness or lightheadedness.  Psych: No depression or anxiety. Mood stable.     Physical Exam:  BP 128/72 (BP Location: Right Arm, Cuff Size: Normal)   Pulse (!) 48   Ht '5\' 11"'$  (1.803 m)   Wt 212 lb 3.2 oz (96.3 kg)   SpO2 99%   BMI 29.60 kg/m   GEN: Pleasant, interactive, well-appearing; in no acute distress. HEENT:  Normocephalic and atraumatic. PERRLA. Sclera white. Nasal turbinates pink, moist and patent bilaterally. No rhinorrhea present. Oropharynx pink and moist, without exudate or edema. No lesions, ulcerations, or postnasal drip.  NECK:  Supple w/ fair ROM. No JVD present. Normal carotid impulses w/o bruits. Thyroid symmetrical with no goiter or nodules palpated. No lymphadenopathy.   CV: RRR, no m/r/g, no peripheral edema. Pulses intact, +2 bilaterally. No cyanosis, pallor or clubbing. PULMONARY:  Unlabored, regular breathing. Clear bilaterally A&P w/o wheezes/rales/rhonchi. No accessory muscle use.  GI: BS present and normoactive. Soft, non-tender to palpation. No organomegaly or masses detected.  MSK: No erythema, warmth or tenderness. No deformities or joint swelling noted.  Neuro: A/Ox3. No focal deficits noted.   Skin: Warm, no lesions or rashe Psych: Normal affect and behavior. Judgement and thought content appropriate.     Lab Results:  CBC    Component Value Date/Time   WBC 6.4 02/14/2021 1135   RBC 4.35 02/14/2021 1135   HGB 14.7 02/14/2021 1135   HCT 43.4 02/14/2021 1135   PLT 241 02/14/2021 1135   MCV 99.8 02/14/2021 1135   MCH 33.8 02/14/2021 1135   MCHC 33.9 02/14/2021 1135   RDW 11.7 02/14/2021 1135   LYMPHSABS 1.6 04/19/2020 1503   MONOABS 0.7 04/19/2020 1503   EOSABS 0.2 04/19/2020 1503   BASOSABS 0.0 04/19/2020 1503    BMET    Component Value Date/Time   NA 134 07/23/2021 1022   K 4.4 07/23/2021 1022   CL 97 07/23/2021 1022   CO2 19 (L) 07/23/2021 1022    GLUCOSE 113 (H) 07/23/2021 1022   GLUCOSE 109 (H) 04/19/2020 1503   BUN 16 07/23/2021 1022   CREATININE 0.97 07/23/2021 1022   CALCIUM 9.8 07/23/2021 1022   GFRNONAA >60 04/19/2020 1503   GFRAA >60 09/25/2019 1112    BNP No results found for: "BNP"   Imaging:  No results found.        No data to display  No results found for: "NITRICOXIDE"      Assessment & Plan:   Bronchiectasis ([place]) Stable without any recent flares. Compensated on current regimen. Continue mucociliary clearance therapies as needed. Provided with new flutter valve today.  Patient Instructions  Continue Albuterol inhaler 2 puffs or 3 mL neb every 6 hours as needed for shortness of breath or wheezing. Notify if symptoms persist despite rescue inhaler/neb use.  Continue sodium chloride 3% neb Twice daily as needed for chest congestion/cough Continue flutter valve 2-3 times a day   Doxycycline on hand should you develop productive cough/increased chest congestion - 1 tab Twice daily for 10 days.   Follow up in 6 months with Dr. Lake Bells or sooner if needed     OSA on CPAP Managed by Dr. Brett Fairy.   Lung nodule 9 mm RLL nodule on CT chest from January 2023. He has follow up scheduled with oncology and repeat scans in November 2023.    I spent 28 minutes of dedicated to the care of this patient on the date of this encounter to include pre-visit review of records, face-to-face time with the patient discussing conditions above, post visit ordering of testing, clinical documentation with the electronic health record, making appropriate referrals as documented, and communicating necessary findings to members of the patients care team.  Clayton Bibles, NP 11/22/2021  Pt aware and understands NP's role.

## 2021-11-22 NOTE — Assessment & Plan Note (Signed)
Managed by Dr. Brett Fairy.

## 2021-11-22 NOTE — Assessment & Plan Note (Signed)
9 mm RLL nodule on CT chest from January 2023. He has follow up scheduled with oncology and repeat scans in November 2023.

## 2021-11-26 ENCOUNTER — Other Ambulatory Visit (HOSPITAL_BASED_OUTPATIENT_CLINIC_OR_DEPARTMENT_OTHER): Payer: Self-pay

## 2021-12-10 NOTE — Telephone Encounter (Signed)
Will forward to Dr. Halford Chessman as an FYI for pt's CT chest from [facility]. Impression is in Care Everywhere.

## 2022-01-02 ENCOUNTER — Other Ambulatory Visit (HOSPITAL_BASED_OUTPATIENT_CLINIC_OR_DEPARTMENT_OTHER): Payer: Self-pay

## 2022-01-02 MED ORDER — OFLOXACIN 0.3 % OP SOLN
1.0000 [drp] | Freq: Four times a day (QID) | OPHTHALMIC | 0 refills | Status: DC
  Filled 2022-01-02: qty 5, 25d supply, fill #0

## 2022-01-02 MED ORDER — PREDNISOLONE ACETATE 1 % OP SUSP
1.0000 [drp] | Freq: Four times a day (QID) | OPHTHALMIC | 0 refills | Status: DC
  Filled 2022-01-02: qty 5, 25d supply, fill #0

## 2022-01-09 ENCOUNTER — Other Ambulatory Visit (HOSPITAL_BASED_OUTPATIENT_CLINIC_OR_DEPARTMENT_OTHER): Payer: Self-pay

## 2022-01-09 MED ORDER — FAMOTIDINE 20 MG PO TABS
20.0000 mg | ORAL_TABLET | Freq: Every day | ORAL | 3 refills | Status: DC
  Filled 2022-01-09 – 2022-04-15 (×3): qty 90, 90d supply, fill #0
  Filled 2022-07-21: qty 90, 90d supply, fill #1
  Filled 2022-10-22: qty 90, 90d supply, fill #2

## 2022-01-18 ENCOUNTER — Other Ambulatory Visit (HOSPITAL_BASED_OUTPATIENT_CLINIC_OR_DEPARTMENT_OTHER): Payer: Self-pay

## 2022-01-20 ENCOUNTER — Other Ambulatory Visit (HOSPITAL_BASED_OUTPATIENT_CLINIC_OR_DEPARTMENT_OTHER): Payer: Self-pay

## 2022-01-21 ENCOUNTER — Other Ambulatory Visit (HOSPITAL_BASED_OUTPATIENT_CLINIC_OR_DEPARTMENT_OTHER): Payer: Self-pay

## 2022-04-14 ENCOUNTER — Other Ambulatory Visit (HOSPITAL_BASED_OUTPATIENT_CLINIC_OR_DEPARTMENT_OTHER): Payer: Self-pay

## 2022-04-15 ENCOUNTER — Other Ambulatory Visit (HOSPITAL_BASED_OUTPATIENT_CLINIC_OR_DEPARTMENT_OTHER): Payer: Self-pay

## 2022-04-15 ENCOUNTER — Other Ambulatory Visit: Payer: Self-pay

## 2022-04-15 MED ORDER — FAMOTIDINE 20 MG PO TABS
20.0000 mg | ORAL_TABLET | Freq: Two times a day (BID) | ORAL | 3 refills | Status: DC
  Filled 2022-04-15: qty 180, 90d supply, fill #0

## 2022-05-20 ENCOUNTER — Other Ambulatory Visit: Payer: Self-pay | Admitting: Cardiovascular Disease

## 2022-05-20 ENCOUNTER — Other Ambulatory Visit (HOSPITAL_BASED_OUTPATIENT_CLINIC_OR_DEPARTMENT_OTHER): Payer: Self-pay

## 2022-05-20 MED ORDER — METOPROLOL TARTRATE 25 MG PO TABS
25.0000 mg | ORAL_TABLET | Freq: Two times a day (BID) | ORAL | 2 refills | Status: DC
  Filled 2022-05-20: qty 180, 90d supply, fill #0
  Filled 2022-07-21 – 2022-08-10 (×2): qty 180, 90d supply, fill #1
  Filled 2022-11-12: qty 180, 90d supply, fill #2

## 2022-07-16 NOTE — Progress Notes (Signed)
Cardiology Office Note:    Date:  07/23/2022   ID:  Tommy Santiago, DOB Jun 17, 1945, MRN [id number]  PCP:  [name], MD  [facility] Providers Cardiologist:  None     Referring MD: [name], MD   Chief Complaint:  Follow-up     History of Present Illness:   Tommy Santiago is a 77 y.o. male with history of CAD with placement of a drug eluting stent in the mid LAD in 2010, HTN, hyperlipidemia, former tobacco abuse, bronchiectasis, hepatocellular carcinoma and hepatitis C. Last cardiac cath in 2013 with stable disease, patent LAD stent. He was seen in the ED in February 2022 for lightheadedness and EMS reported SVT. He was started on metoprolol. Echo March 2022 with LVEF=60-65%. Mild LVH. Normal RV size and function. No significant valve disease. He was seen in the EP clinic by Dr. Lalla Brothers and he continued the metoprolol with no plans change in medical therapy. He was found to have hepatocellular carcinoma in November 2022 and had an ablation. Seen in ED March 2023 with palpitations. EKG with sinus. No recurrence.       Patient last saw Dr. [name] 06/2021 and doing well.   Patient comes in for yearly f/u. Patient denies chest pain, dyspnea, palpitations. [place] he had some chest tightness and sob that eased with rest.  Has occurred a total of 3 times since his stent.  Nothing recently. Hasn't had any palpitations or SVT. Has a Kardia machine to check if needed. Has intentionally lost 40 lbs in the past year. Walks 7500 steps daily doing regular routine. Doing yoga 5 days/week, fit father diet with protein shake in am.      Past Medical History:  Diagnosis Date   Bronchiectasis (HCC)    Bronchiectasis (HCC)    CAP (community acquired pneumonia)    Carotid artery occlusion    Coronary atherosclerosis of unspecified type of vessel, native or graft    status post cardiac cath w percutaneous coronary intervention using XIENCE  drug-eluting stent to mid left anterior descending 07/23/08; LHC 08/07/11: Mid LAD stent patent, moderate size diagonal branch jailed by stent with 70% ostial stenosis, unchanged from 2010 with excellent flow down the diagonal branch, mid circumflex 30%, proximal and mid RCA 20%, EF 60%.  Medical therapy continued.    GERD (gastroesophageal reflux disease)    Hepatitis C    Treated [place]   Hyperlipemia    Hypertension    Hypopotassemia    Lichen planus    Other and unspecified hyperlipidemia    Other specified cardiac dysrhythmias(427.89)    Paroxysmal supraventricular tachycardia    Personal history of other infectious and parasitic disease    Plantar fascial fibromatosis    Pneumonia    Syncope and collapse    Current Medications: Current Meds  Medication Sig   albuterol (PROVENTIL) (2.5 MG/3ML) 0.083% nebulizer solution Take 3 mLs (2.5 mg total) by nebulization every 6 (six) hours as needed for wheezing or shortness of breath.   aspirin EC 81 MG tablet Take 1 tablet (81 mg total) by mouth daily.   atorvastatin (LIPITOR) 20 MG tablet Take one tablet (20 mg dose) by mouth daily.   B Complex-C (SUPER B COMPLEX PO) Take 1 tablet by mouth daily.   Calcium Carbonate-Vitamin D (CALCIUM-D PO) Take 1-1.5 tablets by mouth See admin instructions. Take 1 tablet in the morning and 1.5 tablets at night   Cholecalciferol (VITAMIN  D) 50 MCG (2000 UT) tablet Take 2,000 Units by mouth daily.   dextromethorphan-guaiFENesin (ROBITUSSIN-DM) 10-100 MG/5ML liquid Take 10 mLs by mouth every 6 (six) hours as needed for cough.   diphenhydrAMINE (BENADRYL) 25 MG tablet Take 25 mg by mouth daily as needed for allergies.   Docusate Sodium (DSS) 100 MG CAPS Take 1 capsule by mouth 2 (two) times daily.   doxycycline (VIBRA-TABS) 100 MG tablet Take 1 tablet (100 mg total) by mouth 2 (two) times daily. (Patient taking differently: Take 100 mg by mouth as needed (pneumonia systems).)   famotidine (PEPCID) 20 MG tablet  Take 1 tablet (20 mg total) by mouth daily.   Fiber POWD Take 1 Scoop by mouth daily as needed (constipation).   fluticasone (CUTIVATE) 0.05 % cream Apply one application topically as needed.   ibuprofen (ADVIL) 200 MG tablet Take by mouth as needed for headache or moderate pain.   magnesium oxide (MAG-OX) 400 MG tablet Take by mouth.   metoprolol tartrate (LOPRESSOR) 25 MG tablet Take 1 tablet (25 mg total) by mouth 2 (two) times daily.   MISC NATURAL PRODUCTS PO Take 7 mg by mouth daily. CBD Gummies   olmesartan (BENICAR) 40 MG tablet Take 1 tablet (40 mg total) by mouth daily.   polyethylene glycol (MIRALAX / GLYCOLAX) 17 g packet Take by mouth as needed for moderate constipation.   PSYLLIUM PO Take by mouth.   Respiratory Therapy Supplies (FLUTTER) DEVI Use as directed   Simethicone 125 MG CAPS Take 1 capsule by mouth as needed.   sodium chloride HYPERTONIC 3 % nebulizer solution Take 4 mLs by nebulization as needed (for chest congestion).   tadalafil (CIALIS) 20 MG tablet Take one tablet (20 mg dose) by mouth daily as needed.   tetrahydrozoline-zinc (VISINE-AC) 0.05-0.25 % ophthalmic solution Apply to eye as needed (dry eyes).   vitamin E 180 MG (400 UNITS) capsule Take 400 Units by mouth daily.   [DISCONTINUED] nitroGLYCERIN (NITROSTAT) 0.4 MG SL tablet Place 1 tablet (0.4 mg total) under the tongue every 5 (five) minutes as needed for chest pain.   [DISCONTINUED] Tetrahydrozoline HCl (VISINE OP) Place 1 drop into both eyes daily as needed (dry eyes).    Allergies:   Amlodipine   Social History   Tobacco Use   Smoking status: Former    Packs/day: 2.00    Years: 25.00    Additional pack years: 0.00    Total pack years: 50.00    Types: Cigarettes    Quit date: 56    Years since quitting: 35.4    Passive exposure: Never   Smokeless tobacco: Never  Vaping Use   Vaping Use: Never used  Substance Use Topics   Alcohol use: Not Currently    Alcohol/week: 3.0 standard drinks of  alcohol    Types: 3 Shots of liquor per week    Comment: social   Drug use: No    Comment: quit 25 years ago    Family Hx: The patient's family history includes Coronary artery disease in his brother and another family member; Heart disease in his father; Rectal cancer in his brother. There is no history of Sleep apnea.  ROS     Physical Exam:    VS:  BP 132/60   Pulse (!) 48   Ht 5\' 11"  (1.803 m)   Wt 212 lb (96.2 kg)   SpO2 96%   BMI 29.57 kg/m     Wt Readings from Last 3 Encounters:  07/23/22  212 lb (96.2 kg)  11/22/21 212 lb 3.2 oz (96.3 kg)  09/10/21 227 lb (103 kg)    Physical Exam  GEN: Obese, in no acute distress  Neck: no JVD, carotid bruits, or masses Cardiac:RRR; no murmurs, rubs, or gallops  Respiratory:  clear to auscultation bilaterally, normal work of breathing GI: soft, nontender, nondistended, + BS Ext: without cyanosis, clubbing, or edema, Good distal pulses bilaterally Neuro:  Alert and Oriented x 3,  Psych: euthymic mood, full affect        EKGs/Labs/Other Test Reviewed:    EKG:  EKG is   ordered today.  The ekg ordered today demonstrates sinus bradycardia 48/m unchanged  Recent Labs: No results found for requested labs within last 365 days.   Recent Lipid Panel No results for input(s): "CHOL", "TRIG", "HDL", "VLDL", "LDLCALC", "LDLDIRECT" in the last 8760 hours.   Prior CV Studies:     Echo 05/18/20:  1. Left ventricular ejection fraction, by estimation, is 60 to 65%. The  left ventricle has normal function. The left ventricle has no regional  wall motion abnormalities. There is mild left ventricular hypertrophy of  the basal-septal segment. Left  ventricular diastolic parameters are consistent with Grade II diastolic  dysfunction (pseudonormalization). Elevated left atrial pressure.   2. Right ventricular systolic function is normal. The right ventricular  size is normal.   3. The mitral valve is normal in structure. No evidence of  mitral valve  regurgitation. No evidence of mitral stenosis.   4. The aortic valve is tricuspid. Aortic valve regurgitation is not  visualized. Mild aortic valve sclerosis is present, with no evidence of  aortic valve stenosis.   5. The inferior vena cava is normal in size with greater than 50%  respiratory variability, suggesting right atrial pressure of 3 mmHg.     Risk Assessment/Calculations/Metrics:              ASSESSMENT & PLAN:   No problem-specific Assessment & Plan notes found for this encounter.   CAD without angina: No recent chest pain. Epidsode of chest tightness if the fall walking up a steep incline. No recurrence. He'll call if it returns. Refill NTG. Echo in 2022 with normal LV function. Continue ASA, statin and beta blocker.    HTN: BP on metoprolol and  Benicar to 40 mg daily.    Hyperlipidemia: Lipids followed in primary care. Continue statin. LDL 59 09/2021   SVT: no recent palpitations. Continue Toprol.               Dispo:  No follow-ups on file.   Medication Adjustments/Labs and Tests Ordered: Current medicines are reviewed at length with the patient today.  Concerns regarding medicines are outlined above.  Tests Ordered: No orders of the defined types were placed in this encounter.  Medication Changes: Meds ordered this encounter  Medications   nitroGLYCERIN (NITROSTAT) 0.4 MG SL tablet    Sig: Place 1 tablet (0.4 mg total) under the tongue every 5 (five) minutes as needed for chest pain.    Dispense:  25 tablet    Refill:  6   Signed, Jacolyn Reedy, PA-C  07/23/2022 11:30 AM    [facility] [address] [place], [place], [place]  [address] Phone: [phone]; Fax: [phone]

## 2022-07-21 ENCOUNTER — Other Ambulatory Visit: Payer: Self-pay | Admitting: Cardiovascular Disease

## 2022-07-22 ENCOUNTER — Other Ambulatory Visit (HOSPITAL_BASED_OUTPATIENT_CLINIC_OR_DEPARTMENT_OTHER): Payer: Self-pay

## 2022-07-22 ENCOUNTER — Other Ambulatory Visit: Payer: Self-pay

## 2022-07-23 ENCOUNTER — Encounter: Payer: Self-pay | Admitting: Physician Assistant

## 2022-07-23 ENCOUNTER — Other Ambulatory Visit (HOSPITAL_BASED_OUTPATIENT_CLINIC_OR_DEPARTMENT_OTHER): Payer: Self-pay

## 2022-07-23 ENCOUNTER — Ambulatory Visit: Payer: PPO | Attending: Physician Assistant | Admitting: Physician Assistant

## 2022-07-23 VITALS — BP 132/60 | HR 48 | Ht 71.0 in | Wt 212.0 lb

## 2022-07-23 DIAGNOSIS — I2510 Atherosclerotic heart disease of native coronary artery without angina pectoris: Secondary | ICD-10-CM | POA: Diagnosis not present

## 2022-07-23 DIAGNOSIS — I471 Supraventricular tachycardia, unspecified: Secondary | ICD-10-CM

## 2022-07-23 DIAGNOSIS — I10 Essential (primary) hypertension: Secondary | ICD-10-CM | POA: Diagnosis not present

## 2022-07-23 DIAGNOSIS — E782 Mixed hyperlipidemia: Secondary | ICD-10-CM | POA: Diagnosis not present

## 2022-07-23 MED ORDER — NITROGLYCERIN 0.4 MG SL SUBL
0.4000 mg | SUBLINGUAL_TABLET | SUBLINGUAL | 6 refills | Status: DC | PRN
  Filled 2022-07-23: qty 25, 7d supply, fill #0
  Filled 2023-03-18: qty 25, 7d supply, fill #1

## 2022-07-23 MED ORDER — OLMESARTAN MEDOXOMIL 40 MG PO TABS
40.0000 mg | ORAL_TABLET | Freq: Every day | ORAL | 3 refills | Status: DC
  Filled 2022-07-23: qty 90, 90d supply, fill #0
  Filled 2022-10-22: qty 90, 90d supply, fill #1
  Filled 2023-01-19: qty 90, 90d supply, fill #2
  Filled 2023-05-12: qty 90, 90d supply, fill #3

## 2022-07-23 NOTE — Patient Instructions (Signed)
Medication Instructions:  Your physician recommends that you continue on your current medications as directed. Please refer to the Current Medication list given to you today.  *If you need a refill on your cardiac medications before your next appointment, please call your pharmacy*   Follow-Up: At [facility], you and your health needs are our priority.  As part of our continuing mission to provide you with exceptional heart care, we have created designated Provider Care Teams.  These Care Teams include your primary Cardiologist (physician) and Advanced Practice Providers (APPs -  Physician Assistants and Nurse Practitioners) who all work together to provide you with the care you need, when you need it.   Your next appointment:   1 year(s)  Provider:   Dr [name]

## 2022-07-24 ENCOUNTER — Other Ambulatory Visit: Payer: Self-pay

## 2022-07-30 ENCOUNTER — Ambulatory Visit (INDEPENDENT_AMBULATORY_CARE_PROVIDER_SITE_OTHER): Payer: PPO

## 2022-07-30 ENCOUNTER — Ambulatory Visit (INDEPENDENT_AMBULATORY_CARE_PROVIDER_SITE_OTHER): Payer: PPO | Admitting: Podiatry

## 2022-07-30 ENCOUNTER — Encounter: Payer: Self-pay | Admitting: Podiatry

## 2022-07-30 DIAGNOSIS — M2042 Other hammer toe(s) (acquired), left foot: Secondary | ICD-10-CM | POA: Diagnosis not present

## 2022-07-30 DIAGNOSIS — M79672 Pain in left foot: Secondary | ICD-10-CM

## 2022-07-30 DIAGNOSIS — L84 Corns and callosities: Secondary | ICD-10-CM | POA: Diagnosis not present

## 2022-07-31 ENCOUNTER — Other Ambulatory Visit: Payer: Self-pay | Admitting: Podiatry

## 2022-07-31 DIAGNOSIS — L84 Corns and callosities: Secondary | ICD-10-CM

## 2022-07-31 DIAGNOSIS — M79672 Pain in left foot: Secondary | ICD-10-CM

## 2022-07-31 DIAGNOSIS — M2042 Other hammer toe(s) (acquired), left foot: Secondary | ICD-10-CM

## 2022-07-31 NOTE — Progress Notes (Signed)
Subjective:   Patient ID: Tommy Santiago, male   DOB: 77 y.o.   MRN: [id number]   HPI Patient presents stating he is having a lot of pain with his left foot and his toes are not in good position and also has nail discoloration big toenails that has not gone away.  Patient does not smoke   ROS      Objective:  Physical Exam  Neurovascular status intact muscle strength adequate with patient found to have severe digital deformities with a plantarflexed fourth toe left distal keratotic lesion that is painful when pressed making shoe gear difficult.  Patient has nail discoloration also big toenails both feet and has overall digital instability     Assessment:  Hammertoe deformity with distal painful keratotic lesion digit 4 left and nail disease chronic more trauma in orientation     Plan:  H&P reviewed all conditions did do courtesy debridement of lesion and cushion the area discussed arthroplasty or digital fusion which may be necessary and also did not recommend treatment for nails reviewing with him again the trauma element to his condition  X-rays left indicate severe plantarflexed deformity of the fourth digit left foot creating stress and pressure

## 2022-08-12 ENCOUNTER — Encounter (HOSPITAL_BASED_OUTPATIENT_CLINIC_OR_DEPARTMENT_OTHER): Payer: Self-pay | Admitting: Pulmonary Disease

## 2022-08-12 ENCOUNTER — Other Ambulatory Visit (HOSPITAL_BASED_OUTPATIENT_CLINIC_OR_DEPARTMENT_OTHER): Payer: Self-pay

## 2022-08-12 ENCOUNTER — Ambulatory Visit (INDEPENDENT_AMBULATORY_CARE_PROVIDER_SITE_OTHER): Payer: PPO | Admitting: Pulmonary Disease

## 2022-08-12 VITALS — BP 124/66 | HR 55 | Temp 98.9°F | Ht 71.0 in | Wt 214.6 lb

## 2022-08-12 DIAGNOSIS — J479 Bronchiectasis, uncomplicated: Secondary | ICD-10-CM | POA: Diagnosis not present

## 2022-08-12 DIAGNOSIS — R911 Solitary pulmonary nodule: Secondary | ICD-10-CM

## 2022-08-12 MED ORDER — DOXYCYCLINE HYCLATE 100 MG PO TABS
100.0000 mg | ORAL_TABLET | Freq: Two times a day (BID) | ORAL | 0 refills | Status: DC
  Filled 2022-08-12: qty 14, 7d supply, fill #0

## 2022-08-12 MED ORDER — SODIUM CHLORIDE 3 % IN NEBU
4.0000 mL | INHALATION_SOLUTION | Freq: Every day | RESPIRATORY_TRACT | 5 refills | Status: DC | PRN
  Filled 2022-08-12: qty 240, 16d supply, fill #0

## 2022-08-12 MED ORDER — ALBUTEROL SULFATE (2.5 MG/3ML) 0.083% IN NEBU
2.5000 mg | INHALATION_SOLUTION | Freq: Four times a day (QID) | RESPIRATORY_TRACT | 10 refills | Status: DC | PRN
  Filled 2022-08-12: qty 300, 25d supply, fill #0

## 2022-08-12 NOTE — Progress Notes (Signed)
[facility]  Chief Complaint  Patient presents with   Follow-up    Follow up. Patient has no complaints.      Past Surgical History:  He  has a past surgical history that includes Cholecystectomy; Knee arthroscopy; Esophagogastroduodenoscopy; colonscopy; Coronary angioplasty with stent; left heart catheterization with coronary angiogram (08/07/2011); Umbilical hernia repair (N/A, 01/30/2017); Insertion of mesh (N/A, 01/30/2017); Cataract extraction; and Colonoscopy.  Past Medical History:  Hep C with cirrhosis, Hepatocellular carcinoma, PNA, CAD, GERD, HLD, HTN, Lichen planus, PSVT  Constitutional:  BP 124/66 (BP Location: Right Arm, Patient Position: Sitting, Cuff Size: Normal)   Pulse (!) 55   Temp 98.9 F (37.2 C) (Oral)   Ht 5\' 11"  (1.803 m)   Wt 214 lb 9.6 oz (97.3 kg)   SpO2 100%   BMI 29.93 kg/m   Brief Summary:  Tommy Santiago is a 77 y.o. male former smoker with bronchiectasis likely from reflux.      Subjective:   He went to a beard competition around his birthday.  Got a respiratory infection.  Cleared with doxycycline.  Not having cough, wheeze, sputum, hemoptysis, fever now.  CT chest from April 2024 was stable.  Uses CPAP nightly w/o difficulty.  Physical Exam:   Appearance - well kempt   ENMT - no sinus tenderness, no oral exudate, no LAN, Mallampati 3 airway, no stridor  Respiratory - equal breath sounds bilaterally, no wheezing or rales  CV - s1s2 regular rate and rhythm, no murmurs  Ext - no clubbing, no edema  Skin - no rashes  Psych - normal mood and affect   Chest Imaging:  HRCT chest 05/07/16 >> atherosclerosis, GGO in LLL, minimal air trapping, b/l lower lobe cylindrical BTX, liver cirrhosis CT chest 03/26/21 >> 9 mm nodule RLL, basilar cylindrical BTX CT chest 06/11/22 >> no change in RLL nodule, subpleural reticulations and traction BTX  Sleep Tests:  PSG 08/07/17 >> AHI 10.5, SpO2 low  60%  Cardiac Tests:  Echo 05/18/20 >> EF 60 to 65%, mild LVH, grade 2 DD  Social History:  He  reports that he quit smoking about 35 years ago. His smoking use included cigarettes. He has a 50.00 pack-year smoking history. He has never been exposed to tobacco smoke. He has never used smokeless tobacco. He reports that he does not currently use alcohol after a past usage of about 3.0 standard drinks of alcohol per week. He reports that he does not use drugs.  Family History:  His family history includes Coronary artery disease in his brother and another family member; Heart disease in his father; Rectal cancer in his brother.     Assessment/Plan:   Bronchiectasis. - prn mucinex, flutter valve, albuterol, hypertonic saline - he keeps a script for doxycycline for flare ups  Lung nodules. - stable on most recent CT chest - he will get follow up imaging at [facility]   Obstructive sleep apnea. - followed by Dr. Vickey Huger with [facility]   CAD, SVT. - followed by Dr. 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 - followed by [facility] gastrenterology  Time Spent Involved in Patient Care on Day of Examination:  25 minutes  Follow up:   Patient Instructions  Follow up in 1 year  Medication List:   Allergies as of 08/12/2022       Reactions   Amlodipine Swelling, Other (See Comments)   Ankle swelling  Medication List        Accurate as of August 12, 2022  1:26 PM. If you have any questions, ask your nurse or doctor.          albuterol (2.5 MG/3ML) 0.083% nebulizer solution Commonly known as: PROVENTIL Take 3 mLs (2.5 mg total) by nebulization every 6 (six) hours as needed for wheezing or shortness of breath.   aspirin EC 81 MG tablet Take 1 tablet (81 mg total) by mouth daily.   atorvastatin 20 MG tablet Commonly known as: LIPITOR Take one tablet (20 mg dose) by mouth daily.   CALCIUM-D PO Take 1-1.5  tablets by mouth See admin instructions. Take 1 tablet in the morning and 1.5 tablets at night   dextromethorphan-guaiFENesin 10-100 MG/5ML liquid Commonly known as: ROBITUSSIN-DM Take 10 mLs by mouth every 6 (six) hours as needed for cough.   diphenhydrAMINE 25 MG tablet Commonly known as: BENADRYL Take 25 mg by mouth daily as needed for allergies.   doxycycline 100 MG tablet Commonly known as: VIBRA-TABS Take 1 tablet (100 mg total) by mouth 2 (two) times daily. What changed:  when to take this reasons to take this   DSS 100 MG Caps Take 1 capsule by mouth 2 (two) times daily.   famotidine 20 MG tablet Commonly known as: PEPCID Take 1 tablet (20 mg total) by mouth daily.   Fiber Powd Take 1 Scoop by mouth daily as needed (constipation).   fluticasone 0.05 % cream Commonly known as: CUTIVATE Apply one application topically as needed.   Flutter Devi Use as directed   ibuprofen 200 MG tablet Commonly known as: ADVIL Take by mouth as needed for headache or moderate pain.   magnesium oxide 400 MG tablet Commonly known as: MAG-OX Take by mouth.   metoprolol tartrate 25 MG tablet Commonly known as: LOPRESSOR Take 1 tablet (25 mg total) by mouth 2 (two) times daily.   MISC NATURAL PRODUCTS PO Take 7 mg by mouth daily. CBD Gummies   nitroGLYCERIN 0.4 MG SL tablet Commonly known as: NITROSTAT Place 1 tablet (0.4 mg total) under the tongue every 5 (five) minutes as needed for chest pain.   olmesartan 40 MG tablet Commonly known as: Benicar Take 1 tablet (40 mg total) by mouth daily.   polyethylene glycol 17 g packet Commonly known as: MIRALAX / GLYCOLAX Take by mouth as needed for moderate constipation.   PSYLLIUM PO Take by mouth.   Simethicone 125 MG Caps Take 1 capsule by mouth as needed.   sodium chloride HYPERTONIC 3 % nebulizer solution Take 4 mLs by nebulization as needed (for chest congestion).   SUPER B COMPLEX PO Take 1 tablet by mouth daily.    tadalafil 20 MG tablet Commonly known as: CIALIS Take one tablet (20 mg dose) by mouth daily as needed.   tetrahydrozoline-zinc 0.05-0.25 % ophthalmic solution Commonly known as: VISINE-AC Apply to eye as needed (dry eyes).   Vitamin D 50 MCG (2000 UT) tablet Take 2,000 Units by mouth daily.   vitamin E 180 MG (400 UNITS) capsule Take 400 Units by mouth daily.        Signature:  Coralyn Helling, MD [facility] Pager - [phone] 08/12/2022, 1:26 PM

## 2022-08-12 NOTE — Patient Instructions (Signed)
Follow up in 1 year.

## 2022-09-10 NOTE — Progress Notes (Signed)
PATIENT: Tommy Santiago DOB: 08/14/1945  REASON FOR VISIT: follow up HISTORY FROM: patient PRIMARY NEUROLOGIST: Dr. Vickey Huger  Chief Complaint  Patient presents with   Follow-up    Pt in 19 Pt here for CPAP f/u Pt states no questions or concerns for today's visit      HISTORY OF PRESENT ILLNESS: Today 09/11/22:  Tommy Santiago is a 77 y.o. male with a history of OSA on CPAP . Returns today for follow-up.  Overall he feels that he is doing well.  His CPAP continues to work well for him.  He continues to notice the benefit.  His download is below       09/11/22: [name] is a 77 year old male with a history of obstructive sleep apnea on CPAP.  He returns today for follow-up.  He reports that the CPAP is working well for him.  Denies any new issues.  Returns today for follow-up.    REVIEW OF SYSTEMS: Out of a complete 14 system review of symptoms, the patient complains only of the following symptoms, and all other reviewed systems are negative.  FSS 13 ESS 3  ALLERGIES: Allergies  Allergen Reactions   Amlodipine Swelling and Other (See Comments)    Ankle swelling    HOME MEDICATIONS: Outpatient Medications Prior to Visit  Medication Sig Dispense Refill   albuterol (PROVENTIL) (2.5 MG/3ML) 0.083% nebulizer solution Take 3 mLs (2.5 mg total) by nebulization every 6 (six) hours as needed for wheezing or shortness of breath. 360 mL 10   aspirin EC 81 MG tablet Take 1 tablet (81 mg total) by mouth daily.     atorvastatin (LIPITOR) 20 MG tablet Take one tablet (20 mg dose) by mouth daily. 90 tablet 3   B Complex-C (SUPER B COMPLEX PO) Take 1 tablet by mouth daily.     Calcium Carbonate-Vitamin D (CALCIUM-D PO) Take 1-1.5 tablets by mouth See admin instructions. Take 1 tablet in the morning and 1.5 tablets at night     Cholecalciferol (VITAMIN D) 50 MCG (2000 UT) tablet Take 2,000 Units by mouth daily.     dextromethorphan-guaiFENesin (ROBITUSSIN-DM) 10-100 MG/5ML  liquid Take 10 mLs by mouth every 6 (six) hours as needed for cough.     diphenhydrAMINE (BENADRYL) 25 MG tablet Take 25 mg by mouth daily as needed for allergies.     Docusate Sodium (DSS) 100 MG CAPS Take 1 capsule by mouth 2 (two) times daily.     doxycycline (VIBRA-TABS) 100 MG tablet Take 1 tablet (100 mg total) by mouth 2 (two) times daily. 14 tablet 0   famotidine (PEPCID) 20 MG tablet Take 1 tablet (20 mg total) by mouth daily. 90 tablet 3   Fiber POWD Take 1 Scoop by mouth daily as needed (constipation).     fluticasone (CUTIVATE) 0.05 % cream Apply one application topically as needed. 15 g 0   ibuprofen (ADVIL) 200 MG tablet Take by mouth as needed for headache or moderate pain.     magnesium oxide (MAG-OX) 400 MG tablet Take by mouth.     metoprolol tartrate (LOPRESSOR) 25 MG tablet Take 1 tablet (25 mg total) by mouth 2 (two) times daily. 180 tablet 2   MISC NATURAL PRODUCTS PO Take 7 mg by mouth daily. CBD Gummies     nitroGLYCERIN (NITROSTAT) 0.4 MG SL tablet Place 1 tablet (0.4 mg total) under the tongue every 5 (five) minutes as needed for chest pain. 25 tablet 6   olmesartan (BENICAR) 40  MG tablet Take 1 tablet (40 mg total) by mouth daily. 90 tablet 3   polyethylene glycol (MIRALAX / GLYCOLAX) 17 g packet Take by mouth as needed for moderate constipation.     PSYLLIUM PO Take by mouth.     Respiratory Therapy Supplies (FLUTTER) DEVI Use as directed 1 each 0   Simethicone 125 MG CAPS Take 1 capsule by mouth as needed.     sodium chloride HYPERTONIC 3 % nebulizer solution Take 4 mLs by nebulization as needed for chest congestion. 240 mL 5   tadalafil (CIALIS) 20 MG tablet Take one tablet (20 mg dose) by mouth daily as needed. 30 tablet 5   tetrahydrozoline-zinc (VISINE-AC) 0.05-0.25 % ophthalmic solution Apply to eye as needed (dry eyes).     vitamin E 180 MG (400 UNITS) capsule Take 400 Units by mouth daily.     No facility-administered medications prior to visit.    PAST  MEDICAL HISTORY: Past Medical History:  Diagnosis Date   Bronchiectasis (HCC)    Bronchiectasis (HCC)    CAP (community acquired pneumonia)    Carotid artery occlusion    Coronary atherosclerosis of unspecified type of vessel, native or graft    status post cardiac cath w percutaneous coronary intervention using XIENCE drug-eluting stent to mid left anterior descending 07/23/08; LHC 08/07/11: Mid LAD stent patent, moderate size diagonal branch jailed by stent with 70% ostial stenosis, unchanged from 2010 with excellent flow down the diagonal branch, mid circumflex 30%, proximal and mid RCA 20%, EF 60%.  Medical therapy continued.    GERD (gastroesophageal reflux disease)    Hepatitis C    Treated [place]   Hyperlipemia    Hypertension    Hypopotassemia    Lichen planus    Other and unspecified hyperlipidemia    Other specified cardiac dysrhythmias(427.89)    Paroxysmal supraventricular tachycardia    Personal history of other infectious and parasitic disease    Plantar fascial fibromatosis    Pneumonia    Syncope and collapse     PAST SURGICAL HISTORY: Past Surgical History:  Procedure Laterality Date   CATARACT EXTRACTION     CHOLECYSTECTOMY     COLONOSCOPY     colonscopy     status post   CORONARY ANGIOPLASTY WITH STENT PLACEMENT     ESOPHAGOGASTRODUODENOSCOPY     status post   INSERTION OF MESH N/A 01/30/2017   Procedure: INSERTION OF MESH;  Surgeon: Violeta Gelinas, MD;  Location: [facility];  Service: General;  Laterality: N/A;   KNEE ARTHROSCOPY     LEFT HEART CATHETERIZATION WITH CORONARY ANGIOGRAM  08/07/2011   Procedure: LEFT HEART CATHETERIZATION WITH CORONARY ANGIOGRAM;  Surgeon: Kathleene Hazel, MD;  Location: [facility] CATH LAB;  Service: Cardiovascular;;   UMBILICAL HERNIA REPAIR N/A 01/30/2017   Procedure: REPAIR OF UMBILICAL HERNIA;  Surgeon: Violeta Gelinas, MD;  Location: [facility];  Service: General;  Laterality: N/A;    FAMILY  HISTORY: Family History  Problem Relation Age of Onset   Heart disease Father    Coronary artery disease Brother        diagnosed in early 58s   Rectal cancer Brother    Coronary artery disease Other        hx family   Sleep apnea Neg Hx     SOCIAL HISTORY: Social History   Socioeconomic History   Marital status: Married    Spouse name: Not on file   Number of children: Not on file  Years of education: Not on file   Highest education level: Not on file  Occupational History   Occupation: Critical care RN  Tobacco Use   Smoking status: Former    Current packs/day: 0.00    Average packs/day: 2.0 packs/day for 25.0 years (50.0 ttl pk-yrs)    Types: Cigarettes    Start date: 20    Quit date: 46    Years since quitting: 35.5    Passive exposure: Never   Smokeless tobacco: Never  Vaping Use   Vaping status: Never Used  Substance and Sexual Activity   Alcohol use: Not Currently    Alcohol/week: 12.0 standard drinks of alcohol    Types: 4 Cans of beer, 8 Shots of liquor per week    Comment: social   Drug use: No    Comment: quit 25 years ago   Sexual activity: Not on file  Other Topics Concern   Not on file  Social History Narrative   Not on file   Social Determinants of Health   Financial Resource Strain: Low Risk  (10/21/2021)   Received from [facility]   Overall Financial Resource Strain (CARDIA)    Difficulty of Paying Living Expenses: Not hard at all  Food Insecurity: No Food Insecurity (10/21/2021)   [facility]   Hunger Vital Sign    Worried About Running Out of Food in the Last Year: Never true    Ran Out of Food in the Last Year: Never true  Transportation Needs: No Transportation Needs (10/21/2021)   Received from [facility] - Transportation    Lack of Transportation (Medical): No    Lack of Transportation (Non-Medical): No  Physical Activity: Sufficiently Active (10/21/2021)   Received from [facility]   Exercise  Vital Sign    Days of Exercise per Week: 6 days    Minutes of Exercise per Session: 60 min  Stress: No Stress Concern Present (10/21/2021)   Received from [facility] - Occupational Stress Questionnaire    Feeling of Stress : Only a little  Social Connections: Somewhat Isolated (10/19/2021)   Received from [facility]   Social Network    How would you rate your social network (family, work, friends)?: Restricted participation with some degree of social isolation  Intimate Partner Violence: Not At Risk (10/21/2021)   Received from [facility]   HITS    Over the last 12 months how often did your partner physically hurt you?: 1    Over the last 12 months how often did your partner insult you or talk down to you?: 1    Over the last 12 months how often did your partner threaten you with physical harm?: 1    Over the last 12 months how often did your partner scream or curse at you?: 1      PHYSICAL EXAM  Vitals:   09/11/22 1313  BP: 127/68  Pulse: (!) 54  Weight: 215 lb (97.5 kg)  Height: 5\' 11"  (1.803 m)   Body mass index is 29.99 kg/m.  Generalized: Well developed, in no acute distress  Chest: Lungs clear to auscultation bilaterally  Neurological examination  Mentation: Alert oriented to time, place, history taking. Follows all commands speech and language fluent Cranial nerve II-XII: Extraocular movements were full, visual field were full on confrontational test Head turning and shoulder shrug  were normal and symmetric. Motor: The motor testing reveals 5 over 5 strength  of all 4 extremities. Good symmetric motor tone is noted throughout.  Sensory: Sensory testing is intact to soft touch on all 4 extremities. No evidence of extinction is noted.  Gait and station: Gait is normal.    DIAGNOSTIC DATA (LABS, IMAGING, TESTING) - I reviewed patient records, labs, notes, testing and imaging myself where available.  Lab Results   Component Value Date   WBC 6.4 02/14/2021   HGB 14.7 02/14/2021   HCT 43.4 02/14/2021   MCV 99.8 02/14/2021   PLT 241 02/14/2021      Component Value Date/Time   NA 134 07/23/2021 1022   K 4.4 07/23/2021 1022   CL 97 07/23/2021 1022   CO2 19 (L) 07/23/2021 1022   GLUCOSE 113 (H) 07/23/2021 1022   GLUCOSE 109 (H) 04/19/2020 1503   BUN 16 07/23/2021 1022   CREATININE 0.97 07/23/2021 1022   CALCIUM 9.8 07/23/2021 1022   PROT 6.3 (L) 04/19/2020 1503   PROT 6.5 04/19/2018 1018   ALBUMIN 4.0 04/19/2020 1503   ALBUMIN 4.4 04/19/2018 1018   AST 30 04/19/2020 1503   ALT 25 04/19/2020 1503   ALKPHOS 45 04/19/2020 1503   BILITOT 1.1 04/19/2020 1503   BILITOT 0.6 04/19/2018 1018   GFRNONAA >60 04/19/2020 1503   GFRAA >60 09/25/2019 1112   Lab Results  Component Value Date   CHOL 120 04/19/2018   HDL 42 04/19/2018   LDLCALC 63 04/19/2018   TRIG 73 04/19/2018   CHOLHDL 2.9 04/19/2018   Lab Results  Component Value Date   TSH 1.940 04/19/2020      ASSESSMENT AND PLAN 77 y.o. year old male  has a past medical history of Bronchiectasis (HCC), Bronchiectasis (HCC), CAP (community acquired pneumonia), Carotid artery occlusion, Coronary atherosclerosis of unspecified type of vessel, native or graft, GERD (gastroesophageal reflux disease), Hepatitis C, Hyperlipemia, Hypertension, Hypopotassemia, Lichen planus, Other and unspecified hyperlipidemia, Other specified cardiac dysrhythmias(427.89), Paroxysmal supraventricular tachycardia, Personal history of other infectious and parasitic disease, Plantar fascial fibromatosis, Pneumonia, and Syncope and collapse. here with:  OSA on CPAP  - CPAP compliance excellent - Good treatment of AHI  - Encourage patient to use CPAP nightly and > 4 hours each night - F/U in 1 year or sooner if needed     Butch Penny, MSN, NP-C 09/11/2022, 1:38 PM [facility] [address] [place], [place] [address] [phone]

## 2022-09-11 ENCOUNTER — Encounter: Payer: Self-pay | Admitting: Adult Health

## 2022-09-11 ENCOUNTER — Ambulatory Visit: Payer: PPO | Admitting: Adult Health

## 2022-09-11 VITALS — BP 127/68 | HR 54 | Ht 71.0 in | Wt 215.0 lb

## 2022-09-11 DIAGNOSIS — G4733 Obstructive sleep apnea (adult) (pediatric): Secondary | ICD-10-CM

## 2022-09-11 NOTE — Patient Instructions (Signed)
Continue using CPAP nightly and greater than 4 hours each night °If your symptoms worsen or you develop new symptoms please let us know.  ° °

## 2022-10-22 ENCOUNTER — Other Ambulatory Visit (HOSPITAL_BASED_OUTPATIENT_CLINIC_OR_DEPARTMENT_OTHER): Payer: Self-pay

## 2022-10-23 ENCOUNTER — Other Ambulatory Visit (HOSPITAL_BASED_OUTPATIENT_CLINIC_OR_DEPARTMENT_OTHER): Payer: Self-pay

## 2022-10-23 ENCOUNTER — Other Ambulatory Visit: Payer: Self-pay

## 2022-10-23 MED ORDER — ATORVASTATIN CALCIUM 20 MG PO TABS
20.0000 mg | ORAL_TABLET | Freq: Every day | ORAL | 0 refills | Status: DC
  Filled 2022-10-23: qty 90, 90d supply, fill #0

## 2022-12-22 ENCOUNTER — Encounter (HOSPITAL_COMMUNITY): Payer: PPO

## 2022-12-22 ENCOUNTER — Ambulatory Visit: Payer: PPO

## 2023-01-02 ENCOUNTER — Telehealth: Payer: Self-pay | Admitting: Cardiovascular Disease

## 2023-01-02 ENCOUNTER — Other Ambulatory Visit: Payer: Self-pay

## 2023-01-02 DIAGNOSIS — I6523 Occlusion and stenosis of bilateral carotid arteries: Secondary | ICD-10-CM

## 2023-01-02 NOTE — Telephone Encounter (Signed)
High on/off for the last month - this week it has been consistently high.  Today it has been more normal - 140/ both times he checked.  HRs running 48-60. Has felt a lot of stress this week.    Has gained 10 pounds since August.    Just ate burger and fries at a restaurant and added salt.  He is going to try to decrease sodium and not add salt.   Taking meds as prescribed.  I've asked him to monitor over the weekend since both readings today were 140/.  He is going to send in for Dr. [name] to review on Monday.  Taking Lopressor 25 BID and Benicar 40 mg at bedtime.

## 2023-01-02 NOTE — Telephone Encounter (Signed)
Pt c/o BP issue: STAT if pt c/o blurred vision, one-sided weakness or slurred speech  1. What are your last 5 BP readings?  170/77 162/79 160/74 140/66 145/79 - this morning    2. Are you having any other symptoms (ex. Dizziness, headache, blurred vision, passed out)? No   3. What is your BP issue? Patient states that last night he felt flushed and BP is staying higher and would like to speak to someone in regards to this.

## 2023-01-16 NOTE — Progress Notes (Unsigned)
HISTORY AND PHYSICAL     CC:  follow up. Requesting Provider:  [name], MD  HPI: This is a 76 y.o. male here for follow up for carotid artery stenosis.  He was found to have carotid artery stenosis in June 2021 during work-up for syncopal episode.    Pt was last seen 09/02/2021 and at that time he was not having any neurological sx.  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   Pt returns today for follow up.    Pt denies any amaurosis fugax, speech difficulties, weakness, numbness, paralysis or clumsiness or facial droop.  He states he has some tingling in both heels that started yesterday.  He denies any claudication or rest pain.  He does not have any non healing wounds.   He states that over the summer, he learned that two brothers have aneurysms, one is thoracic and one is aortic.  He gets regular scans at [facility] and he has not had any evidence of aneurysm.    The pt is on a statin for cholesterol management.  The pt is on a daily aspirin.   Other AC:  none The pt is on ARB, BB for hypertension.   The pt is not on medication for diabetes Tobacco hx:  former  Pt does  have family hx of AAA.  Past Medical History:  Diagnosis Date   Bronchiectasis (HCC)    Bronchiectasis (HCC)    CAP (community acquired pneumonia)    Carotid artery occlusion    Coronary atherosclerosis of unspecified type of vessel, native or graft    status post cardiac cath w percutaneous coronary intervention using XIENCE drug-eluting stent to mid left anterior descending 07/23/08; LHC 08/07/11: Mid LAD stent patent, moderate size diagonal branch jailed by stent with 70% ostial stenosis, unchanged from 2010 with excellent flow down the diagonal branch, mid circumflex 30%, proximal and mid RCA 20%, EF 60%.  Medical therapy continued.    GERD (gastroesophageal reflux disease)    Hepatitis C    Treated Harmony   Hyperlipemia    Hypertension     Hypopotassemia    Lichen planus    Other and unspecified hyperlipidemia    Other specified cardiac dysrhythmias(427.89)    Paroxysmal supraventricular tachycardia    Personal history of other infectious and parasitic disease    Plantar fascial fibromatosis    Pneumonia    Syncope and collapse     Past Surgical History:  Procedure Laterality Date   CATARACT EXTRACTION     CHOLECYSTECTOMY     COLONOSCOPY     colonscopy     status post   CORONARY ANGIOPLASTY WITH STENT PLACEMENT     ESOPHAGOGASTRODUODENOSCOPY     status post   INSERTION OF MESH N/A 01/30/2017   Procedure: INSERTION OF MESH;  Surgeon: Violeta Gelinas, MD;  Location: [facility];  Service: General;  Laterality: N/A;   KNEE ARTHROSCOPY     LEFT HEART CATHETERIZATION WITH CORONARY ANGIOGRAM  08/07/2011   Procedure: LEFT HEART CATHETERIZATION WITH CORONARY ANGIOGRAM;  Surgeon: Kathleene Hazel, MD;  Location: [facility] CATH LAB;  Service: Cardiovascular;;   UMBILICAL HERNIA REPAIR N/A 01/30/2017   Procedure: REPAIR OF UMBILICAL HERNIA;  Surgeon: Violeta Gelinas, MD;  Location: [facility];  Service: General;  Laterality: N/A;    Allergies  Allergen Reactions   Amlodipine Swelling and Other (See Comments)    Ankle swelling  Current Outpatient Medications  Medication Sig Dispense Refill   albuterol (PROVENTIL) (2.5 MG/3ML) 0.083% nebulizer solution Take 3 mLs (2.5 mg total) by nebulization every 6 (six) hours as needed for wheezing or shortness of breath. 360 mL 10   aspirin EC 81 MG tablet Take 1 tablet (81 mg total) by mouth daily.     atorvastatin (LIPITOR) 20 MG tablet Take 1 tablet (20 mg total) by mouth daily. 90 tablet 0   B Complex-C (SUPER B COMPLEX PO) Take 1 tablet by mouth daily.     Calcium Carbonate-Vitamin D (CALCIUM-D PO) Take 1-1.5 tablets by mouth See admin instructions. Take 1 tablet in the morning and 1.5 tablets at night     Cholecalciferol (VITAMIN D) 50 MCG (2000  UT) tablet Take 2,000 Units by mouth daily.     dextromethorphan-guaiFENesin (ROBITUSSIN-DM) 10-100 MG/5ML liquid Take 10 mLs by mouth every 6 (six) hours as needed for cough.     diphenhydrAMINE (BENADRYL) 25 MG tablet Take 25 mg by mouth daily as needed for allergies.     Docusate Sodium (DSS) 100 MG CAPS Take 1 capsule by mouth 2 (two) times daily.     doxycycline (VIBRA-TABS) 100 MG tablet Take 1 tablet (100 mg total) by mouth 2 (two) times daily. 14 tablet 0   famotidine (PEPCID) 20 MG tablet Take 1 tablet (20 mg total) by mouth daily. 90 tablet 3   Fiber POWD Take 1 Scoop by mouth daily as needed (constipation).     fluticasone (CUTIVATE) 0.05 % cream Apply one application topically as needed. 15 g 0   ibuprofen (ADVIL) 200 MG tablet Take by mouth as needed for headache or moderate pain.     magnesium oxide (MAG-OX) 400 MG tablet Take by mouth.     metoprolol tartrate (LOPRESSOR) 25 MG tablet Take 1 tablet (25 mg total) by mouth 2 (two) times daily. 180 tablet 2   MISC NATURAL PRODUCTS PO Take 7 mg by mouth daily. CBD Gummies     nitroGLYCERIN (NITROSTAT) 0.4 MG SL tablet Place 1 tablet (0.4 mg total) under the tongue every 5 (five) minutes as needed for chest pain. 25 tablet 6   olmesartan (BENICAR) 40 MG tablet Take 1 tablet (40 mg total) by mouth daily. 90 tablet 3   polyethylene glycol (MIRALAX / GLYCOLAX) 17 g packet Take by mouth as needed for moderate constipation.     PSYLLIUM PO Take by mouth.     Respiratory Therapy Supplies (FLUTTER) DEVI Use as directed 1 each 0   Simethicone 125 MG CAPS Take 1 capsule by mouth as needed.     sodium chloride HYPERTONIC 3 % nebulizer solution Take 4 mLs by nebulization as needed for chest congestion. 240 mL 5   tadalafil (CIALIS) 20 MG tablet Take one tablet (20 mg dose) by mouth daily as needed. 30 tablet 5   tetrahydrozoline-zinc (VISINE-AC) 0.05-0.25 % ophthalmic solution Apply to eye as needed (dry eyes).     vitamin E 180 MG (400 UNITS)  capsule Take 400 Units by mouth daily.     No current facility-administered medications for this visit.    Family History  Problem Relation Age of Onset   Heart disease Father    Coronary artery disease Brother        diagnosed in early 21s   Rectal cancer Brother    Coronary artery disease Other        hx family   Sleep apnea Neg Hx  Social History   Socioeconomic History   Marital status: Married    Spouse name: Not on file   Number of children: Not on file   Years of education: Not on file   Highest education level: Not on file  Occupational History   Occupation: Critical care RN  Tobacco Use   Smoking status: Former    Current packs/day: 0.00    Average packs/day: 2.0 packs/day for 25.0 years (50.0 ttl pk-yrs)    Types: Cigarettes    Start date: 71    Quit date: 75    Years since quitting: 35.9    Passive exposure: Never   Smokeless tobacco: Never  Vaping Use   Vaping status: Never Used  Substance and Sexual Activity   Alcohol use: Not Currently    Alcohol/week: 12.0 standard drinks of alcohol    Types: 4 Cans of beer, 8 Shots of liquor per week    Comment: social   Drug use: No    Comment: quit 25 years ago   Sexual activity: Not on file  Other Topics Concern   Not on file  Social History Narrative   Not on file   Social Determinants of Health   Financial Resource Strain: Low Risk  (10/27/2022)   Received from [facility]   Overall Financial Resource Strain (CARDIA)    Difficulty of Paying Living Expenses: Not hard at all  Food Insecurity: No Food Insecurity (10/27/2022)   Received from [facility]   Hunger Vital Sign    Worried About Running Out of Food in the Last Year: Never true    Ran Out of Food in the Last Year: Never true  Transportation Needs: No Transportation Needs (10/27/2022)   Received from [facility] - Transportation    Lack of Transportation (Medical): No    Lack of Transportation (Non-Medical): No  Physical  Activity: Sufficiently Active (10/27/2022)   Received from [facility]   Exercise Vital Sign    Days of Exercise per Week: 5 days    Minutes of Exercise per Session: 40 min  Stress: No Stress Concern Present (10/27/2022)   Received from [facility] - Occupational Stress Questionnaire    Feeling of Stress : Not at all  Social Connections: Somewhat Isolated (10/27/2022)   Received from [facility]   Social Network    How would you rate your social network (family, work, friends)?: Restricted participation with some degree of social isolation  Intimate Partner Violence: Not At Risk (10/27/2022)   Received from [facility]   HITS    Over the last 12 months how often did your partner physically hurt you?: Never    Over the last 12 months how often did your partner insult you or talk down to you?: Never    Over the last 12 months how often did your partner threaten you with physical harm?: Never    Over the last 12 months how often did your partner scream or curse at you?: Never     REVIEW OF SYSTEMS:   [X]  denotes positive finding, [ ]  denotes negative finding Cardiac  Comments:  Chest pain or chest pressure:    Shortness of breath upon exertion:    Short of breath when lying flat:    Irregular heart rhythm:        Vascular    Pain in calf, thigh, or hip brought on by ambulation:    Pain in feet at  night that wakes you up from your sleep:     Blood clot in your veins:    Leg swelling:         Pulmonary    Oxygen at home:    Productive cough:     Wheezing:         Neurologic    Sudden weakness in arms or legs:     Sudden numbness in arms or legs:     Sudden onset of difficulty speaking or slurred speech:    Temporary loss of vision in one eye:     Problems with dizziness:         Gastrointestinal    Blood in stool:     Vomited blood:         Genitourinary    Burning when urinating:     Blood in urine:        Psychiatric     Major depression:         Hematologic    Bleeding problems:    Problems with blood clotting too easily:        Skin    Rashes or ulcers:        Constitutional    Fever or chills:      PHYSICAL EXAMINATION:  Today's Vitals   01/19/23 0830 01/19/23 0832  BP: (!) 157/77 (!) 156/78  Pulse: (!) 44   Temp: 97.9 F (36.6 C)   TempSrc: Temporal   SpO2: 97%   Weight: 225 lb 14.4 oz (102.5 kg)   Height: 5\' 11"  (1.803 m)    Body mass index is 31.51 kg/m.   General:  WDWN in NAD; vital signs documented above Gait: Not observed HENT: WNL, normocephalic Pulmonary: normal non-labored breathing Cardiac: regular HR, without carotid bruits Abdomen: soft, NT; aortic pulse is not palpable Skin: without rashes Vascular Exam/Pulses:  Right Left  Radial 2+ (normal) 2+ (normal)  Popliteal Unable to palpate Unable to palpate  DP + doppler monophasic Unable to obtain with doppler  PT 2+ (normal) Brisk biphasic  Peroneal Brisk doppler biphasic monophasic   Extremities: without open wounds Musculoskeletal: no muscle wasting or atrophy  Neurologic: A&O X 3; moving all extremities equally; speech is fluent/normal Psychiatric:  The pt has Normal affect.   Non-Invasive Vascular Imaging:   Carotid Duplex on 01/19/2023 Right:  60-79% ICA stenosis Left:  1-39% ICA stenosis Vertebrals:  Bilateral vertebral arteries demonstrate antegrade flow.  Subclavians: Normal flow hemodynamics were seen in bilateral subclavian arteries.   Previous Carotid duplex on 09/02/2021: Right: 40-59% ICA stenosis Left:   1-39% ICA stenosis    ASSESSMENT/PLAN:: 77 y.o. male here for follow up carotid artery stenosis   -duplex today reveals an increase to the low end of 60-79% right ICA stenosis.  The left remains 1-39%.  He remains asymptomatic.   -discussed s/s of stroke with pt and he understands should he develop any of these sx, he will go to the nearest ER or call 911. -pt will f/u in 6 months with  carotid duplex -pt will call sooner should he have any issues. -continue statin/asa -he does have some new tingling sensation in both heels.  Will get ABI when he returns.  He does not have any claudication, rest pain or non healing wounds.    Doreatha Massed, [facility] [phone]  Clinic MD:  Hetty Blend on call MD

## 2023-01-19 ENCOUNTER — Ambulatory Visit: Payer: PPO | Admitting: Physician Assistant

## 2023-01-19 ENCOUNTER — Encounter: Payer: Self-pay | Admitting: Physician Assistant

## 2023-01-19 ENCOUNTER — Ambulatory Visit (HOSPITAL_COMMUNITY)
Admission: RE | Admit: 2023-01-19 | Discharge: 2023-01-19 | Disposition: A | Discharge status: Home / Self Care | Payer: PPO | Source: Ambulatory Visit | Attending: Surgery | Admitting: Surgery

## 2023-01-19 ENCOUNTER — Other Ambulatory Visit (HOSPITAL_BASED_OUTPATIENT_CLINIC_OR_DEPARTMENT_OTHER): Payer: Self-pay

## 2023-01-19 ENCOUNTER — Other Ambulatory Visit: Payer: Self-pay | Admitting: Cardiovascular Disease

## 2023-01-19 VITALS — BP 156/78 | HR 44 | Temp 97.9°F | Ht 71.0 in | Wt 225.9 lb

## 2023-01-19 DIAGNOSIS — I6523 Occlusion and stenosis of bilateral carotid arteries: Secondary | ICD-10-CM

## 2023-01-20 ENCOUNTER — Other Ambulatory Visit: Payer: Self-pay

## 2023-01-20 ENCOUNTER — Other Ambulatory Visit (HOSPITAL_BASED_OUTPATIENT_CLINIC_OR_DEPARTMENT_OTHER): Payer: Self-pay

## 2023-01-20 DIAGNOSIS — I6523 Occlusion and stenosis of bilateral carotid arteries: Secondary | ICD-10-CM

## 2023-01-20 DIAGNOSIS — R202 Paresthesia of skin: Secondary | ICD-10-CM

## 2023-01-20 MED ORDER — ATORVASTATIN CALCIUM 20 MG PO TABS
20.0000 mg | ORAL_TABLET | Freq: Every day | ORAL | 0 refills | Status: DC
  Filled 2023-01-20: qty 90, 90d supply, fill #0

## 2023-01-20 MED ORDER — METOPROLOL TARTRATE 25 MG PO TABS
25.0000 mg | ORAL_TABLET | Freq: Two times a day (BID) | ORAL | 1 refills | Status: DC
  Filled 2023-01-20: qty 180, 90d supply, fill #0

## 2023-01-26 ENCOUNTER — Other Ambulatory Visit (HOSPITAL_BASED_OUTPATIENT_CLINIC_OR_DEPARTMENT_OTHER): Payer: Self-pay

## 2023-01-26 MED ORDER — FAMOTIDINE 20 MG PO TABS
20.0000 mg | ORAL_TABLET | Freq: Every day | ORAL | 3 refills | Status: DC
  Filled 2023-01-26 – 2023-02-14 (×2): qty 90, 90d supply, fill #0
  Filled 2023-05-12: qty 90, 90d supply, fill #1
  Filled 2023-08-05: qty 90, 90d supply, fill #2
  Filled 2023-11-07: qty 90, 90d supply, fill #3

## 2023-01-28 ENCOUNTER — Other Ambulatory Visit (HOSPITAL_BASED_OUTPATIENT_CLINIC_OR_DEPARTMENT_OTHER): Payer: Self-pay

## 2023-01-28 MED ORDER — FLUTICASONE PROPIONATE 0.005 % EX OINT
1.0000 | TOPICAL_OINTMENT | Freq: Two times a day (BID) | CUTANEOUS | 5 refills | Status: DC | PRN
  Filled 2023-01-28: qty 60, 30d supply, fill #0

## 2023-01-29 ENCOUNTER — Other Ambulatory Visit (HOSPITAL_BASED_OUTPATIENT_CLINIC_OR_DEPARTMENT_OTHER): Payer: Self-pay

## 2023-02-16 ENCOUNTER — Other Ambulatory Visit (HOSPITAL_BASED_OUTPATIENT_CLINIC_OR_DEPARTMENT_OTHER): Payer: Self-pay

## 2023-02-26 ENCOUNTER — Telehealth: Payer: Self-pay | Admitting: Cardiovascular Disease

## 2023-02-26 NOTE — Telephone Encounter (Signed)
  Per MyChart scheduling message:   Pt c/o BP issue: STAT if pt c/o blurred vision, one-sided weakness or slurred speech  1. What are your last 5 BP readings?   2. Are you having any other symptoms (ex. Dizziness, headache, blurred vision, passed out)?   3. What is your BP issue?    I'm not having any neuro symptoms. Just BP running higher that the 140 systolic that the doctor recommended.  Monday 12/30 142/74, Tuesday 157/71, Wed 162/75, Thurs 175/77. On the evening of 12/30 I had chest tightness when bringing groceries in from the car. Relief with one nitro. At that time my BP was 140/66. Right now my BP is 129/62 with HR 66 at 3 pm

## 2023-02-26 NOTE — Telephone Encounter (Signed)
 Spoke with patient and he states his BP has been above his goal. He states  this morning it was 175/77 hr 50 at 1030 am and this  afternoon 129/62 3pm. He states his SBP is usually above 140. He does not add salt to his food.  He stated he called last month with concerns and his BP is still above the goal provider set for him consistently. (SBP goal 140 or below).   He also stated on Monday while bringing in groceries he was SOB and had chest tightness. He took nitroglycerin  and it went away.  Has not happened since. ED precautions discussed.  12/30 142/74 12/31 157/71 1/1 162/75  Will forward to provider.

## 2023-03-03 ENCOUNTER — Other Ambulatory Visit (HOSPITAL_BASED_OUTPATIENT_CLINIC_OR_DEPARTMENT_OTHER): Payer: Self-pay

## 2023-03-03 MED ORDER — HYDROCHLOROTHIAZIDE 25 MG PO TABS
25.0000 mg | ORAL_TABLET | Freq: Every day | ORAL | 3 refills | Status: DC
  Filled 2023-03-03: qty 90, 90d supply, fill #0

## 2023-03-03 NOTE — Telephone Encounter (Signed)
 Called and spoke w patient.  He is in agreement to start hydrochlorothiazide  25 mg daily.  Sent rx to pharmacy.  He added that he had been having some right flank pain over the last few weeks and wonders if he has a kidney stone and that is adding to his bp elevations.  He had no pain last week when BP was 175/77.    Schedule with APP 03/17/22.  Offered sooner but patient will be out of town all next week on a cruise.  He is taking BP cuff with him.  Appreciative for assistance provided today.

## 2023-03-18 ENCOUNTER — Ambulatory Visit: Payer: PPO | Attending: Physician Assistant | Admitting: Physician Assistant

## 2023-03-18 ENCOUNTER — Encounter: Payer: Self-pay | Admitting: Physician Assistant

## 2023-03-18 ENCOUNTER — Other Ambulatory Visit (HOSPITAL_BASED_OUTPATIENT_CLINIC_OR_DEPARTMENT_OTHER): Payer: Self-pay

## 2023-03-18 VITALS — BP 136/84 | HR 83 | Ht 71.0 in | Wt 230.6 lb

## 2023-03-18 DIAGNOSIS — R072 Precordial pain: Secondary | ICD-10-CM

## 2023-03-18 DIAGNOSIS — I6523 Occlusion and stenosis of bilateral carotid arteries: Secondary | ICD-10-CM

## 2023-03-18 DIAGNOSIS — R0602 Shortness of breath: Secondary | ICD-10-CM

## 2023-03-18 DIAGNOSIS — I10 Essential (primary) hypertension: Secondary | ICD-10-CM

## 2023-03-18 DIAGNOSIS — E7800 Pure hypercholesterolemia, unspecified: Secondary | ICD-10-CM | POA: Diagnosis not present

## 2023-03-18 DIAGNOSIS — I25119 Atherosclerotic heart disease of native coronary artery with unspecified angina pectoris: Secondary | ICD-10-CM

## 2023-03-18 MED ORDER — METOPROLOL TARTRATE 50 MG PO TABS
50.0000 mg | ORAL_TABLET | Freq: Two times a day (BID) | ORAL | 3 refills | Status: DC
  Filled 2023-03-18: qty 180, 90d supply, fill #0
  Filled 2023-06-15: qty 180, 90d supply, fill #1
  Filled 2023-09-10: qty 180, 90d supply, fill #2
  Filled 2023-12-13: qty 180, 90d supply, fill #3

## 2023-03-18 NOTE — Assessment & Plan Note (Signed)
BP is improved on hydrochlorothiazide. 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 He drinks ~4 beers several times a week and drinks a lot of water per day. Low Na likely due to beer, polydipsia. We discussed limiting beer and water to help -BMET -Continue hydrochlorothiazide 25 mg once daily, Olmesartan 40 mg once daily -Increase Metoprolol to 50 mg twice daily as noted

## 2023-03-18 NOTE — Progress Notes (Signed)
Cardiology Office Note:    Date:  03/18/2023  ID:  Tommy Santiago, DOB [birth year], MRN [id number] PCP: Tommy Harries, MD  [facility] Providers Cardiologist:  Tommy Carrow, MD       Patient Profile:      Coronary artery disease  S/p 2.75 x 18 mm DES to mLAD in 2010 ETT 08/16/19: no ischemia TTE 05/18/20:EF 60-65, no RWMA, mild LVH, Gr 2 DD, NL RVSF, AV sclerosis, RAP 3 LHC 08/06/2021: LAD mid stent patent, Dx jailed ostial 70; LCx mid 30; RCA proximal mid 20; EF 60 Supraventricular Tachycardia  Rx w beta-blocker Monitor 04/2020: PACs 10.3%, PVCs < 1%, SVT runs (longest 2'46")  Carotid artery disease [place] 01/19/23: RICA 60-79, LICA 1-39  Hypertension  Hyperlipidemia  Ex smoker Bronchiectasis  Hepatocellular CA  Hep C         History of Present Illness:  Discussed the use of AI scribe software for clinical note transcription with the patient, who gave verbal consent to proceed.  Tommy Santiago is a 78 y.o. male who returns for evaluation of chest pain and uncontrolled blood pressure.  He was last seen in May 2024 by  Tommy Reedy, PA-C. He called in recently with blood pressure 175/77.  He also noted some shortness of breath and chest tightness with activity. He is here alone. He presents with recent episodes of chest tightness. The onset of these symptoms was in December, during physical exertion such as carrying groceries or climbing stairs. The chest tightness is substernal and was relieved within 5-6 minutes after taking nitroglycerin or resting. The patient reports that these episodes are not consistent and do not occur with every physical activity. He also reports occasional shortness of breath during these episodes. The patient has noticed an increase in these episodes of chest tightness and shortness of breath recently but denies any progression in severity. He denies any similar symptoms since his stent placement in 2010. The patient also reports a significant  improvement in blood pressure since starting hydrochlorothiazide two weeks ago. The patient denies any recent fevers, vomiting, diarrhea, or changes in stool color. He denies any recent episodes of syncope. The patient admits to drinking beer 4 days a week and consuming a large amount of water daily. He also reports a recent weight gain of 15 pounds since September.     Review of Systems  Gastrointestinal:  Negative for hematochezia and melena.  Genitourinary:  Negative for hematuria.  -See HPI    Studies Reviewed:   EKG Interpretation Date/Time:  Wednesday March 18 2023 14:45:55 EST Ventricular Rate:  83 PR Interval:  194 QRS Duration:  82 QT Interval:  358 QTC Calculation: 420 R Axis:   48  Text Interpretation: Normal sinus rhythm Normal ECG No significant change since last tracing since tracing 07/10/2021 Confirmed by Tereso Newcomer [phone]) on 03/18/2023 3:13:23 PM    Results   LABS Reviewed - CareEverywhere  Creatinine: 1.22 mg/dL (62/13/0865) Sodium: 784 mmol/L (10/30/2022) Potassium: 5 mmol/L (10/30/2022) ALT: 23 U/L (10/30/2022) Total cholesterol: 145 mg/dL (69/62/9528) Triglycerides: 91 mg/dL (41/32/4401) HDL: 59 mg/dL (02/72/5366) LDL: 69 mg/dL (44/04/4740)      Risk Assessment/Calculations:             Physical Exam:   VS:  BP 136/84   Pulse 83   Ht 5\' 11"  (1.803 m)   Wt 230 lb 9.6 oz (104.6 kg)   SpO2 98%   BMI 32.16 kg/m    Wt Readings from Last  3 Encounters:  03/18/23 230 lb 9.6 oz (104.6 kg)  01/19/23 225 lb 14.4 oz (102.5 kg)  09/11/22 215 lb (97.5 kg)    Constitutional:      Appearance: Healthy appearance. Not in distress.  Neck:     Vascular: No JVR. JVD normal.  Pulmonary:     Breath sounds: Normal breath sounds. No wheezing. No rales.  Cardiovascular:     Normal rate. Regular rhythm.     Murmurs: There is a grade 1/6 systolic murmur at the URSB.  Edema:    Peripheral edema absent.  Abdominal:     Palpations: Abdomen is soft.        Assessment and Plan:   Assessment & Plan Coronary artery disease involving native coronary artery of native heart with angina pectoris (HCC) Hx of DES to LAD in 2010. Cath in 2023 with jailed Dx and patent LAD stent. He has noted occasional exertional chest pain described as tightness as well as dyspnea on exertion. He has taken NTG on an couple of occasions with relief. He has been able to perform activities w/o chest pain and his symptoms are not progressing. His EKG does not show acute changes. He has taken PDE-5 inhibitors in the past but is no longer on them. He has an intol to Amlodipine due to edema. I think it is reasonable to advance medical Rx and proceed with functional testing. -Exercise Myoview -Echocardiogram  -Increase Metoprolol tartrate to 50 mg twice daily  -Continue ASA 81 mg once daily, Atorvastatin 20 mg once daily, NTG prn -Follow up 6-8 weeks Shortness of breath He has a systolic murmur which is likely due to AV sclerosis. His last echocardiogram was done in 2022. -Exercise Myoview as noted -Echocardiogram  Essential hypertension BP is improved on hydrochlorothiazide. 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 He drinks ~4 beers several times a week and drinks a lot of water per day. Low Na likely due to beer, polydipsia. We discussed limiting beer and water to help -BMET -Continue hydrochlorothiazide 25 mg once daily, Olmesartan 40 mg once daily -Increase Metoprolol to 50 mg twice daily as noted Pure hypercholesterolemia LDL close to goal in Sept 9528. -Continue Atorvastatin 20 mg once daily  Bilateral carotid artery stenosis Followed by vascular surgery     Informed Consent   Shared Decision Making/Informed Consent The risks [chest pain, shortness of breath, cardiac arrhythmias, dizziness, blood pressure fluctuations, myocardial infarction, stroke/transient ischemic attack, nausea, vomiting, allergic reaction, radiation exposure, metallic taste sensation and life-threatening  complications (estimated to be 1 in 10,000)], benefits (risk stratification, diagnosing coronary artery disease, treatment guidance) and alternatives of a nuclear stress test were discussed in detail with Tommy Santiago. Character and he agrees to proceed.     Dispo:  Return in about 8 weeks (around 05/13/2023) for Follow up after testing, w/ Dr. [name], or Tereso Newcomer, PA-C.  Signed, Tereso Newcomer, PA-C

## 2023-03-18 NOTE — Patient Instructions (Signed)
Medication Instructions:  Increase metoprolol tartrate (LOPRESSOR) to 50mg  twice a day    *If you need a refill on your cardiac medications before your next appointment, please call your pharmacy*   Lab Work: BMET    If you have labs (blood work) drawn today and your tests are completely normal, you will receive your results only by: MyChart Message (if you have MyChart) OR A paper copy in the mail If you have any lab test that is abnormal or we need to change your treatment, we will call you to review the results.   Testing/Procedures: Echo will be scheduled at [address]  Your physician has requested that you have an echocardiogram. Echocardiography is a painless test that uses sound waves to create images of your heart. It provides your doctor with information about the size and shape of your heart and how well your heart's chambers and valves are working. This procedure takes approximately one hour. There are no restrictions for this procedure. Please do NOT wear cologne, perfume, aftershave, or lotions (deodorant is allowed). Please arrive 15 minutes prior to your appointment time.      Your doctor has scheduled you for a Myocardial Perfusion scan to obtain information about the blood flow to your heart. The test consists of taking pictures of your heart in two phases: while resting and after a stress test.  The stress test may involve walking on a treadmill, or if you are unable to exercise adequately, you will be given a drug intended to have a similar effect on the heart to that of exercise.  The test will take approximately 3 to 4  hours to complete.  If you are pregnant or breastfeeding,  please notify the staff prior to your test.  How to prepare for your test: Do not eat or drink 2 hours prior to your test Do not consume products containing caffeine 12 hours prior to your test (examples: coffee (regular OR decaf), chocolate, sodas, tea) Your doctor may need  you to hold certain medications prior to the test.  If so, these are listed below and should not be taken for 24 hours prior to the test.  If not listed below, you may take your medications as normal.  You may resume taking held medications on your normal schedule once the test is complete.   Meds to hold: None  Do bring a list of your current medications with you.  If you have held any meds in preparation for the test, please bring them, as you may be required to take them once the test is completed. Do wear comfortable clothes and walking shoes.  Do not wear dresses or overalls. Do NOT wear cologne, perfume, aftershave, or fragranced lotions the day of your test (deodorants okay). If these instructions are not followed your test will have to be rescheduled.   A nuclear cardiologist will review your test, prepare a report and send it to your physician.   If you have questions or concerns about your appointment, you can call the Nuclear Cardiology department at [phone]. If you cannot keep your appointment, please provide 48 hours notification to avoid a possible $50.00 charge to your account.   Please arrive 15 minutes prior to your appointment time for registration and insurance purposes    Follow-Up: At [facility], you and your health needs are our priority.  As part of our continuing mission to provide you with exceptional heart care, we have created  designated Provider Care Teams.  These Care Teams include your primary Cardiologist (physician) and Advanced Practice Providers (APPs -  Physician Assistants and Nurse Practitioners) who all work together to provide you with the care you need, when you need it.  We recommend signing up for the patient portal called "MyChart".  Sign up information is provided on this After Visit Summary.  MyChart is used to connect with patients for Virtual Visits (Telemedicine).  Patients are able to view lab/test results, encounter notes, upcoming  appointments, etc.  Non-urgent messages can be sent to your provider as well.   To learn more about what you can do with MyChart, go to [email or web address].    Your next appointment:   6-8 week(s)  The format for your next appointment:   In Person  Provider:   Tereso Newcomer, PA-C        Other Instructions

## 2023-03-18 NOTE — Addendum Note (Signed)
Addended byTereso Newcomer T on: 03/18/2023 04:59 PM   Modules accepted: Orders

## 2023-03-18 NOTE — Assessment & Plan Note (Signed)
LDL close to goal in Sept 2952. -Continue Atorvastatin 20 mg once daily

## 2023-03-18 NOTE — Assessment & Plan Note (Signed)
Hx of DES to LAD in 2010. Cath in 2023 with jailed Dx and patent LAD stent. He has noted occasional exertional chest pain described as tightness as well as dyspnea on exertion. He has taken NTG on an couple of occasions with relief. He has been able to perform activities w/o chest pain and his symptoms are not progressing. His EKG does not show acute changes. He has taken PDE-5 inhibitors in the past but is no longer on them. He has an intol to Amlodipine due to edema. I think it is reasonable to advance medical Rx and proceed with functional testing. -Exercise Myoview -Echocardiogram  -Increase Metoprolol tartrate to 50 mg twice daily  -Continue ASA 81 mg once daily, Atorvastatin 20 mg once daily, NTG prn -Follow up 6-8 weeks

## 2023-03-19 ENCOUNTER — Other Ambulatory Visit (HOSPITAL_BASED_OUTPATIENT_CLINIC_OR_DEPARTMENT_OTHER): Payer: Self-pay

## 2023-03-19 ENCOUNTER — Encounter (HOSPITAL_COMMUNITY): Payer: Self-pay

## 2023-03-19 ENCOUNTER — Telehealth: Payer: Self-pay

## 2023-03-19 DIAGNOSIS — Z79899 Other long term (current) drug therapy: Secondary | ICD-10-CM

## 2023-03-19 DIAGNOSIS — I25119 Atherosclerotic heart disease of native coronary artery with unspecified angina pectoris: Secondary | ICD-10-CM

## 2023-03-19 LAB — BASIC METABOLIC PANEL
BUN/Creatinine Ratio: 17 (ref 10–24)
BUN: 21 mg/dL (ref 8–27)
CO2: 20 mmol/L (ref 20–29)
Calcium: 9.8 mg/dL (ref 8.6–10.2)
Chloride: 91 mmol/L — ABNORMAL LOW (ref 96–106)
Creatinine, Ser: 1.22 mg/dL (ref 0.76–1.27)
Glucose: 99 mg/dL (ref 70–99)
Potassium: 4.4 mmol/L (ref 3.5–5.2)
Sodium: 129 mmol/L — ABNORMAL LOW (ref 134–144)
eGFR: 62 mL/min/{1.73_m2} (ref >59)

## 2023-03-19 MED ORDER — HYDROCHLOROTHIAZIDE 25 MG PO TABS
12.5000 mg | ORAL_TABLET | Freq: Every day | ORAL | 3 refills | Status: DC
  Filled 2023-03-19: qty 45, 90d supply, fill #0

## 2023-03-19 NOTE — Telephone Encounter (Signed)
-----   Message from Tereso Newcomer sent at 03/19/2023  8:48 AM EST ----- Result note sent to [name] via MyChart. See comments below. PLAN:  -Decrease hydrochlorothiazide to 12.5 mg once daily  -Restrict water to 60 ounces or less per day -Limit beer intake -BMET 1 week  Tommy Santiago  Your sodium level is somewhat lower.  Your kidney function (creatinine) is stable.  I think we should cut back on your hydrochlorothiazide to 12.5 mg daily (1/2 tab).  Also, limit water intake to 60 ounces or less per day and limit beer as we discussed yesterday.  I will recheck your basic metabolic panel again in 1 week. Tereso Newcomer, PA-C    03/19/2023 8:45 AM

## 2023-03-19 NOTE — Telephone Encounter (Signed)
The patient has been notified of the result and verbalized understanding.  All questions (if any) were answered. Ethelda Chick, RN 03/19/2023 11:50 AM    Will place order for lab and update medication list.

## 2023-03-26 ENCOUNTER — Other Ambulatory Visit: Payer: Self-pay | Admitting: *Deleted

## 2023-03-26 ENCOUNTER — Ambulatory Visit (HOSPITAL_COMMUNITY): Payer: PPO | Attending: Internal Medicine

## 2023-03-26 DIAGNOSIS — I25119 Atherosclerotic heart disease of native coronary artery with unspecified angina pectoris: Secondary | ICD-10-CM | POA: Insufficient documentation

## 2023-03-26 DIAGNOSIS — R072 Precordial pain: Secondary | ICD-10-CM | POA: Insufficient documentation

## 2023-03-26 DIAGNOSIS — Z79899 Other long term (current) drug therapy: Secondary | ICD-10-CM

## 2023-03-26 DIAGNOSIS — R0602 Shortness of breath: Secondary | ICD-10-CM | POA: Insufficient documentation

## 2023-03-26 LAB — MYOCARDIAL PERFUSION IMAGING
LV dias vol: 72 mL (ref 62–150)
LV sys vol: 21 mL
Nuc Stress EF: 70 %
Peak HR: 86 {beats}/min
Rest HR: 71 {beats}/min
Rest Nuclear Isotope Dose: 11 mCi
SDS: 2
SRS: 1
SSS: 3
ST Depression (mm): 0 mm
Stress Nuclear Isotope Dose: 32.8 mCi
TID: 1

## 2023-03-26 MED ORDER — REGADENOSON 0.4 MG/5ML IV SOLN
0.4000 mg | Freq: Once | INTRAVENOUS | Status: AC
  Administered 2023-03-26: 0.4 mg via INTRAVENOUS

## 2023-03-26 MED ORDER — TECHNETIUM TC 99M TETROFOSMIN IV KIT
30.0000 | PACK | Freq: Once | INTRAVENOUS | Status: AC | PRN
Start: 2023-03-26 — End: 2023-03-26
  Administered 2023-03-26: 30 via INTRAVENOUS

## 2023-03-26 MED ORDER — TECHNETIUM TC 99M TETROFOSMIN IV KIT
11.0000 | PACK | Freq: Once | INTRAVENOUS | Status: AC | PRN
  Administered 2023-03-26: 11 via INTRAVENOUS

## 2023-03-27 LAB — BASIC METABOLIC PANEL
BUN/Creatinine Ratio: 14 (ref 10–24)
BUN: 16 mg/dL (ref 8–27)
CO2: 22 mmol/L (ref 20–29)
Calcium: 9.8 mg/dL (ref 8.6–10.2)
Chloride: 91 mmol/L — ABNORMAL LOW (ref 96–106)
Creatinine, Ser: 1.14 mg/dL (ref 0.76–1.27)
Glucose: 112 mg/dL — ABNORMAL HIGH (ref 70–99)
Potassium: 4.6 mmol/L (ref 3.5–5.2)
Sodium: 131 mmol/L — ABNORMAL LOW (ref 134–144)
eGFR: 67 mL/min/{1.73_m2} (ref >59)

## 2023-03-27 NOTE — Progress Notes (Signed)
 Pt has been made aware of normal result and verbalized understanding.  jw

## 2023-04-02 ENCOUNTER — Ambulatory Visit (HOSPITAL_COMMUNITY): Payer: PPO | Attending: Internal Medicine

## 2023-04-02 DIAGNOSIS — I25119 Atherosclerotic heart disease of native coronary artery with unspecified angina pectoris: Secondary | ICD-10-CM | POA: Diagnosis present

## 2023-04-02 DIAGNOSIS — R072 Precordial pain: Secondary | ICD-10-CM | POA: Diagnosis not present

## 2023-04-02 DIAGNOSIS — R0602 Shortness of breath: Secondary | ICD-10-CM | POA: Insufficient documentation

## 2023-04-02 LAB — ECHOCARDIOGRAM COMPLETE
AR max vel: 4.23 cm2
AV Area VTI: 3.95 cm2
AV Area mean vel: 3.4 cm2
AV Mean grad: 5 mm[Hg]
AV Peak grad: 7.3 mm[Hg]
Ao pk vel: 1.35 m/s
Area-P 1/2: 2.95 cm2
Calc EF: 64.7 %
S' Lateral: 2.27 cm
Single Plane A2C EF: 65.4 %
Single Plane A4C EF: 65.3 %

## 2023-04-06 ENCOUNTER — Other Ambulatory Visit (HOSPITAL_COMMUNITY): Payer: PPO

## 2023-04-08 ENCOUNTER — Encounter: Payer: Self-pay | Admitting: Physician Assistant

## 2023-04-08 ENCOUNTER — Ambulatory Visit: Payer: PPO | Attending: Physician Assistant | Admitting: Physician Assistant

## 2023-04-08 VITALS — BP 130/60 | HR 51 | Ht 71.0 in | Wt 230.8 lb

## 2023-04-08 DIAGNOSIS — I25119 Atherosclerotic heart disease of native coronary artery with unspecified angina pectoris: Secondary | ICD-10-CM

## 2023-04-08 DIAGNOSIS — I6529 Occlusion and stenosis of unspecified carotid artery: Secondary | ICD-10-CM

## 2023-04-08 DIAGNOSIS — E7800 Pure hypercholesterolemia, unspecified: Secondary | ICD-10-CM | POA: Diagnosis not present

## 2023-04-08 DIAGNOSIS — I10 Essential (primary) hypertension: Secondary | ICD-10-CM | POA: Diagnosis not present

## 2023-04-08 DIAGNOSIS — I471 Supraventricular tachycardia, unspecified: Secondary | ICD-10-CM

## 2023-04-08 DIAGNOSIS — I779 Disorder of arteries and arterioles, unspecified: Secondary | ICD-10-CM | POA: Insufficient documentation

## 2023-04-08 NOTE — Assessment & Plan Note (Signed)
He is followed by vascular surgery.

## 2023-04-08 NOTE — Assessment & Plan Note (Addendum)
Hx of DES to LAD in 2010. Cath in 2023 with jailed Dx and patent LAD stent.  He was recently seen with symptoms of chest discomfort and shortness of breath.  I adjusted his metoprolol to tartrate.  Nuclear stress test was obtained and felt to be low risk.  Echocardiogram demonstrated normal EF and no valvular heart disease.  He is currently doing well without recurrent symptoms of chest discomfort or shortness of breath.  No further CV testing is needed at this time. -Continue ASA 81 mg daily -Continue atorvastatin 20 mg daily -Continue metoprolol tartrate 50 mg twice daily -Continue NTG as needed -Follow-up 1 year

## 2023-04-08 NOTE — Patient Instructions (Signed)
Medication Instructions:  The current medical regimen is effective;  continue present plan and medications.  *If you need a refill on your cardiac medications before your next appointment, please call your pharmacy*  Follow-Up: At [facility], you and your health needs are our priority.  As part of our continuing mission to provide you with exceptional heart care, we have created designated Provider Care Teams.  These Care Teams include your primary Cardiologist (physician) and Advanced Practice Providers (APPs -  Physician Assistants and Nurse Practitioners) who all work together to provide you with the care you need, when you need it.  We recommend signing up for the patient portal called "MyChart".  Sign up information is provided on this After Visit Summary.  MyChart is used to connect with patients for Virtual Visits (Telemedicine).  Patients are able to view lab/test results, encounter notes, upcoming appointments, etc.  Non-urgent messages can be sent to your provider as well.   To learn more about what you can do with MyChart, go to [email or web address].    Your next appointment:   1 year(s)  Provider:   Verne Carrow, MD

## 2023-04-08 NOTE — Assessment & Plan Note (Signed)
Blood pressure controlled.  Blood pressures at home have been similar to his readings here in the office. -Continue HCTZ 12.5 mg daily -Continue metoprolol tartrate 50 mg twice daily -Continue olmesartan 40 mg daily

## 2023-04-08 NOTE — Assessment & Plan Note (Signed)
LDL optimal.  Continue Lipitor 20 mg daily.

## 2023-04-08 NOTE — Progress Notes (Signed)
Cardiology Office Note:    Date:  04/08/2023  ID:  Tommy Santiago, DOB 28-Aug-1945, MRN [id number] PCP: [name], MD  [facility] Providers Cardiologist:  Verne Carrow, MD       Patient Profile:      Coronary artery disease  S/p 2.75 x 18 mm DES to mLAD in 2010 TTE 05/18/20:EF 60-65, Gr 2 DD LHC 08/06/2021: LAD mid stent patent, Dx jailed ostial 70; LCx mid 30; RCA proximal mid 20; EF 60 Myoview 03/26/2023: Apical infarct w nominal peri-infarct ischemia, EF 70, low risk TTE 04/02/2023: EF 65-70, no RWMA, GR 2 DD, normal RVSF, normal PASP, AV sclerosis, RAP 3 Supraventricular Tachycardia  Rx w beta-blocker Monitor 04/2020: PACs 10.3%, PVCs < 1%, SVT runs (longest 2'46")  Carotid artery disease [place] 01/19/23: RICA 60-79, LICA 1-39  Followed by Vascular Surgery Hypertension  Hyperlipidemia  Ex smoker Bronchiectasis  Pulm: Dr. Craige Cotta Hepatocellular CA  Hep C  Hyponatremia        Tommy Santiago is a 79 y.o. male who returns for follow up of CAD. He was last seen 03/18/23 with symptoms of chest pain and shortness of breath. His metoprolol dose was increased to 50 mg twice daily. An echocardiogram was obtained and demonstrated normal EF, mod diastolic dysfunction, AV sclerosis. A nuclear stress test was also obtained and demonstrated an isolated apical infarct with nominal peri-infarct ischemia. The study was overall low risk. I reviewed his study with Dr. [name]. Med Rx can be continued if he is asymptomatic. If symptomatic, he would need to undergo a cardiac catheterization.    Discussed the use of AI scribe software for clinical note transcription with the patient, who gave verbal consent to proceed.  History of Present Illness   He is here alone. We discussed the results of his stress test and echocardiogram. Since last seen, he has had no symptoms of chest discomfort, shortness of breath, tightness, dizziness, lightheadedness, or swelling in the legs. Previous  symptoms have resolved. No heart palpitations or rapid beats. Blood pressure at home has been running between 128 to 138 systolic and in the 70s diastolic, similar to office readings. He uses a monitor to check for abnormal rhythms, which have consistently shown sinus bradycardia.     ROS-See HPI    Studies Reviewed:         Risk Assessment/Calculations:           Physical Exam:   VS:  BP 130/60   Pulse (!) 51   Ht 5\' 11"  (1.803 m)   Wt 230 lb 12.8 oz (104.7 kg)   SpO2 97%   BMI 32.19 kg/m    Wt Readings from Last 3 Encounters:  04/08/23 230 lb 12.8 oz (104.7 kg)  03/18/23 230 lb 9.6 oz (104.6 kg)  01/19/23 225 lb 14.4 oz (102.5 kg)    Constitutional:      Appearance: Healthy appearance. Not in distress.  Neck:     Vascular: JVD normal.  Pulmonary:     Breath sounds: Normal breath sounds. No wheezing. No rales.  Cardiovascular:     Normal rate. Regular rhythm.     Murmurs: There is no murmur.  Edema:    Peripheral edema absent.  Abdominal:     Palpations: Abdomen is soft.      Assessment and Plan:   Assessment & Plan Coronary artery disease involving native coronary artery of native heart with angina pectoris (HCC) Hx of DES to LAD in 2010. Cath in  2023 with jailed Dx and patent LAD stent.  He was recently seen with symptoms of chest discomfort and shortness of breath.  I adjusted his metoprolol to tartrate.  Nuclear stress test was obtained and felt to be low risk.  Echocardiogram demonstrated normal EF and no valvular heart disease.  He is currently doing well without recurrent symptoms of chest discomfort or shortness of breath.  No further CV testing is needed at this time. -Continue ASA 81 mg daily -Continue atorvastatin 20 mg daily -Continue metoprolol tartrate 50 mg twice daily -Continue NTG as needed -Follow-up 1 year Essential hypertension Blood pressure controlled.  Blood pressures at home have been similar to his readings here in the office. -Continue HCTZ  12.5 mg daily -Continue metoprolol tartrate 50 mg twice daily -Continue olmesartan 40 mg daily Pure hypercholesterolemia LDL optimal. -Continue Lipitor 20 mg daily Stenosis of carotid artery, unspecified laterality He is followed by vascular surgery. SVT (supraventricular tachycardia) (HCC) Quiescent on beta-blocker therapy.      Dispo:  Return in about 1 year (around 04/07/2024) for Routine Follow Up, w/ Dr. [name].  Signed, Tereso Newcomer, PA-C

## 2023-05-12 ENCOUNTER — Other Ambulatory Visit (HOSPITAL_BASED_OUTPATIENT_CLINIC_OR_DEPARTMENT_OTHER): Payer: Self-pay

## 2023-05-13 ENCOUNTER — Other Ambulatory Visit: Payer: Self-pay

## 2023-05-13 ENCOUNTER — Other Ambulatory Visit (HOSPITAL_BASED_OUTPATIENT_CLINIC_OR_DEPARTMENT_OTHER): Payer: Self-pay

## 2023-05-13 MED ORDER — ATORVASTATIN CALCIUM 20 MG PO TABS
20.0000 mg | ORAL_TABLET | Freq: Every day | ORAL | 0 refills | Status: DC
  Filled 2023-05-13: qty 90, 90d supply, fill #0

## 2023-05-18 ENCOUNTER — Ambulatory Visit: Payer: PPO | Admitting: Physician Assistant

## 2023-06-25 ENCOUNTER — Telehealth: Payer: Self-pay | Admitting: Adult Health

## 2023-06-25 NOTE — Telephone Encounter (Signed)
 Pt rescheduled appointment due to scheduling conflict

## 2023-07-20 ENCOUNTER — Encounter (HOSPITAL_COMMUNITY): Payer: PPO

## 2023-07-20 ENCOUNTER — Ambulatory Visit: Payer: PPO

## 2023-07-22 ENCOUNTER — Other Ambulatory Visit (HOSPITAL_BASED_OUTPATIENT_CLINIC_OR_DEPARTMENT_OTHER): Payer: Self-pay

## 2023-07-22 ENCOUNTER — Other Ambulatory Visit: Payer: Self-pay

## 2023-07-22 MED ORDER — ALBUTEROL SULFATE (2.5 MG/3ML) 0.083% IN NEBU
INHALATION_SOLUTION | RESPIRATORY_TRACT | 11 refills | Status: DC
  Filled 2023-07-22: qty 180, 84d supply, fill #0

## 2023-07-22 MED ORDER — AMOXICILLIN-POT CLAVULANATE 875-125 MG PO TABS
ORAL_TABLET | ORAL | 1 refills | Status: DC
  Filled 2023-07-22: qty 14, 7d supply, fill #0

## 2023-07-22 MED ORDER — SODIUM CHLORIDE 3 % IN NEBU
INHALATION_SOLUTION | RESPIRATORY_TRACT | 11 refills | Status: DC
  Filled 2023-07-22: qty 120, 30d supply, fill #0

## 2023-07-22 MED ORDER — ALBUTEROL SULFATE (2.5 MG/3ML) 0.083% IN NEBU
INHALATION_SOLUTION | RESPIRATORY_TRACT | 11 refills | Status: DC
  Filled 2023-07-22: qty 180, 25d supply, fill #0
  Filled 2023-07-22: qty 180, 15d supply, fill #0
  Filled 2023-07-23: qty 225, 19d supply, fill #0

## 2023-07-22 MED ORDER — SODIUM CHLORIDE 3 % IN NEBU
INHALATION_SOLUTION | RESPIRATORY_TRACT | 11 refills | Status: DC
  Filled 2023-07-22: qty 120, 15d supply, fill #0
  Filled 2023-07-22: qty 120, 30d supply, fill #0
  Filled 2023-07-23: qty 750, 25d supply, fill #0

## 2023-07-23 ENCOUNTER — Other Ambulatory Visit (HOSPITAL_BASED_OUTPATIENT_CLINIC_OR_DEPARTMENT_OTHER): Payer: Self-pay

## 2023-07-23 ENCOUNTER — Other Ambulatory Visit: Payer: Self-pay

## 2023-07-24 ENCOUNTER — Other Ambulatory Visit: Payer: Self-pay

## 2023-07-24 ENCOUNTER — Other Ambulatory Visit (HOSPITAL_BASED_OUTPATIENT_CLINIC_OR_DEPARTMENT_OTHER): Payer: Self-pay

## 2023-07-27 ENCOUNTER — Ambulatory Visit (HOSPITAL_COMMUNITY)
Admission: RE | Admit: 2023-07-27 | Discharge: 2023-07-27 | Disposition: A | Discharge status: Home / Self Care | Source: Ambulatory Visit | Attending: Surgery | Admitting: Surgery

## 2023-07-27 ENCOUNTER — Ambulatory Visit: Attending: Surgery | Admitting: Physician Assistant

## 2023-07-27 ENCOUNTER — Other Ambulatory Visit (HOSPITAL_BASED_OUTPATIENT_CLINIC_OR_DEPARTMENT_OTHER): Payer: Self-pay

## 2023-07-27 VITALS — BP 146/77 | HR 45 | Temp 98.0°F | Ht 71.0 in | Wt 235.5 lb

## 2023-07-27 DIAGNOSIS — I6523 Occlusion and stenosis of bilateral carotid arteries: Secondary | ICD-10-CM

## 2023-07-27 DIAGNOSIS — R202 Paresthesia of skin: Secondary | ICD-10-CM | POA: Diagnosis not present

## 2023-07-27 DIAGNOSIS — I739 Peripheral vascular disease, unspecified: Secondary | ICD-10-CM

## 2023-07-27 LAB — VAS US ABI WITH/WO TBI
Left ABI: 0.85
Right ABI: 1.17

## 2023-07-27 NOTE — Progress Notes (Signed)
 HISTORY AND PHYSICAL     CC:  follow up. Requesting Provider:  Alfredia Ina, MD  HPI: This is a 78 y.o. male who is here today for follow up.   He was found to have carotid artery stenosis in June 2021 during work-up for syncopal episode.     Pt was last seen on 01/19/2023.  At that time, he was doing well.  He did not have any neurological sx.  He did have some tingling in his feet but no claudication, rest pain or non healing wounds.    The pt returns today for follow up.    Pt denies any amaurosis fugax, speech difficulties, weakness, numbness, paralysis or clumsiness or facial droop.    Pt denies claudication, rest pain, or non healing wounds.  He does get some cramping in his thighs with walking sometimes.    He continues to get scans at [facility] for liver tumor and they are able to follow his abdominal aorta as well.    Fortunately, he has not smoked since 1986.  The pt is on a statin for cholesterol management.    The pt is on an aspirin .    Other AC:  none The pt is on BB, diuretic for hypertension.  The pt is not on medication for diabetes. Tobacco hx:  former  Pt does  have family hx of AAA.  He does have two brothers who have aneurysms, one is thoracic and one is aortic. He gets regular scans at [facility] and he has not had any evidence of aneurysm.   Past Medical History:  Diagnosis Date   Bronchiectasis (HCC)    Bronchiectasis (HCC)    CAP (community acquired pneumonia)    Carotid artery occlusion    Coronary atherosclerosis of unspecified type of vessel, native or graft    status post cardiac cath w percutaneous coronary intervention using XIENCE drug-eluting stent to mid left anterior descending 07/23/08; LHC 08/07/11: Mid LAD stent patent, moderate size diagonal branch jailed by stent with 70% ostial stenosis, unchanged from 2010 with excellent flow down the diagonal branch, mid circumflex 30%, proximal and mid RCA 20%, EF 60%.  Medical therapy continued.     GERD (gastroesophageal reflux disease)    Hepatitis C    Treated Harmony   Hyperlipemia    Hypertension    Hypopotassemia    Lichen planus    Other and unspecified hyperlipidemia    Other specified cardiac dysrhythmias(427.89)    Paroxysmal supraventricular tachycardia (HCC)    Personal history of other infectious and parasitic disease    Plantar fascial fibromatosis    Pneumonia    Syncope and collapse     Past Surgical History:  Procedure Laterality Date   CATARACT EXTRACTION     CHOLECYSTECTOMY     COLONOSCOPY     colonscopy     status post   CORONARY ANGIOPLASTY WITH STENT PLACEMENT     ESOPHAGOGASTRODUODENOSCOPY     status post   INSERTION OF MESH N/A 01/30/2017   Procedure: INSERTION OF MESH;  Surgeon: Dorena Gander, MD;  Location:  SURGERY CENTER;  Service: General;  Laterality: N/A;   KNEE ARTHROSCOPY     LEFT HEART CATHETERIZATION WITH CORONARY ANGIOGRAM  08/07/2011   Procedure: LEFT HEART CATHETERIZATION WITH CORONARY ANGIOGRAM;  Surgeon: Odie Benne, MD;  Location: [facility] CATH LAB;  Service: Cardiovascular;;   UMBILICAL HERNIA REPAIR N/A 01/30/2017   Procedure: REPAIR OF UMBILICAL HERNIA;  Surgeon: Dorena Gander, MD;  Location:  [facility];  Service: General;  Laterality: N/A;    Allergies  Allergen Reactions   Amlodipine Swelling and Other (See Comments)    Ankle swelling    Current Outpatient Medications  Medication Sig Dispense Refill   albuterol  (PROVENTIL ) (2.5 MG/3ML) 0.083% nebulizer solution Take 3 mLs (2.5 mg total) by nebulization every 6 (six) hours as needed for wheezing or shortness of breath. 360 mL 10   albuterol  (PROVENTIL ) (2.5 MG/3ML) 0.083% nebulizer solution Take 3 mL (2.5 mg total) by nebulization every 6 (six) hours as needed for wheezing or shortness of breath. 180 mL 11   amoxicillin -clavulanate (AUGMENTIN ) 875-125 MG tablet Take 1 tablet by mouth 2 (two) times a day for 7 days. 14 tablet 1   aspirin   EC 81 MG tablet Take 1 tablet (81 mg total) by mouth daily.     atorvastatin  (LIPITOR) 20 MG tablet Take 1 tablet (20 mg total) by mouth daily. 90 tablet 0   B Complex-C (SUPER B COMPLEX PO) Take 1 tablet by mouth daily.     Calcium  Carbonate-Vitamin D  (CALCIUM -D PO) Take 1-1.5 tablets by mouth See admin instructions. Take 1 tablet in the morning and 1.5 tablets at night     Cholecalciferol  (VITAMIN D ) 50 MCG (2000 UT) tablet Take 2,000 Units by mouth daily.     dextromethorphan-guaiFENesin (ROBITUSSIN-DM) 10-100 MG/5ML liquid Take 10 mLs by mouth every 6 (six) hours as needed for cough.     diphenhydrAMINE (BENADRYL) 25 MG tablet Take 25 mg by mouth daily as needed for allergies.     Docusate Sodium  (DSS) 100 MG CAPS Take 1 capsule by mouth 2 (two) times daily.     doxycycline  (VIBRA -TABS) 100 MG tablet Take 1 tablet (100 mg total) by mouth 2 (two) times daily. 14 tablet 0   famotidine  (PEPCID ) 20 MG tablet Take 1 tablet (20 mg total) by mouth daily. 90 tablet 3   Fiber POWD Take 1 Scoop by mouth daily as needed (constipation).     fluticasone  (CUTIVATE ) 0.005 % ointment Apply 1 Application topically 2 (two) times daily as needed for flare 60 g 5   hydrochlorothiazide  (HYDRODIURIL ) 25 MG tablet Take 0.5 tablets (12.5 mg total) by mouth daily. 45 tablet 3   ibuprofen  (ADVIL ) 200 MG tablet Take by mouth as needed for headache or moderate pain.     magnesium  oxide (MAG-OX) 400 MG tablet Take by mouth.     metoprolol  tartrate (LOPRESSOR ) 50 MG tablet Take 1 tablet (50 mg total) by mouth 2 (two) times daily. 180 tablet 3   MISC NATURAL PRODUCTS PO Take 7 mg by mouth daily. CBD Gummies     nitroGLYCERIN  (NITROSTAT ) 0.4 MG SL tablet Place 1 tablet (0.4 mg total) under the tongue every 5 (five) minutes as needed for chest pain. 25 tablet 6   olmesartan  (BENICAR ) 40 MG tablet Take 1 tablet (40 mg total) by mouth daily. 90 tablet 3   polyethylene glycol (MIRALAX  / GLYCOLAX ) 17 g packet Take by mouth as  needed for moderate constipation.     PSYLLIUM PO Take by mouth.     Respiratory Therapy Supplies (FLUTTER) DEVI Use as directed 1 each 0   Simethicone  125 MG CAPS Take 1 capsule by mouth as needed.     sodium chloride  HYPERTONIC 3 % nebulizer solution Take 4 mLs by nebulization as needed for chest congestion. 240 mL 5   sodium chloride  HYPERTONIC 3 % nebulizer solution Take 4 mL by nebulization 2 (two)  times a day as needed for shortness of breath, wheezing or cough. 120 mL 11   tetrahydrozoline-zinc (VISINE-AC) 0.05-0.25 % ophthalmic solution Apply to eye as needed (dry eyes).     vitamin E 180 MG (400 UNITS) capsule Take 400 Units by mouth daily.     No current facility-administered medications for this visit.    Family History  Problem Relation Age of Onset   Heart disease Father    Coronary artery disease Brother        diagnosed in early 3s   Rectal cancer Brother    Coronary artery disease Other        hx family   Sleep apnea Neg Hx     Social History   Socioeconomic History   Marital status: Married    Spouse name: Not on file   Number of children: Not on file   Years of education: Not on file   Highest education level: Not on file  Occupational History   Occupation: Critical care RN  Tobacco Use   Smoking status: Former    Current packs/day: 0.00    Average packs/day: 2.0 packs/day for 25.0 years (50.0 ttl pk-yrs)    Types: Cigarettes    Start date: 87    Quit date: 78    Years since quitting: 36.4    Passive exposure: Never   Smokeless tobacco: Never  Vaping Use   Vaping status: Never Used  Substance and Sexual Activity   Alcohol use: Not Currently    Alcohol/week: 12.0 standard drinks of alcohol    Types: 4 Cans of beer, 8 Shots of liquor per week    Comment: social   Drug use: No    Comment: quit 25 years ago   Sexual activity: Not on file  Other Topics Concern   Not on file  Social History Narrative   Not on file   Social Drivers of Health    Financial Resource Strain: Low Risk  (10/27/2022)   Received from [facility]   Overall Financial Resource Strain (CARDIA)    Difficulty of Paying Living Expenses: Not hard at all  Food Insecurity: Low Risk  (07/22/2023)   Received from [facility]   Hunger Vital Sign    Worried About Running Out of Food in the Last Year: Never true    Ran Out of Food in the Last Year: Never true  Transportation Needs: No Transportation Needs (07/22/2023)   Received from [facility]    In the past 12 months, has lack of reliable transportation kept you from medical appointments, meetings, work or from getting things needed for daily living? : No  Physical Activity: Sufficiently Active (10/27/2022)   Received from [facility]   Exercise Vital Sign    Days of Exercise per Week: 5 days    Minutes of Exercise per Session: 40 min  Stress: No Stress Concern Present (10/27/2022)   Received from [facility] - Occupational Stress Questionnaire    Feeling of Stress : Not at all  Social Connections: Somewhat Isolated (10/27/2022)   Received from [facility]   Social Network    How would you rate your social network (family, work, friends)?: Restricted participation with some degree of social isolation  Intimate Partner Violence: Not At Risk (10/27/2022)   Received from [facility]   HITS    Over the last 12 months how often did your partner physically hurt you?: Never  Over the last 12 months how often did your partner insult you or talk down to you?: Never    Over the last 12 months how often did your partner threaten you with physical harm?: Never    Over the last 12 months how often did your partner scream or curse at you?: Never     REVIEW OF SYSTEMS:   [X]  denotes positive finding, [ ]  denotes negative finding Cardiac  Comments:  Chest pain or chest pressure:    Shortness of breath upon exertion:    Short of breath when lying  flat:    Irregular heart rhythm:        Vascular    Pain in calf, thigh, or hip brought on by ambulation: x See HPI  Pain in feet at night that wakes you up from your sleep:     Blood clot in your veins:    Leg swelling:         Pulmonary    Oxygen at home:    Wheezing:         Neurologic    Sudden weakness in arms or legs:     Sudden numbness in arms or legs:     Sudden onset of difficulty speaking or understanding others    Temporary loss of vision in one eye:     Problems with dizziness:         Gastrointestinal    Blood in stool:     Vomited blood:         Genitourinary    Burning when urinating:     Blood in urine:        Psychiatric    Major depression:         Hematologic    Bleeding problems:    Problems with blood clotting too easily:        Skin    Rashes or ulcers:        Constitutional    Fever or chills:      PHYSICAL EXAMINATION:  Today's Vitals   07/27/23 1034 07/27/23 1036  BP: (!) 147/74 (!) 146/77  Pulse: (!) 45   Temp: 98 F (36.7 C)   TempSrc: Temporal   SpO2: 98%   Weight: 235 lb 8 oz (106.8 kg)   Height: 5\' 11"  (1.803 m)   PainSc: 0-No pain    Body mass index is 32.85 kg/m.   General:  WDWN in NAD; vital signs documented above Gait: Not observed HENT: WNL, normocephalic Pulmonary: normal non-labored breathing  Cardiac: regular HR;  without carotid bruits Abdomen: soft, NT, aortic pulse is not palpable Skin: without rashes Vascular Exam/Pulses:  Right Left  Radial 2+ (normal) 2+ (normal)  DP Brisk biphasic absent  PT 2+ (normal)/biphasic Brisk monophasic  Peroneal biphasic monophasic   Extremities: no ulcerations present Musculoskeletal: no muscle wasting or atrophy  Neurologic: A&O X 3;  speech is fluent/normal; moving all extremities equally  Psychiatric:  The pt has Normal affect.   Non-Invasive Vascular Imaging:   ABI's/TBI's on 07/27/2023: Right:  1.17/0.92 - great toe pressure:  122 Left:  0.85/0.60 - great  toe pressure:  79  Carotid Duplex on 07/27/2023: Right:  40-59% ICA stenosis Left:  1-39% ICA stenosis Vertebrals:  Bilateral vertebral arteries demonstrate antegrade flow.  Subclavians: Normal flow hemodynamics were seen in bilateral subclavian arteries   Previous Carotid duplex on 01/19/2023: Right: 60-79% ICA stenosis Left:   1-39% ICA stenosis    ASSESSMENT/PLAN:: 78 y.o. male here for  follow up for PAD and carotid stenosis and  found to have carotid artery stenosis in June 2021 during work-up for syncopal episode.   He did have some tingling in his feet at last visit so ABI was obtained for baseline today.    PAD -ABI as above.  He does get occasional cramping in his hips with walking.  He does not have rest pain or non healing wounds.   -continue graduated walking program -he will continue to protect his feet-if he becomes symptomatic, will obtain arterial studies and ABI.   Carotid stenosis -duplex today reveals right ICA stenosis is 40-59% and left is 1-39%.  Looking back at his u/s over the years, he has bounced back and forth between the 40-59 and 60-79% category.  He remains asymptomatic.  -discussed s/s of stroke with pt and he understands should he develop any of these sx, he will go to the nearest ER or call 911. -pt will f/u in 6 months with carotid duplex.  -continue statin/asa    Maryanna Smart, [facility] [phone]  Clinic MD:   Susi Eric on call MD

## 2023-07-28 ENCOUNTER — Other Ambulatory Visit: Payer: Self-pay | Admitting: *Deleted

## 2023-07-28 DIAGNOSIS — I6523 Occlusion and stenosis of bilateral carotid arteries: Secondary | ICD-10-CM

## 2023-08-05 ENCOUNTER — Other Ambulatory Visit (HOSPITAL_BASED_OUTPATIENT_CLINIC_OR_DEPARTMENT_OTHER): Payer: Self-pay

## 2023-08-05 ENCOUNTER — Other Ambulatory Visit: Payer: Self-pay | Admitting: Cardiovascular Disease

## 2023-08-06 ENCOUNTER — Other Ambulatory Visit (HOSPITAL_BASED_OUTPATIENT_CLINIC_OR_DEPARTMENT_OTHER): Payer: Self-pay

## 2023-08-06 ENCOUNTER — Other Ambulatory Visit: Payer: Self-pay

## 2023-08-06 MED ORDER — ATORVASTATIN CALCIUM 20 MG PO TABS
20.0000 mg | ORAL_TABLET | Freq: Every day | ORAL | 0 refills | Status: DC
  Filled 2023-08-06: qty 90, 90d supply, fill #0

## 2023-08-07 ENCOUNTER — Other Ambulatory Visit (HOSPITAL_BASED_OUTPATIENT_CLINIC_OR_DEPARTMENT_OTHER): Payer: Self-pay

## 2023-08-07 ENCOUNTER — Other Ambulatory Visit: Payer: Self-pay

## 2023-08-07 MED ORDER — OLMESARTAN MEDOXOMIL 40 MG PO TABS
40.0000 mg | ORAL_TABLET | Freq: Every day | ORAL | 2 refills | Status: DC
  Filled 2023-08-07: qty 90, 90d supply, fill #0
  Filled 2023-11-07: qty 90, 90d supply, fill #1

## 2023-09-15 ENCOUNTER — Telehealth: Payer: PPO | Admitting: Adult Health

## 2023-09-29 ENCOUNTER — Telehealth: Payer: PPO | Admitting: Adult Health

## 2023-11-07 ENCOUNTER — Other Ambulatory Visit (HOSPITAL_BASED_OUTPATIENT_CLINIC_OR_DEPARTMENT_OTHER): Payer: Self-pay

## 2023-11-09 ENCOUNTER — Other Ambulatory Visit (HOSPITAL_BASED_OUTPATIENT_CLINIC_OR_DEPARTMENT_OTHER): Payer: Self-pay

## 2023-11-09 ENCOUNTER — Other Ambulatory Visit: Payer: Self-pay

## 2023-11-09 MED ORDER — ATORVASTATIN CALCIUM 20 MG PO TABS
20.0000 mg | ORAL_TABLET | Freq: Every day | ORAL | 0 refills | Status: DC
  Filled 2023-11-09: qty 90, 90d supply, fill #0

## 2023-11-10 ENCOUNTER — Other Ambulatory Visit (HOSPITAL_COMMUNITY): Payer: Self-pay

## 2023-11-10 ENCOUNTER — Other Ambulatory Visit: Payer: Self-pay

## 2023-11-11 ENCOUNTER — Encounter: Payer: Self-pay | Admitting: *Deleted

## 2023-11-12 ENCOUNTER — Telehealth: Admitting: Adult Health

## 2023-11-12 DIAGNOSIS — G4733 Obstructive sleep apnea (adult) (pediatric): Secondary | ICD-10-CM

## 2023-11-12 NOTE — Patient Instructions (Signed)
 Continue using CPAP nightly and greater than 4 hours each night If your symptoms worsen or you develop new symptoms please let us  know.

## 2023-11-12 NOTE — Progress Notes (Signed)
 PATIENT: Tommy Santiago DOB: 01-01-1946  REASON FOR VISIT: follow up HISTORY FROM: patient PRIMARY NEUROLOGIST: Dr. Chalice  Virtual Visit via Video Note  I connected with Tommy Santiago on 11/12/23 at  2:45 PM EDT by a video enabled telemedicine application located remotely at [facility] and verified that I am speaking with the correct person using two identifiers who was located at their own home in [place]    I discussed the limitations of evaluation and management by telemedicine and the availability of in person appointments. The patient expressed understanding and agreed to proceed.   PATIENT: Tommy Santiago DOB: 24-Jun-1945  REASON FOR VISIT: follow up HISTORY FROM: patient  HISTORY OF PRESENT ILLNESS: Today 11/12/23  Tommy Santiago is a 78 y.o. male with a history of obstructive sleep apnea on CPAP. Returns today for follow-up.  He reports that the CPAP is working well for him.  He denies any new issues.  He uses a nasal mask.  Download is below     REVIEW OF SYSTEMS: Out of a complete 14 system review of symptoms, the patient complains only of the following symptoms, and all other reviewed systems are negative.  ALLERGIES: Allergies  Allergen Reactions   Amlodipine Swelling and Other (See Comments)    Ankle swelling    HOME MEDICATIONS: Outpatient Medications Prior to Visit  Medication Sig Dispense Refill   albuterol  (PROVENTIL ) (2.5 MG/3ML) 0.083% nebulizer solution Take 3 mLs (2.5 mg total) by nebulization every 6 (six) hours as needed for wheezing or shortness of breath. 360 mL 10   albuterol  (PROVENTIL ) (2.5 MG/3ML) 0.083% nebulizer solution Take 3 mL (2.5 mg total) by nebulization every 6 (six) hours as needed for wheezing or shortness of breath. 180 mL 11   amoxicillin -clavulanate (AUGMENTIN ) 875-125 MG tablet Take 1 tablet by mouth 2 (two) times a day for 7 days. 14 tablet 1   aspirin  EC 81 MG tablet Take 1 tablet (81 mg  total) by mouth daily.     atorvastatin  (LIPITOR) 20 MG tablet Take 1 tablet (20 mg total) by mouth daily. 90 tablet 0   B Complex-C (SUPER B COMPLEX PO) Take 1 tablet by mouth daily.     Calcium  Carbonate-Vitamin D  (CALCIUM -D PO) Take 1-1.5 tablets by mouth See admin instructions. Take 1 tablet in the morning and 1.5 tablets at night     Cholecalciferol  (VITAMIN D ) 50 MCG (2000 UT) tablet Take 2,000 Units by mouth daily.     dextromethorphan-guaiFENesin (ROBITUSSIN-DM) 10-100 MG/5ML liquid Take 10 mLs by mouth every 6 (six) hours as needed for cough.     diphenhydrAMINE (BENADRYL) 25 MG tablet Take 25 mg by mouth daily as needed for allergies.     Docusate Sodium  (DSS) 100 MG CAPS Take 1 capsule by mouth 2 (two) times daily.     doxycycline  (VIBRA -TABS) 100 MG tablet Take 1 tablet (100 mg total) by mouth 2 (two) times daily. 14 tablet 0   famotidine  (PEPCID ) 20 MG tablet Take 1 tablet (20 mg total) by mouth daily. 90 tablet 3   Fiber POWD Take 1 Scoop by mouth daily as needed (constipation).     fluticasone  (CUTIVATE ) 0.005 % ointment Apply 1 Application topically 2 (two) times daily as needed for flare 60 g 5   hydrochlorothiazide  (HYDRODIURIL ) 25 MG tablet Take 0.5 tablets (12.5 mg total) by mouth daily. 45 tablet 3   ibuprofen  (ADVIL ) 200 MG tablet Take by mouth as needed for headache  or moderate pain.     magnesium  oxide (MAG-OX) 400 MG tablet Take by mouth.     metoprolol  tartrate (LOPRESSOR ) 50 MG tablet Take 1 tablet (50 mg total) by mouth 2 (two) times daily. 180 tablet 3   MISC NATURAL PRODUCTS PO Take 7 mg by mouth daily. CBD Gummies     nitroGLYCERIN  (NITROSTAT ) 0.4 MG SL tablet Place 1 tablet (0.4 mg total) under the tongue every 5 (five) minutes as needed for chest pain. 25 tablet 6   olmesartan  (BENICAR ) 40 MG tablet Take 1 tablet (40 mg total) by mouth daily. 90 tablet 2   polyethylene glycol (MIRALAX  / GLYCOLAX ) 17 g packet Take by mouth as needed for moderate constipation.      PSYLLIUM PO Take by mouth.     Respiratory Therapy Supplies (FLUTTER) DEVI Use as directed 1 each 0   Simethicone  125 MG CAPS Take 1 capsule by mouth as needed.     sodium chloride  HYPERTONIC 3 % nebulizer solution Take 4 mLs by nebulization as needed for chest congestion. 240 mL 5   sodium chloride  HYPERTONIC 3 % nebulizer solution Take 4 mL by nebulization 2 (two) times a day as needed for shortness of breath, wheezing or cough. 120 mL 11   tetrahydrozoline-zinc (VISINE-AC) 0.05-0.25 % ophthalmic solution Apply to eye as needed (dry eyes).     vitamin E 180 MG (400 UNITS) capsule Take 400 Units by mouth daily.     No facility-administered medications prior to visit.    PAST MEDICAL HISTORY: Past Medical History:  Diagnosis Date   Bronchiectasis (HCC)    Bronchiectasis (HCC)    CAP (community acquired pneumonia)    Carotid artery occlusion    Coronary atherosclerosis of unspecified type of vessel, native or graft    status post cardiac cath w percutaneous coronary intervention using XIENCE drug-eluting stent to mid left anterior descending 07/23/08; LHC 08/07/11: Mid LAD stent patent, moderate size diagonal branch jailed by stent with 70% ostial stenosis, unchanged from 2010 with excellent flow down the diagonal branch, mid circumflex 30%, proximal and mid RCA 20%, EF 60%.  Medical therapy continued.    GERD (gastroesophageal reflux disease)    Hepatitis C    Treated Harmony   Hyperlipemia    Hypertension    Hypopotassemia    Lichen planus    Other and unspecified hyperlipidemia    Other specified cardiac dysrhythmias(427.89)    Paroxysmal supraventricular tachycardia (HCC)    Personal history of other infectious and parasitic disease    Plantar fascial fibromatosis    Pneumonia    Syncope and collapse     PAST SURGICAL HISTORY: Past Surgical History:  Procedure Laterality Date   CATARACT EXTRACTION     CHOLECYSTECTOMY     COLONOSCOPY     colonscopy     status post    CORONARY ANGIOPLASTY WITH STENT PLACEMENT     ESOPHAGOGASTRODUODENOSCOPY     status post   INSERTION OF MESH N/A 01/30/2017   Procedure: INSERTION OF MESH;  Surgeon: Sebastian Moles, MD;  Location: [facility];  Service: General;  Laterality: N/A;   KNEE ARTHROSCOPY     LEFT HEART CATHETERIZATION WITH CORONARY ANGIOGRAM  08/07/2011   Procedure: LEFT HEART CATHETERIZATION WITH CORONARY ANGIOGRAM;  Surgeon: Lonni JONETTA Cash, MD;  Location: [facility] CATH LAB;  Service: Cardiovascular;;   UMBILICAL HERNIA REPAIR N/A 01/30/2017   Procedure: REPAIR OF UMBILICAL HERNIA;  Surgeon: Sebastian Moles, MD;  Location:  SURGERY CENTER;  Service: General;  Laterality: N/A;    FAMILY HISTORY: Family History  Problem Relation Age of Onset   Heart disease Father    Coronary artery disease Brother        diagnosed in early 40s   Rectal cancer Brother    Coronary artery disease Other        hx family   Sleep apnea Neg Hx     SOCIAL HISTORY: Social History   Socioeconomic History   Marital status: Married    Spouse name: Not on file   Number of children: Not on file   Years of education: Not on file   Highest education level: Not on file  Occupational History   Occupation: Critical care RN  Tobacco Use   Smoking status: Former    Current packs/day: 0.00    Average packs/day: 2.0 packs/day for 25.0 years (50.0 ttl pk-yrs)    Types: Cigarettes    Start date: 60    Quit date: 1989    Years since quitting: 36.7    Passive exposure: Never   Smokeless tobacco: Never  Vaping Use   Vaping status: Never Used  Substance and Sexual Activity   Alcohol use: Not Currently    Alcohol/week: 12.0 standard drinks of alcohol    Types: 4 Cans of beer, 8 Shots of liquor per week    Comment: social   Drug use: No    Comment: quit 25 years ago   Sexual activity: Not on file  Other Topics Concern   Not on file  Social History Narrative   Not on file   Social Drivers of Health    Financial Resource Strain: Low Risk  (10/30/2023)   Received from [facility]   Overall Financial Resource Strain (CARDIA)    How hard is it for you to pay for the very basics like food, housing, medical care, and heating?: Not hard at all  Food Insecurity: No Food Insecurity (10/30/2023)   Received from [facility]   Hunger Vital Sign    Within the past 12 months, you worried that your food would run out before you got the money to buy more.: Never true    Within the past 12 months, the food you bought just didn't last and you didn't have money to get more.: Never true  Transportation Needs: No Transportation Needs (10/30/2023)   Received from [facility] - Transportation    In the past 12 months, has lack of transportation kept you from medical appointments or from getting medications?: No    In the past 12 months, has lack of transportation kept you from meetings, work, or from getting things needed for daily living?: No  Physical Activity: Insufficiently Active (10/30/2023)   Received from [facility]   Exercise Vital Sign    On average, how many days per week do you engage in moderate to strenuous exercise (like a brisk walk)?: 3 days    On average, how many minutes do you engage in exercise at this level?: 30 min  Stress: No Stress Concern Present (10/30/2023)   Received from [facility] - Occupational Stress Questionnaire    Do you feel stress - tense, restless, nervous, or anxious, or unable to sleep at night because your mind is troubled all the time - these days?: Only a little  Social Connections: Somewhat Isolated (10/30/2023)   Received from [facility]   Social Network    How  would you rate your social network (family, work, friends)?: Restricted participation with some degree of social isolation  Intimate Partner Violence: Not At Risk (10/30/2023)   Received from [facility]   HITS    Over the last 12 months how  often did your partner physically hurt you?: Never    Over the last 12 months how often did your partner insult you or talk down to you?: Never    Over the last 12 months how often did your partner threaten you with physical harm?: Never    Over the last 12 months how often did your partner scream or curse at you?: Never      PHYSICAL EXAM Generalized: Well developed, in no acute distress   Neurological examination  Mentation: Alert oriented to time, place, history taking. Follows all commands speech and language fluent Cranial nerve II-XII: Facial symmetry noted.   DIAGNOSTIC DATA (LABS, IMAGING, TESTING) - I reviewed patient records, labs, notes, testing and imaging myself where available.  Lab Results  Component Value Date   WBC 6.4 02/14/2021   HGB 14.7 02/14/2021   HCT 43.4 02/14/2021   MCV 99.8 02/14/2021   PLT 241 02/14/2021      Component Value Date/Time   NA 131 (L) 03/26/2023 1139   K 4.6 03/26/2023 1139   CL 91 (L) 03/26/2023 1139   CO2 22 03/26/2023 1139   GLUCOSE 112 (H) 03/26/2023 1139   GLUCOSE 109 (H) 04/19/2020 1503   BUN 16 03/26/2023 1139   CREATININE 1.14 03/26/2023 1139   CALCIUM  9.8 03/26/2023 1139   PROT 6.3 (L) 04/19/2020 1503   PROT 6.5 04/19/2018 1018   ALBUMIN 4.0 04/19/2020 1503   ALBUMIN 4.4 04/19/2018 1018   AST 30 04/19/2020 1503   ALT 25 04/19/2020 1503   ALKPHOS 45 04/19/2020 1503   BILITOT 1.1 04/19/2020 1503   BILITOT 0.6 04/19/2018 1018   GFRNONAA >60 04/19/2020 1503   GFRAA >60 09/25/2019 1112   Lab Results  Component Value Date   CHOL 120 04/19/2018   HDL 42 04/19/2018   LDLCALC 63 04/19/2018   TRIG 73 04/19/2018   CHOLHDL 2.9 04/19/2018   No results found for: HGBA1C No results found for: VITAMINB12 Lab Results  Component Value Date   TSH 1.940 04/19/2020      ASSESSMENT AND PLAN 78 y.o. year old male  has a past medical history of Bronchiectasis (HCC), Bronchiectasis (HCC), CAP (community acquired  pneumonia), Carotid artery occlusion, Coronary atherosclerosis of unspecified type of vessel, native or graft, GERD (gastroesophageal reflux disease), Hepatitis C, Hyperlipemia, Hypertension, Hypopotassemia, Lichen planus, Other and unspecified hyperlipidemia, Other specified cardiac dysrhythmias(427.89), Paroxysmal supraventricular tachycardia (HCC), Personal history of other infectious and parasitic disease, Plantar fascial fibromatosis, Pneumonia, and Syncope and collapse. here with:  OSA on CPAP  CPAP compliance excellent Residual AHI is good Encouraged patient to continue using CPAP nightly and > 4 hours each night Patient states that he is moving to [place].  Will not schedule a follow-up at this time.  Tommy Russell, MSN, NP-C 11/12/2023, 2:45 PM [facility] [address] [place], [place] [address] [phone]

## 2023-11-19 ENCOUNTER — Other Ambulatory Visit: Payer: Self-pay

## 2023-12-16 NOTE — Progress Notes (Signed)
 SURGERY ONCOLOGY CLINICAL VISIT   Tommy Santiago  History of Present Illness The patient presents for evaluation of HCC. Laparoscopic-assisted microwave ablation of liver for [facility] in February 2023. Seen every 6 months. Last seen 05/2023, remained NED and asymptomatic. Continue with Q6M x 2 then transition to yearly.  He reports no difficulties in consuming food or beverages. He is not experiencing any pain under the right rib cage that radiates to the right shoulder.  Additionally, he has not observed any signs of jaundice, such as yellowing of the eyes or skin, nor has he noticed any changes in his urine or stool color.  He plans to relocate to [place] by 05/2024.  Current Medications[1]  Allergies[2]  Active Ambulatory Problems    Diagnosis Date Noted  . Hyperlipidemia 10/28/2016  . 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 History of adenomatous polyp of colon 10/28/2016  . Family history of colon cancer 10/28/2016  . 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 Bronchiectasis without complication (HCC) 10/28/2016  . Hypertension, benign 10/28/2016  . GERD (gastroesophageal reflux disease) 10/28/2016  . Obesity (BMI 30.0-34.9) 10/28/2016  . Hx of cataract surgery 09/28/2015  . Annual physical exam 09/20/2014  . CAP (community acquired pneumonia) 07/10/2016  . Closed compression fracture of second lumbar vertebra (HCC) 01/23/2015  . Coronary atherosclerosis 02/23/2012  . Impotence of organic origin 11/06/2011  . Nocturia 07/20/2012  . Osteopenia 11/06/2011  . Personal history of tobacco use, presenting hazards to health 07/20/2012  . Prediabetes 03/28/2016  . Screening for glaucoma 11/06/2011  . Umbilical hernia without obstruction and without gangrene 09/28/2015  . Paroxysmal supraventricular tachycardia 06/30/2008  . Lichen planus 06/30/2008  . Dysphagia 05/08/2016  . Screening for colon cancer 01/02/2017  . Hepatocellular carcinoma (HCC) 03/26/2021    Resolved Ambulatory Problems    Diagnosis Date Noted  . No Resolved Ambulatory Problems   Past Medical History:  Diagnosis Date  . Bronchiectasis   . CAD (coronary artery disease)   . Colon adenoma   . Hepatitis C   . Hypertension   . Pneumonia   . Postnecrotic cirrhosis    (CMD)   . SVT (supraventricular tachycardia) (CMD)       Review of Systems - Negative except if noted above  ECOG/Zubrod Performance Status: 1 - Strenous physical activity restricted; fully ambulatory and able to carry out light work KPS (80-70%)   Objective Blood pressure (!) 114/49, pulse 73, temperature 97.4 F (36.3 C), temperature source Temporal, height 1.803 m (5' 11), weight 111 kg (245 lb), SpO2 96%. Wt Readings from Last 3 Encounters:  12/16/23 111 kg (245 lb)  07/22/23 107 kg (235 lb 3.2 oz)  06/17/23 105 kg (232 lb)    HZW:Tzoo groomed, dressed appropriately, polite and interactive 78 y.o. male with no signs of obvious distress. Weight reviewed and discussed  Physical Exam -vitals reviewed  -Pulses RRR, no jugular distention -unlabored on room air without respiratory distress, equal bilateral expansion without wheezing or cough. No SOB with regular conversation -Abdomen soft non tender, no herniation -No BLE, ROM x 4 -interactive, appropriate thought process and mood   Results Imaging  - MRI: No evidence of new areas and the previously ablated area remains stable. IMPRESSION: 1.  Postablation changes of the liver without recurrent disease. 2.  No new liver lesions.  Tumor marker pattern over time has been the following:  Lab Results  Component Value Date   AFP 4.0 01/09/2022   AFP 5.0 06/18/2021  AFP 25.3 (H) 03/28/2021   AFP 22.8 (H) 01/09/2021   AFPTM 10.57 (A) 12/21/2019   AFPTM  12/21/2019    This notification relates to a result that was previously sent to your in-basket. Please search for GENETICS, AFP, SERUM, CANCER TEST results.      Assessment & Plan 1.  Malignant neoplasm of liver The MRI results indicate stability with no new areas of concern.  The previously ablated area remains stable, and there are no issues with the margin.   A repeat MRI is scheduled for late March or early April 2026.  2. Move to [place] Patient will be moving to [place]. We discussed a plan to assist with his medical transition.  The patient is advised to obtain a CD copy of all his scans for future reference, which will be beneficial if he relocates to [place] and needs to establish care there.  The CD will provide the new healthcare provider with a comprehensive history of his scans, including dates and treatments.  Continue to keep his Portal up to date so any tests, noted and reports can be printed or downloaded for his new Providers. This will ensure continuity of care and provide a good baseline for future monitoring especially moving to another country.      Orders Placed This Encounter  Procedures  . MRI Abdomen W And WO Contrast  . CBC without Differential  . Comprehensive Metabolic Panel  . Carcinoembryonic Antigen (CEA)  . Alpha-Fetoprotein (AFP), Tumor Marker    Return in about 5 months (around 05/23/2024) for following scans, and labs, To see Corean Albee, NP.   This note was completed using DAX. Patient provided verbal consent for use of DAX. I performed the exam, reviewed the diagnostics as indicated above. Electronically signed by:  Corean Arlean Albee, NP, 12/16/2023 3:53 PM        [1]  Current Outpatient Medications:  .  albuterol  2.5 mg /3 mL (0.083 %) nebulizer solution, Take 3 mL (2.5 mg total) by nebulization every 6 (six) hours as needed for wheezing or shortness of breath., Disp: 180 mL, Rfl: 11 .  alpha tocopherol (VITAMIN E) 268 mg (400 unit) capsule, Take 400 Units by mouth Once Daily., Disp: , Rfl:  .  aspirin  81 mg EC tablet, Take 81 mg by mouth Once Daily., Disp: , Rfl:  .  atorvastatin  (LIPITOR) 20 mg tablet, Take  20 mg by mouth every evening., Disp: , Rfl:  .  b complex vitamins cap capsule, Take 1 capsule by mouth Once Daily., Disp: , Rfl:  .  calcium  carbonate-vitamin D3 600 mg-5 mcg (200 unit) tab, Take 1 tablet by mouth Once Daily., Disp: , Rfl:  .  dextromethorphan-guaiFENesin (ROBITUSSIN-DM) 10-100 mg/5 mL syrup, Take 5 mL by mouth 4 (four) times a week., Disp: , Rfl:  .  diphenhydrAMINE (BENADRYL) 25 mg capsule, Take 25 mg by mouth as needed for allergies., Disp: , Rfl:  .  docusate sodium  (COLACE) 100 mg capsule, Take 100 mg by mouth 2 (two) times a day., Disp: , Rfl: 0 .  famotidine  (PEPCID ) 20 mg tablet, Take 1 tablet (20 mg total) by mouth daily., Disp: 90 tablet, Rfl: 3 .  fluticasone  propionate 0.05 % crea, Apply 1 Application topically as needed., Disp: , Rfl:  .  hydroCHLOROthiazide  (HYDRODIURIL ) 25 mg tablet, Take 12.5 mg by mouth daily., Disp: , Rfl:  .  ibuprofen  (MOTRIN ) 200 mg tablet, Take 600 mg by mouth as needed., Disp: , Rfl:  .  metoprolol  tartrate (LOPRESSOR ) 25 mg tablet, Take 1 tablet by mouth 2 (two) times a day., Disp: , Rfl:  .  nitroglycerin  (NITROSTAT ) 0.4 mg SL tablet, Place 0.4 mg under the tongue every 5 (five) minutes as needed for chest pain., Disp: , Rfl:  .  olmesartan  (BENICAR ) 20 mg tablet, Take 20 mg by mouth nightly., Disp: , Rfl:  .  polyethylene glycol (GLYCOLAX ) 17 gram packet, Take 17 g by mouth daily as needed., Disp: , Rfl:  .  psyllium (Natural Vegetable) powd, Take 1 packet by mouth Once Daily., Disp: , Rfl:  .  simethicone  125 mg cap, Take 1 capsule by mouth as needed., Disp: , Rfl:  .  sodium chloride  3 % nebulizer solution, Take 4 mL by nebulization 2 (two) times a day as needed for shortness of breath, wheezing or cough., Disp: 120 mL, Rfl: 11 .  tadalafiL  (Cialis ) 20 mg tablet, Take 20 mg by mouth as needed., Disp: , Rfl:  .  tetrahydrozoline-zinc (Eye Drops Irritation Relief) 0.05-0.25 % drop, Administer 1 drop into each eyes as needed., Disp: , Rfl:   No current facility-administered medications for this visit. [2] Allergies Allergen Reactions  . Amlodipine Swelling    Ankle swelling  . Doxycycline  Rash

## 2024-01-07 ENCOUNTER — Emergency Department (HOSPITAL_COMMUNITY)

## 2024-01-07 ENCOUNTER — Other Ambulatory Visit: Payer: Self-pay

## 2024-01-07 ENCOUNTER — Encounter (HOSPITAL_COMMUNITY): Payer: Self-pay | Admitting: Emergency Medicine

## 2024-01-07 DIAGNOSIS — I4891 Unspecified atrial fibrillation: Secondary | ICD-10-CM | POA: Diagnosis present

## 2024-01-07 DIAGNOSIS — J942 Hemothorax: Secondary | ICD-10-CM | POA: Diagnosis present

## 2024-01-07 DIAGNOSIS — E785 Hyperlipidemia, unspecified: Secondary | ICD-10-CM | POA: Diagnosis present

## 2024-01-07 DIAGNOSIS — E8721 Acute metabolic acidosis: Secondary | ICD-10-CM | POA: Diagnosis not present

## 2024-01-07 DIAGNOSIS — I4510 Unspecified right bundle-branch block: Secondary | ICD-10-CM | POA: Diagnosis present

## 2024-01-07 DIAGNOSIS — J9601 Acute respiratory failure with hypoxia: Secondary | ICD-10-CM | POA: Diagnosis present

## 2024-01-07 DIAGNOSIS — Z955 Presence of coronary angioplasty implant and graft: Secondary | ICD-10-CM

## 2024-01-07 DIAGNOSIS — I2510 Atherosclerotic heart disease of native coronary artery without angina pectoris: Secondary | ICD-10-CM | POA: Diagnosis present

## 2024-01-07 DIAGNOSIS — Z7982 Long term (current) use of aspirin: Secondary | ICD-10-CM

## 2024-01-07 DIAGNOSIS — E876 Hypokalemia: Secondary | ICD-10-CM

## 2024-01-07 DIAGNOSIS — I5032 Chronic diastolic (congestive) heart failure: Secondary | ICD-10-CM | POA: Diagnosis present

## 2024-01-07 DIAGNOSIS — K7460 Unspecified cirrhosis of liver: Secondary | ICD-10-CM | POA: Diagnosis present

## 2024-01-07 DIAGNOSIS — Z800 Family history of malignant neoplasm of digestive organs: Secondary | ICD-10-CM

## 2024-01-07 DIAGNOSIS — I469 Cardiac arrest, cause unspecified: Secondary | ICD-10-CM | POA: Diagnosis not present

## 2024-01-07 DIAGNOSIS — E872 Acidosis, unspecified: Secondary | ICD-10-CM | POA: Diagnosis present

## 2024-01-07 DIAGNOSIS — N179 Acute kidney failure, unspecified: Secondary | ICD-10-CM | POA: Diagnosis present

## 2024-01-07 DIAGNOSIS — I110 Hypertensive heart disease with heart failure: Secondary | ICD-10-CM | POA: Diagnosis present

## 2024-01-07 DIAGNOSIS — I4901 Ventricular fibrillation: Secondary | ICD-10-CM | POA: Diagnosis not present

## 2024-01-07 DIAGNOSIS — D72829 Elevated white blood cell count, unspecified: Secondary | ICD-10-CM

## 2024-01-07 DIAGNOSIS — R4020 Unspecified coma: Secondary | ICD-10-CM | POA: Diagnosis present

## 2024-01-07 DIAGNOSIS — I219 Acute myocardial infarction, unspecified: Secondary | ICD-10-CM | POA: Diagnosis present

## 2024-01-07 DIAGNOSIS — M96A3 Multiple fractures of ribs associated with chest compression and cardiopulmonary resuscitation: Secondary | ICD-10-CM | POA: Diagnosis present

## 2024-01-07 DIAGNOSIS — Z79899 Other long term (current) drug therapy: Secondary | ICD-10-CM

## 2024-01-07 DIAGNOSIS — Z8249 Family history of ischemic heart disease and other diseases of the circulatory system: Secondary | ICD-10-CM

## 2024-01-07 DIAGNOSIS — Z66 Do not resuscitate: Secondary | ICD-10-CM | POA: Diagnosis present

## 2024-01-07 DIAGNOSIS — Z888 Allergy status to other drugs, medicaments and biological substances status: Secondary | ICD-10-CM

## 2024-01-07 DIAGNOSIS — Z8619 Personal history of other infectious and parasitic diseases: Secondary | ICD-10-CM

## 2024-01-07 DIAGNOSIS — I5030 Unspecified diastolic (congestive) heart failure: Secondary | ICD-10-CM

## 2024-01-07 DIAGNOSIS — J690 Pneumonitis due to inhalation of food and vomit: Secondary | ICD-10-CM | POA: Diagnosis present

## 2024-01-07 DIAGNOSIS — I462 Cardiac arrest due to underlying cardiac condition: Secondary | ICD-10-CM | POA: Diagnosis present

## 2024-01-07 DIAGNOSIS — Z87891 Personal history of nicotine dependence: Secondary | ICD-10-CM

## 2024-01-07 DIAGNOSIS — Z515 Encounter for palliative care: Secondary | ICD-10-CM

## 2024-01-07 LAB — CBC
HCT: 47.6 % (ref 39.0–52.0)
Hemoglobin: 15.1 g/dL (ref 13.0–17.0)
MCH: 35.6 pg — ABNORMAL HIGH (ref 26.0–34.0)
MCHC: 31.7 g/dL (ref 30.0–36.0)
MCV: 112.3 fL — ABNORMAL HIGH (ref 80.0–100.0)
Platelets: 225 K/uL (ref 150–400)
RBC: 4.24 MIL/uL (ref 4.22–5.81)
RDW: 12.2 % (ref 11.5–15.5)
WBC: 15.5 K/uL — ABNORMAL HIGH (ref 4.0–10.5)
nRBC: 0.2 % (ref 0.0–0.2)

## 2024-01-07 LAB — PROTIME-INR
INR: 1.5 — ABNORMAL HIGH (ref 0.8–1.2)
Prothrombin Time: 19 s — ABNORMAL HIGH (ref 11.4–15.2)

## 2024-01-07 LAB — I-STAT CHEM 8, ED
BUN: 26 mg/dL — ABNORMAL HIGH (ref 8–23)
Calcium, Ion: 1.11 mmol/L — ABNORMAL LOW (ref 1.15–1.40)
Chloride: 103 mmol/L (ref 98–111)
Creatinine, Ser: 1.6 mg/dL — ABNORMAL HIGH (ref 0.61–1.24)
Glucose, Bld: 296 mg/dL — ABNORMAL HIGH (ref 70–99)
HCT: 47 % (ref 39.0–52.0)
Hemoglobin: 16 g/dL (ref 13.0–17.0)
Potassium: 3.4 mmol/L — ABNORMAL LOW (ref 3.5–5.1)
Sodium: 136 mmol/L (ref 135–145)
TCO2: 16 mmol/L — ABNORMAL LOW (ref 22–32)

## 2024-01-07 LAB — I-STAT ARTERIAL BLOOD GAS, ED
Acid-base deficit: 17 mmol/L — ABNORMAL HIGH (ref 0.0–2.0)
Bicarbonate: 13.8 mmol/L — ABNORMAL LOW (ref 20.0–28.0)
Calcium, Ion: 1.17 mmol/L (ref 1.15–1.40)
HCT: 42 % (ref 39.0–52.0)
Hemoglobin: 14.3 g/dL (ref 13.0–17.0)
O2 Saturation: 76 %
Patient temperature: 94.6
Potassium: 4.4 mmol/L (ref 3.5–5.1)
Sodium: 134 mmol/L — ABNORMAL LOW (ref 135–145)
TCO2: 15 mmol/L — ABNORMAL LOW (ref 22–32)
pCO2 arterial: 47.9 mmHg (ref 32–48)
pH, Arterial: 7.052 — CL (ref 7.35–7.45)
pO2, Arterial: 52 mmHg — ABNORMAL LOW (ref 83–108)

## 2024-01-07 LAB — LIPASE, BLOOD: Lipase: 29 U/L (ref 11–51)

## 2024-01-07 LAB — I-STAT CG4 LACTIC ACID, ED: Lactic Acid, Venous: 10.9 mmol/L (ref 0.5–1.9)

## 2024-01-07 LAB — ETHANOL: Alcohol, Ethyl (B): <15 mg/dL (ref <15)

## 2024-01-07 MED ORDER — FENTANYL BOLUS VIA INFUSION
25.0000 ug | INTRAVENOUS | Status: DC | PRN
  Administered 2024-01-07: 25 ug via INTRAVENOUS
  Administered 2024-01-08: 150 ug via INTRAVENOUS
  Administered 2024-01-08: 25 ug via INTRAVENOUS
  Administered 2024-01-08 (×2): 100 ug via INTRAVENOUS
  Administered 2024-01-08 (×2): 25 ug via INTRAVENOUS
  Administered 2024-01-08: 100 ug via INTRAVENOUS

## 2024-01-07 MED ORDER — PIPERACILLIN-TAZOBACTAM 3.375 G IVPB 30 MIN
3.3750 g | Freq: Once | INTRAVENOUS | Status: AC
  Administered 2024-01-07: 3.375 g via INTRAVENOUS
  Filled 2024-01-07: qty 50

## 2024-01-07 MED ORDER — VANCOMYCIN HCL 2000 MG/400ML IV SOLN
2000.0000 mg | Freq: Once | INTRAVENOUS | Status: AC
  Administered 2024-01-07: 2000 mg via INTRAVENOUS
  Filled 2024-01-07: qty 400

## 2024-01-07 MED ORDER — FENTANYL 2500MCG IN NS 250ML (10MCG/ML) PREMIX INFUSION
0.0000 ug/h | INTRAVENOUS | Status: DC
  Administered 2024-01-07: 25 ug/h via INTRAVENOUS
  Filled 2024-01-07 (×2): qty 250

## 2024-01-07 MED ORDER — PROPOFOL 1000 MG/100ML IV EMUL
0.0000 ug/kg/min | INTRAVENOUS | Status: DC
  Administered 2024-01-07: 5 ug/kg/min via INTRAVENOUS
  Filled 2024-01-07: qty 100

## 2024-01-07 NOTE — H&P (Signed)
 NAME:  Tommy Santiago, MRN:  [id number], DOB:  02/07/1946, LOS: 0 ADMISSION DATE:  01/07/2024, CONSULTATION DATE:  01/07/24 REFERRING MD:  EDP, CHIEF COMPLAINT:  post arrest   History of Present Illness:  78 yo male found unresponsive in his car by bystanders. EMS notified and pt was found in vfib, cpr started and 3 shocks given. Unknown downtime as he was found unresponsive but from time of ems arrival at 2147 ROSC achieved at 2210.    All history is obtained from chart as pt is intubated and unresponsive.   All labs and studies are pending. CCM called to admit as pt intubated.   Pertinent  Medical History  Bronchiectasis Cad s/p pci 2010 Gerd H/o hep c s/p treatment Hyperlipidemia Htn H/o syncope and collapse Svt HFpEF (grade 2)   Significant Hospital Events: Including procedures, antibiotic start and stop dates in addition to other pertinent events   Admitted to ICU after cardiac arrest 01/07/24  Interim History / Subjective:    Objective    Blood pressure (!) 149/72, pulse (!) 56, temperature (!) 96.3 F (35.7 C), temperature source Temporal, resp. rate (!) 23, height 5' 11 (1.803 m), weight 107 kg, SpO2 (!) 80%.    Vent Mode: PRVC FiO2 (%):  [100 %] 100 % Set Rate:  [20 bmp] 20 bmp Vt Set:  [620 mL] 620 mL PEEP:  [8 cmH20] 8 cmH20 Plateau Pressure:  [17 cmH20] 17 cmH20  No intake or output data in the 24 hours ending 01/07/24 2310 Filed Weights   01/07/24 2244  Weight: 107 kg    Examination: General: *** HENT: *** Lungs: *** Cardiovascular: *** Abdomen: *** Extremities: *** Neuro: *** GU: ***  Resolved problem list   Assessment and Plan  OOHCA, vfib arrest s/p rosc Leukocytosis Lactic acidosis Hypokalemia Metabolic acidosis HFpEF -titrate vent -vap protocol -monitor neuro status, if no improvement in next 72hours will reimage -consider eeg -maintain normothermia -check ctpa and cth for etiology of arrhythmia, r/o pe -check blood  cx -empiric abx with de-escalation as able -await labs and other studies/imaging -cont volume for lactate clearance -replace K, await mag -echo in am +/- cardiology consult   Labs   CBC: Recent Labs  Lab 01/07/24 2236 01/07/24 2308  WBC 15.5*  --   HGB 15.1  16.0 14.3  HCT 47.6  47.0 42.0  MCV 112.3*  --   PLT 225  --     Basic Metabolic Panel: Recent Labs  Lab 01/07/24 2236 01/07/24 2308  NA 136 134*  K 3.4* 4.4  CL 103  --   GLUCOSE 296*  --   BUN 26*  --   CREATININE 1.60*  --    GFR: Estimated Creatinine Clearance: 48.9 mL/min (A) (by C-G formula based on SCr of 1.6 mg/dL (H)). Recent Labs  Lab 01/07/24 2236 01/07/24 2237  WBC 15.5*  --   LATICACIDVEN  --  10.9*    Liver Function Tests: No results for input(s): AST, ALT, ALKPHOS, BILITOT, PROT, ALBUMIN in the last 168 hours. No results for input(s): LIPASE, AMYLASE in the last 168 hours. No results for input(s): AMMONIA in the last 168 hours.  ABG    Component Value Date/Time   PHART 7.052 (LL) 01/07/2024 2308   PCO2ART 47.9 01/07/2024 2308   PO2ART 52 (L) 01/07/2024 2308   HCO3 13.8 (L) 01/07/2024 2308   TCO2 15 (L) 01/07/2024 2308   ACIDBASEDEF 17.0 (H) 01/07/2024 2308   O2SAT 76 01/07/2024 2308  Coagulation Profile: Recent Labs  Lab 01/07/24 2231  INR 1.5*    Cardiac Enzymes: No results for input(s): CKTOTAL, CKMB, CKMBINDEX, TROPONINI in the last 168 hours.  HbA1C: No results found for: HGBA1C  CBG: No results for input(s): GLUCAP in the last 168 hours.  Review of Systems:   Unobtainable 2/2 intubated status  Past Medical History:  He,  has a past medical history of Bronchiectasis (HCC), Bronchiectasis (HCC), CAP (community acquired pneumonia), Carotid artery occlusion, Coronary atherosclerosis of unspecified type of vessel, native or graft, GERD (gastroesophageal reflux disease), Hepatitis C, Hyperlipemia, Hypertension, Hypopotassemia, Lichen  planus, Other and unspecified hyperlipidemia, Other specified cardiac dysrhythmias(427.89), Paroxysmal supraventricular tachycardia, Personal history of other infectious and parasitic disease, Plantar fascial fibromatosis, Pneumonia, and Syncope and collapse.   Surgical History:   Past Surgical History:  Procedure Laterality Date   CATARACT EXTRACTION     CHOLECYSTECTOMY     COLONOSCOPY     colonscopy     status post   CORONARY ANGIOPLASTY WITH STENT PLACEMENT     ESOPHAGOGASTRODUODENOSCOPY     status post   INSERTION OF MESH N/A 01/30/2017   Procedure: INSERTION OF MESH;  Surgeon: Sebastian Moles, MD;  Location: [facility];  Service: General;  Laterality: N/A;   KNEE ARTHROSCOPY     LEFT HEART CATHETERIZATION WITH CORONARY ANGIOGRAM  08/07/2011   Procedure: LEFT HEART CATHETERIZATION WITH CORONARY ANGIOGRAM;  Surgeon: Lonni JONETTA Cash, MD;  Location: [facility] CATH LAB;  Service: Cardiovascular;;   UMBILICAL HERNIA REPAIR N/A 01/30/2017   Procedure: REPAIR OF UMBILICAL HERNIA;  Surgeon: Sebastian Moles, MD;  Location: [facility];  Service: General;  Laterality: N/A;     Social History:   reports that he quit smoking about 36 years ago. His smoking use included cigarettes. He started smoking about 61 years ago. He has a 50 pack-year smoking history. He has never been exposed to tobacco smoke. He has never used smokeless tobacco. He reports that he does not currently use alcohol after a past usage of about 12.0 standard drinks of alcohol per week. He reports that he does not use drugs.   Family History:  His family history includes Coronary artery disease in his brother and another family member; Heart disease in his father; Rectal cancer in his brother. There is no history of Sleep apnea.   Allergies Allergies  Allergen Reactions   Amlodipine Swelling and Other (See Comments)    Ankle swelling     Home Medications  Prior to Admission medications    Medication Sig Start Date End Date Taking? Authorizing Provider  albuterol  (PROVENTIL ) (2.5 MG/3ML) 0.083% nebulizer solution Take 3 mLs (2.5 mg total) by nebulization every 6 (six) hours as needed for wheezing or shortness of breath. 08/12/22   Sood, Vineet, MD  albuterol  (PROVENTIL ) (2.5 MG/3ML) 0.083% nebulizer solution Take 3 mL (2.5 mg total) by nebulization every 6 (six) hours as needed for wheezing or shortness of breath. 07/22/23     amoxicillin -clavulanate (AUGMENTIN ) 875-125 MG tablet Take 1 tablet by mouth 2 (two) times a day for 7 days. 07/22/23     aspirin  EC 81 MG tablet Take 1 tablet (81 mg total) by mouth daily. 06/24/17   Parthenia Olivia HERO, PA-C  atorvastatin  (LIPITOR) 20 MG tablet Take 1 tablet (20 mg total) by mouth daily. 11/09/23     B Complex-C (SUPER B COMPLEX PO) Take 1 tablet by mouth daily.    [provider]  Calcium  Carbonate-Vitamin D  (CALCIUM -D  PO) Take 1-1.5 tablets by mouth See admin instructions. Take 1 tablet in the morning and 1.5 tablets at night    [provider]  Cholecalciferol  (VITAMIN D ) 50 MCG (2000 UT) tablet Take 2,000 Units by mouth daily.    [provider]  dextromethorphan-guaiFENesin (ROBITUSSIN-DM) 10-100 MG/5ML liquid Take 10 mLs by mouth every 6 (six) hours as needed for cough.    [provider]  diphenhydrAMINE (BENADRYL) 25 MG tablet Take 25 mg by mouth daily as needed for allergies.    [provider]  Docusate Sodium  (DSS) 100 MG CAPS Take 1 capsule by mouth 2 (two) times daily. 04/11/21   [provider]  doxycycline  (VIBRA -TABS) 100 MG tablet Take 1 tablet (100 mg total) by mouth 2 (two) times daily. 08/12/22   Sood, Vineet, MD  famotidine  (PEPCID ) 20 MG tablet Take 1 tablet (20 mg total) by mouth daily. 01/26/23     Fiber POWD Take 1 Scoop by mouth daily as needed (constipation).    [provider]  fluticasone  (CUTIVATE ) 0.005 % ointment Apply 1 Application topically 2 (two) times daily  as needed for flare 01/28/23     hydrochlorothiazide  (HYDRODIURIL ) 25 MG tablet Take 0.5 tablets (12.5 mg total) by mouth daily. 03/19/23   Lelon Hamilton T, PA-C  ibuprofen  (ADVIL ) 200 MG tablet Take by mouth as needed for headache or moderate pain. 04/03/21   [provider]  magnesium  oxide (MAG-OX) 400 MG tablet Take by mouth.    [provider]  metoprolol  tartrate (LOPRESSOR ) 50 MG tablet Take 1 tablet (50 mg total) by mouth 2 (two) times daily. 03/18/23 03/14/24  Lelon Hamilton T, PA-C  MISC NATURAL PRODUCTS PO Take 7 mg by mouth daily. CBD Gummies    [provider]  nitroGLYCERIN  (NITROSTAT ) 0.4 MG SL tablet Place 1 tablet (0.4 mg total) under the tongue every 5 (five) minutes as needed for chest pain. 07/23/22   Parthenia Olivia HERO, PA-C  olmesartan  (BENICAR ) 40 MG tablet Take 1 tablet (40 mg total) by mouth daily. 08/07/23   Verlin Lonni BIRCH, MD  polyethylene glycol (MIRALAX  / GLYCOLAX ) 17 g packet Take by mouth as needed for moderate constipation. 04/09/21   [provider]  PSYLLIUM PO Take by mouth.    [provider]  Respiratory Therapy Supplies (FLUTTER) DEVI Use as directed 06/14/19   Sood, Vineet, MD  Simethicone  125 MG CAPS Take 1 capsule by mouth as needed.    [provider]  sodium chloride  HYPERTONIC 3 % nebulizer solution Take 4 mLs by nebulization as needed for chest congestion. 08/12/22   Shellia Oh, MD  sodium chloride  HYPERTONIC 3 % nebulizer solution Take 4 mL by nebulization 2 (two) times a day as needed for shortness of breath, wheezing or cough. 07/22/23     tetrahydrozoline-zinc (VISINE-AC) 0.05-0.25 % ophthalmic solution Apply to eye as needed (dry eyes). 04/03/21   [provider]  vitamin E 180 MG (400 UNITS) capsule Take 400 Units by mouth daily.    [provider]  enoxaparin  (LOVENOX ) 40 MG/0.4ML injection Inject 0.4 mLs (40 mg total) into the skin daily for 14 days. Patient not taking: Reported on  03/18/2023 04/09/21 04/09/21       Critical care time: ***

## 2024-01-07 NOTE — ED Triage Notes (Signed)
 Pt arrive by [facility] from his car after he was found unresponsive by bystanders, CPR started by EMS at 21:47 3 shocks given, 2 epis, 300 MG Amiodarone, 500 mL NS, 50 mcg Fentanyl  and 2.5 Versed  IV given pta to ED. Pulse back at 22:10 per EMS. Un able to know how long pt was down prior to CPR started.

## 2024-01-07 NOTE — ED Provider Notes (Incomplete)
 [place] EMERGENCY DEPARTMENT AT [facility] Provider Note  MDM   HPI/ROS:  Tommy Santiago is a 78 y.o. male with a PMH CAD, GERD, hepatitis C, HLD, HTN, HLD, PSVT, chronic cirrhosis who presents to the emergency department following a cardiac arrest.  Patient was BIBEMS from his car after he was found unresponsive by bystanders.  CPR was started by EMS at 21:47, shock was advised, 3 shocks were given, 2 rounds of epi with ROSC at a time of 22:10 per EMS.  Patient was then given 300 mg amiodarone, 500 mL normal saline bolus and 50 mcg fentanyl  and 2.5 Versed  IV prior to intubation in the field and patient is biting down on the tube. - Additional history was obtained by wife via phone, she states it her husband was at a hockey game, was on his way home.  She states that he intermittently was complaining of chest pain over the last several days increased nitro use.  Physical exam is notable for: - Abdominal distention, without rigidity or peritonitic abdomen -- Lungs with diffuse rhonchi -- Patient is pale, cold -- Pupils are 2 mm and equal and nonreactive bilaterally  On my initial evaluation, patient is:  -Vital signs significant for normotension, bradycardia at a rate of 53, tachypneic, saturating well on vent. Patient afebrile, hemodynamically unstable and toxic appearing.  CBC WBC 15.5, Chem-8 potassium 3.4, elevated BUN/creatinine 26/1.6.  Initial lactic of 10.9 VBG 7.052/47.9/13.8.  Alcohol less than 15.  Patient given broad-spectrum antibiotics, placed on propofol  drip after intubation.  and imaging is ordered.  CXR with widening of the mediastinal silhouette which is increased from prior.  Plan for CT imaging.  ET tube was advanced per x-ray.  This patient's current presentation, including their history and physical exam, is most consistent with ***. Differentials include ***.     Interpretations, interventions, and the patient's course of care are documented below.       ***   Disposition:  {ED Dispo:29898}  Clinical Impression: No diagnosis found.  Rx / DC Orders ED Discharge Orders     None       The plan for this patient was discussed with Dr. ***, who voiced agreement and who oversaw evaluation and treatment of this patient.   Clinical Complexity A medically appropriate history, review of systems, and physical exam was performed.  My independent interpretations of EKG, labs, and radiology are documented in the ED course above.   If decision rules were used in this patient's evaluation, they are listed below.  *** Click here for ABCD2, HEART and other calculatorsREFRESH Note before signing   Patient's presentation is most consistent with {EM COPA:27473}  Medical Decision Making Amount and/or Complexity of Data Reviewed Labs: ordered. Radiology: ordered. ECG/medicine tests: ordered.  Risk Prescription drug management. Decision regarding hospitalization.    HPI/ROS      See MDM section for pertinent HPI and ROS. A complete ROS was performed with pertinent positives/negatives noted above.   Past Medical History:  Diagnosis Date  . Bronchiectasis (HCC)   . Bronchiectasis (HCC)   . CAP (community acquired pneumonia)   . Carotid artery occlusion   . Coronary atherosclerosis of unspecified type of vessel, native or graft    status post cardiac cath w percutaneous coronary intervention using XIENCE drug-eluting stent to mid left anterior descending 07/23/08; LHC 08/07/11: Mid LAD stent patent, moderate size diagonal branch jailed by stent with 70% ostial stenosis, unchanged from 2010 with excellent flow down the diagonal  branch, mid circumflex 30%, proximal and mid RCA 20%, EF 60%.  Medical therapy continued.   SABRA GERD (gastroesophageal reflux disease)   . Hepatitis C    Treated Harmony  . Hyperlipemia   . Hypertension   . Hypopotassemia   . Lichen planus   . Other and unspecified hyperlipidemia   . Other specified cardiac  dysrhythmias(427.89)   . Paroxysmal supraventricular tachycardia   . Personal history of other infectious and parasitic disease   . Plantar fascial fibromatosis   . Pneumonia   . Syncope and collapse     Past Surgical History:  Procedure Laterality Date  . CATARACT EXTRACTION    . CHOLECYSTECTOMY    . COLONOSCOPY    . colonscopy     status post  . CORONARY ANGIOPLASTY WITH STENT PLACEMENT    . ESOPHAGOGASTRODUODENOSCOPY     status post  . INSERTION OF MESH N/A 01/30/2017   Procedure: INSERTION OF MESH;  Surgeon: Sebastian Moles, MD;  Location: [facility];  Service: General;  Laterality: N/A;  . KNEE ARTHROSCOPY    . LEFT HEART CATHETERIZATION WITH CORONARY ANGIOGRAM  08/07/2011   Procedure: LEFT HEART CATHETERIZATION WITH CORONARY ANGIOGRAM;  Surgeon: Lonni JONETTA Cash, MD;  Location: [facility] CATH LAB;  Service: Cardiovascular;;  . UMBILICAL HERNIA REPAIR N/A 01/30/2017   Procedure: REPAIR OF UMBILICAL HERNIA;  Surgeon: Sebastian Moles, MD;  Location: [facility];  Service: General;  Laterality: N/A;      Physical Exam   Vitals:   01/07/24 2243  Pulse: (!) 54  Resp: (!) 22  SpO2: (!) 79%    Physical Exam   Procedures   If procedures were preformed on this patient, they are listed below:  Procedures   @BBSIG @   Please note that this documentation was produced with the assistance of voice-to-text technology and may contain errors.

## 2024-01-07 NOTE — ED Provider Notes (Signed)
 [address] EMERGENCY DEPARTMENT AT [facility] Provider Note  MDM   HPI/ROS:  Tommy Santiago is a 78 y.o. male with a medical history as below who presents ***   Physical exam is notable for: -***  On my initial evaluation, patient is:  -Vital signs stable.*** Patient afebrile***, hemodynamically stable***, and non-toxic appearing.*** -Additional history obtained from ***  This patient's current presentation, including their history and physical exam, is most consistent with ***. Differentials include ***.     Interpretations, interventions, and the patient's course of care are documented below.      ***   Disposition:  {ED Dispo:29898}  Clinical Impression: No diagnosis found.  Rx / DC Orders ED Discharge Orders     None       The plan for this patient was discussed with Dr. ***, who voiced agreement and who oversaw evaluation and treatment of this patient.   Clinical Complexity A medically appropriate history, review of systems, and physical exam was performed.  My independent interpretations of EKG, labs, and radiology are documented in the ED course above.   If decision rules were used in this patient's evaluation, they are listed below.  *** Click here for ABCD2, HEART and other calculatorsREFRESH Note before signing   Patient's presentation is most consistent with {EM COPA:27473}  Medical Decision Making Amount and/or Complexity of Data Reviewed Labs: ordered. Radiology: ordered. ECG/medicine tests: ordered.  Risk Prescription drug management.    HPI/ROS      See MDM section for pertinent HPI and ROS. A complete ROS was performed with pertinent positives/negatives noted above.   Past Medical History:  Diagnosis Date   Bronchiectasis (HCC)    Bronchiectasis (HCC)    CAP (community acquired pneumonia)    Carotid artery occlusion    Coronary atherosclerosis of unspecified type of vessel, native or graft    status post cardiac cath w  percutaneous coronary intervention using XIENCE drug-eluting stent to mid left anterior descending 07/23/08; LHC 08/07/11: Mid LAD stent patent, moderate size diagonal branch jailed by stent with 70% ostial stenosis, unchanged from 2010 with excellent flow down the diagonal branch, mid circumflex 30%, proximal and mid RCA 20%, EF 60%.  Medical therapy continued.    GERD (gastroesophageal reflux disease)    Hepatitis C    Treated Harmony   Hyperlipemia    Hypertension    Hypopotassemia    Lichen planus    Other and unspecified hyperlipidemia    Other specified cardiac dysrhythmias(427.89)    Paroxysmal supraventricular tachycardia    Personal history of other infectious and parasitic disease    Plantar fascial fibromatosis    Pneumonia    Syncope and collapse     Past Surgical History:  Procedure Laterality Date   CATARACT EXTRACTION     CHOLECYSTECTOMY     COLONOSCOPY     colonscopy     status post   CORONARY ANGIOPLASTY WITH STENT PLACEMENT     ESOPHAGOGASTRODUODENOSCOPY     status post   INSERTION OF MESH N/A 01/30/2017   Procedure: INSERTION OF MESH;  Surgeon: Sebastian Moles, MD;  Location: [facility];  Service: General;  Laterality: N/A;   KNEE ARTHROSCOPY     LEFT HEART CATHETERIZATION WITH CORONARY ANGIOGRAM  08/07/2011   Procedure: LEFT HEART CATHETERIZATION WITH CORONARY ANGIOGRAM;  Surgeon: Lonni JONETTA Cash, MD;  Location: [facility] CATH LAB;  Service: Cardiovascular;;   UMBILICAL HERNIA REPAIR N/A 01/30/2017   Procedure: REPAIR OF UMBILICAL HERNIA;  Surgeon:  Sebastian Moles, MD;  Location: [facility];  Service: General;  Laterality: N/A;      Physical Exam   Vitals:   01/07/24 2243  Pulse: (!) 54  Resp: (!) 22  SpO2: (!) 79%    Physical Exam   Procedures   If procedures were preformed on this patient, they are listed below:  Procedures   @BBSIG @   Please note that this documentation was produced with the assistance of  voice-to-text technology and may contain errors.

## 2024-01-07 NOTE — Progress Notes (Signed)
 ED Pharmacy Antibiotic Sign Off An antibiotic consult was received from an ED provider for vancomycin and zosyn per pharmacy dosing for sepsis. A chart review was completed to assess appropriateness.  A single dose of vancomycin and zosyn placed by the ED provider.   The following one time order(s) were placed per pharmacy consult:  none  Further antibiotic and/or antibiotic pharmacy consults should be ordered by the admitting provider if indicated.   Thank you for allowing pharmacy to be a part of this patient's care.   Dorn Buttner, PharmD, BCPS 01/07/2024 11:14 PM ED Clinical Pharmacist -  [phone]

## 2024-01-08 ENCOUNTER — Inpatient Hospital Stay (HOSPITAL_COMMUNITY)

## 2024-01-08 DIAGNOSIS — K7460 Unspecified cirrhosis of liver: Secondary | ICD-10-CM | POA: Diagnosis present

## 2024-01-08 DIAGNOSIS — R579 Shock, unspecified: Secondary | ICD-10-CM

## 2024-01-08 DIAGNOSIS — E785 Hyperlipidemia, unspecified: Secondary | ICD-10-CM | POA: Diagnosis present

## 2024-01-08 DIAGNOSIS — J942 Hemothorax: Secondary | ICD-10-CM | POA: Diagnosis present

## 2024-01-08 DIAGNOSIS — I5032 Chronic diastolic (congestive) heart failure: Secondary | ICD-10-CM | POA: Diagnosis present

## 2024-01-08 DIAGNOSIS — I469 Cardiac arrest, cause unspecified: Secondary | ICD-10-CM | POA: Diagnosis not present

## 2024-01-08 DIAGNOSIS — E872 Acidosis, unspecified: Secondary | ICD-10-CM | POA: Diagnosis present

## 2024-01-08 DIAGNOSIS — M96A3 Multiple fractures of ribs associated with chest compression and cardiopulmonary resuscitation: Secondary | ICD-10-CM | POA: Diagnosis present

## 2024-01-08 DIAGNOSIS — J9601 Acute respiratory failure with hypoxia: Secondary | ICD-10-CM | POA: Diagnosis present

## 2024-01-08 DIAGNOSIS — I462 Cardiac arrest due to underlying cardiac condition: Secondary | ICD-10-CM | POA: Diagnosis present

## 2024-01-08 DIAGNOSIS — Z7982 Long term (current) use of aspirin: Secondary | ICD-10-CM | POA: Diagnosis not present

## 2024-01-08 DIAGNOSIS — N179 Acute kidney failure, unspecified: Secondary | ICD-10-CM

## 2024-01-08 DIAGNOSIS — D72829 Elevated white blood cell count, unspecified: Secondary | ICD-10-CM | POA: Diagnosis not present

## 2024-01-08 DIAGNOSIS — E8721 Acute metabolic acidosis: Secondary | ICD-10-CM | POA: Diagnosis not present

## 2024-01-08 DIAGNOSIS — I110 Hypertensive heart disease with heart failure: Secondary | ICD-10-CM | POA: Diagnosis present

## 2024-01-08 DIAGNOSIS — J690 Pneumonitis due to inhalation of food and vomit: Secondary | ICD-10-CM | POA: Diagnosis present

## 2024-01-08 DIAGNOSIS — Z515 Encounter for palliative care: Secondary | ICD-10-CM | POA: Diagnosis not present

## 2024-01-08 DIAGNOSIS — R4020 Unspecified coma: Secondary | ICD-10-CM | POA: Diagnosis present

## 2024-01-08 DIAGNOSIS — R569 Unspecified convulsions: Secondary | ICD-10-CM

## 2024-01-08 DIAGNOSIS — R739 Hyperglycemia, unspecified: Secondary | ICD-10-CM

## 2024-01-08 DIAGNOSIS — I219 Acute myocardial infarction, unspecified: Secondary | ICD-10-CM | POA: Diagnosis present

## 2024-01-08 DIAGNOSIS — E876 Hypokalemia: Secondary | ICD-10-CM | POA: Diagnosis present

## 2024-01-08 DIAGNOSIS — Z955 Presence of coronary angioplasty implant and graft: Secondary | ICD-10-CM | POA: Diagnosis not present

## 2024-01-08 DIAGNOSIS — Z66 Do not resuscitate: Secondary | ICD-10-CM | POA: Diagnosis present

## 2024-01-08 DIAGNOSIS — I4891 Unspecified atrial fibrillation: Secondary | ICD-10-CM | POA: Diagnosis present

## 2024-01-08 DIAGNOSIS — Z8249 Family history of ischemic heart disease and other diseases of the circulatory system: Secondary | ICD-10-CM | POA: Diagnosis not present

## 2024-01-08 DIAGNOSIS — I10 Essential (primary) hypertension: Secondary | ICD-10-CM

## 2024-01-08 DIAGNOSIS — I2510 Atherosclerotic heart disease of native coronary artery without angina pectoris: Secondary | ICD-10-CM | POA: Diagnosis present

## 2024-01-08 DIAGNOSIS — Z87891 Personal history of nicotine dependence: Secondary | ICD-10-CM | POA: Diagnosis not present

## 2024-01-08 DIAGNOSIS — I4901 Ventricular fibrillation: Secondary | ICD-10-CM | POA: Diagnosis present

## 2024-01-08 LAB — I-STAT VENOUS BLOOD GAS, ED
Acid-base deficit: 16 mmol/L — ABNORMAL HIGH (ref 0.0–2.0)
Bicarbonate: 13.2 mmol/L — ABNORMAL LOW (ref 20.0–28.0)
Calcium, Ion: 1.06 mmol/L — ABNORMAL LOW (ref 1.15–1.40)
HCT: 44 % (ref 39.0–52.0)
Hemoglobin: 15 g/dL (ref 13.0–17.0)
O2 Saturation: 54 %
Potassium: 3.8 mmol/L (ref 3.5–5.1)
Sodium: 136 mmol/L (ref 135–145)
TCO2: 14 mmol/L — ABNORMAL LOW (ref 22–32)
pCO2, Ven: 43.4 mmHg — ABNORMAL LOW (ref 44–60)
pH, Ven: 7.09 — CL (ref 7.25–7.43)
pO2, Ven: 39 mmHg (ref 32–45)

## 2024-01-08 LAB — POCT I-STAT 7, (LYTES, BLD GAS, ICA,H+H)
Acid-base deficit: 15 mmol/L — ABNORMAL HIGH (ref 0.0–2.0)
Acid-base deficit: 19 mmol/L — ABNORMAL HIGH (ref 0.0–2.0)
Bicarbonate: 13.9 mmol/L — ABNORMAL LOW (ref 20.0–28.0)
Bicarbonate: 10.4 mmol/L — ABNORMAL LOW (ref 20.0–28.0)
Calcium, Ion: 1.08 mmol/L — ABNORMAL LOW (ref 1.15–1.40)
Calcium, Ion: 1.05 mmol/L — ABNORMAL LOW (ref 1.15–1.40)
HCT: 34 % — ABNORMAL LOW (ref 39.0–52.0)
HCT: 35 % — ABNORMAL LOW (ref 39.0–52.0)
Hemoglobin: 11.6 g/dL — ABNORMAL LOW (ref 13.0–17.0)
Hemoglobin: 11.9 g/dL — ABNORMAL LOW (ref 13.0–17.0)
O2 Saturation: 93 %
O2 Saturation: 97 %
Patient temperature: 37.4
Potassium: 4.2 mmol/L (ref 3.5–5.1)
Potassium: 4.7 mmol/L (ref 3.5–5.1)
Sodium: 138 mmol/L (ref 135–145)
Sodium: 137 mmol/L (ref 135–145)
TCO2: 15 mmol/L — ABNORMAL LOW (ref 22–32)
TCO2: 11 mmol/L — ABNORMAL LOW (ref 22–32)
pCO2 arterial: 45.8 mmHg (ref 32–48)
pCO2 arterial: 36.7 mmHg (ref 32–48)
pH, Arterial: 7.09 — CL (ref 7.35–7.45)
pH, Arterial: 7.062 — CL (ref 7.35–7.45)
pO2, Arterial: 90 mmHg (ref 83–108)
pO2, Arterial: 130 mmHg — ABNORMAL HIGH (ref 83–108)

## 2024-01-08 LAB — ECHOCARDIOGRAM LIMITED
Area-P 1/2: 3.99 cm2
Height: 71 in
Weight: 3774.28 [oz_av]

## 2024-01-08 LAB — BASIC METABOLIC PANEL WITH GFR
Anion gap: 17 — ABNORMAL HIGH (ref 5–15)
BUN: 27 mg/dL — ABNORMAL HIGH (ref 8–23)
CO2: 14 mmol/L — ABNORMAL LOW (ref 22–32)
Calcium: 7.1 mg/dL — ABNORMAL LOW (ref 8.9–10.3)
Chloride: 105 mmol/L (ref 98–111)
Creatinine, Ser: 2.66 mg/dL — ABNORMAL HIGH (ref 0.61–1.24)
GFR, Estimated: 24 mL/min — ABNORMAL LOW (ref >60)
Glucose, Bld: 224 mg/dL — ABNORMAL HIGH (ref 70–99)
Potassium: 4.1 mmol/L (ref 3.5–5.1)
Sodium: 136 mmol/L (ref 135–145)

## 2024-01-08 LAB — BLOOD GAS, ARTERIAL
Acid-base deficit: 17 mmol/L — ABNORMAL HIGH (ref 0.0–2.0)
Bicarbonate: 10.9 mmol/L — ABNORMAL LOW (ref 20.0–28.0)
Drawn by: 62551
O2 Saturation: 100 %
Patient temperature: 37
pCO2 arterial: 32 mmHg (ref 32–48)
pH, Arterial: 7.14 — CL (ref 7.35–7.45)
pO2, Arterial: 105 mmHg (ref 83–108)

## 2024-01-08 LAB — COMPREHENSIVE METABOLIC PANEL WITH GFR
ALT: 39 U/L (ref 0–44)
AST: 46 U/L — ABNORMAL HIGH (ref 15–41)
Albumin: 3.8 g/dL (ref 3.5–5.0)
Alkaline Phosphatase: 72 U/L (ref 38–126)
Anion gap: 20 — ABNORMAL HIGH (ref 5–15)
BUN: 21 mg/dL (ref 8–23)
CO2: 16 mmol/L — ABNORMAL LOW (ref 22–32)
Calcium: 8.7 mg/dL — ABNORMAL LOW (ref 8.9–10.3)
Chloride: 100 mmol/L (ref 98–111)
Creatinine, Ser: 1.68 mg/dL — ABNORMAL HIGH (ref 0.61–1.24)
GFR, Estimated: 42 mL/min — ABNORMAL LOW (ref >60)
Glucose, Bld: 231 mg/dL — ABNORMAL HIGH (ref 70–99)
Potassium: 3.3 mmol/L — ABNORMAL LOW (ref 3.5–5.1)
Sodium: 136 mmol/L (ref 135–145)
Total Bilirubin: 0.8 mg/dL (ref 0.0–1.2)
Total Protein: 6.9 g/dL (ref 6.5–8.1)

## 2024-01-08 LAB — URINALYSIS, W/ REFLEX TO CULTURE (INFECTION SUSPECTED)
Bilirubin Urine: NEGATIVE
Glucose, UA: NEGATIVE mg/dL
Ketones, ur: NEGATIVE mg/dL
Leukocytes,Ua: NEGATIVE
Nitrite: NEGATIVE
Protein, ur: 30 mg/dL — AB
Specific Gravity, Urine: 1.011 (ref 1.005–1.030)
pH: 6 (ref 5.0–8.0)

## 2024-01-08 LAB — BRAIN NATRIURETIC PEPTIDE: B Natriuretic Peptide: 333.2 pg/mL — ABNORMAL HIGH (ref 0.0–100.0)

## 2024-01-08 LAB — RESP PANEL BY RT-PCR (RSV, FLU A&B, COVID)  RVPGX2
Influenza A by PCR: NEGATIVE
Influenza B by PCR: NEGATIVE
Resp Syncytial Virus by PCR: NEGATIVE
SARS Coronavirus 2 by RT PCR: NEGATIVE

## 2024-01-08 LAB — RAPID URINE DRUG SCREEN, HOSP PERFORMED
Amphetamines: NOT DETECTED
Barbiturates: NOT DETECTED
Benzodiazepines: NOT DETECTED
Cocaine: NOT DETECTED
Opiates: NOT DETECTED
Tetrahydrocannabinol: NOT DETECTED

## 2024-01-08 LAB — TYPE AND SCREEN
ABO/RH(D): B POS
Antibody Screen: NEGATIVE

## 2024-01-08 LAB — CBC
HCT: 36.2 % — ABNORMAL LOW (ref 39.0–52.0)
Hemoglobin: 11.9 g/dL — ABNORMAL LOW (ref 13.0–17.0)
MCH: 35.7 pg — ABNORMAL HIGH (ref 26.0–34.0)
MCHC: 32.9 g/dL (ref 30.0–36.0)
MCV: 108.7 fL — ABNORMAL HIGH (ref 80.0–100.0)
Platelets: 255 K/uL (ref 150–400)
RBC: 3.33 MIL/uL — ABNORMAL LOW (ref 4.22–5.81)
RDW: 12.5 % (ref 11.5–15.5)
WBC: 30.6 K/uL — ABNORMAL HIGH (ref 4.0–10.5)
nRBC: 0.2 % (ref 0.0–0.2)

## 2024-01-08 LAB — GLUCOSE, CAPILLARY
Glucose-Capillary: 219 mg/dL — ABNORMAL HIGH (ref 70–99)
Glucose-Capillary: 185 mg/dL — ABNORMAL HIGH (ref 70–99)
Glucose-Capillary: 228 mg/dL — ABNORMAL HIGH (ref 70–99)
Glucose-Capillary: 183 mg/dL — ABNORMAL HIGH (ref 70–99)

## 2024-01-08 LAB — TROPONIN I (HIGH SENSITIVITY)
Troponin I (High Sensitivity): 97 ng/L — ABNORMAL HIGH (ref <18)
Troponin I (High Sensitivity): 18729 ng/L (ref <18)

## 2024-01-08 LAB — I-STAT CG4 LACTIC ACID, ED: Lactic Acid, Venous: 8.9 mmol/L (ref 0.5–1.9)

## 2024-01-08 LAB — ABO/RH: ABO/RH(D): B POS

## 2024-01-08 LAB — LACTIC ACID, PLASMA: Lactic Acid, Venous: >9 mmol/L (ref 0.5–1.9)

## 2024-01-08 LAB — PHOSPHORUS: Phosphorus: 6.8 mg/dL — ABNORMAL HIGH (ref 2.5–4.6)

## 2024-01-08 LAB — MRSA NEXT GEN BY PCR, NASAL: MRSA by PCR Next Gen: NOT DETECTED

## 2024-01-08 LAB — TRIGLYCERIDES: Triglycerides: 163 mg/dL — ABNORMAL HIGH (ref <150)

## 2024-01-08 LAB — MAGNESIUM: Magnesium: 2.1 mg/dL (ref 1.7–2.4)

## 2024-01-08 MED ORDER — SODIUM BICARBONATE 8.4 % IV SOLN
INTRAVENOUS | Status: AC
  Filled 2024-01-08: qty 100

## 2024-01-08 MED ORDER — ONDANSETRON HCL 4 MG/2ML IJ SOLN: 4.0000 mg | Freq: Four times a day (QID) | INTRAMUSCULAR | Status: DC | PRN

## 2024-01-08 MED ORDER — FENTANYL 2500MCG IN NS 250ML (10MCG/ML) PREMIX INFUSION: 0.0000 ug/h | INTRAVENOUS | Status: DC

## 2024-01-08 MED ORDER — ORAL CARE MOUTH RINSE: 15.0000 mL | OROMUCOSAL | Status: DC | PRN

## 2024-01-08 MED ORDER — HEPARIN (PORCINE) 25000 UT/250ML-% IV SOLN: 1200.0000 [IU]/h | INTRAVENOUS | Status: DC

## 2024-01-08 MED ORDER — NOREPINEPHRINE 4 MG/250ML-% IV SOLN
0.0000 ug/min | INTRAVENOUS | Status: DC
  Administered 2024-01-08: 38 ug/min via INTRAVENOUS
  Administered 2024-01-08: 30 ug/min via INTRAVENOUS
  Administered 2024-01-08: 38 ug/min via INTRAVENOUS
  Administered 2024-01-08: 40 ug/min via INTRAVENOUS
  Filled 2024-01-08 (×3): qty 250

## 2024-01-08 MED ORDER — FAMOTIDINE 20 MG PO TABS
20.0000 mg | ORAL_TABLET | Freq: Every day | ORAL | Status: DC
  Administered 2024-01-08: 20 mg
  Filled 2024-01-08: qty 1

## 2024-01-08 MED ORDER — CALCIUM GLUCONATE-NACL 2-0.675 GM/100ML-% IV SOLN
2.0000 g | Freq: Once | INTRAVENOUS | Status: AC
  Administered 2024-01-08: 2000 mg via INTRAVENOUS
  Filled 2024-01-08: qty 100

## 2024-01-08 MED ORDER — SODIUM CHLORIDE 0.9 % IV SOLN: INTRAVENOUS | Status: DC

## 2024-01-08 MED ORDER — GLYCOPYRROLATE 1 MG PO TABS: 1.0000 mg | ORAL_TABLET | ORAL | Status: DC | PRN

## 2024-01-08 MED ORDER — MIDAZOLAM HCL 2 MG/2ML IJ SOLN
INTRAMUSCULAR | Status: AC
  Administered 2024-01-08: 2 mg via INTRAVENOUS
  Filled 2024-01-08: qty 2

## 2024-01-08 MED ORDER — MIDAZOLAM HCL (PF) 2 MG/2ML IJ SOLN: 2.0000 mg | Freq: Once | INTRAMUSCULAR | Status: AC

## 2024-01-08 MED ORDER — EPINEPHRINE HCL 5 MG/250ML IV SOLN IN NS
INTRAVENOUS | Status: AC
  Administered 2024-01-08: 1.5 ug/min via INTRAVENOUS
  Filled 2024-01-08: qty 250

## 2024-01-08 MED ORDER — SODIUM CHLORIDE 0.9 % IV SOLN
250.0000 mL | INTRAVENOUS | Status: DC
  Administered 2024-01-08 (×2): 250 mL via INTRAVENOUS

## 2024-01-08 MED ORDER — EPINEPHRINE HCL 5 MG/250ML IV SOLN IN NS
0.5000 ug/min | INTRAVENOUS | Status: DC
  Administered 2024-01-08: 0.5 ug/min via INTRAVENOUS
  Filled 2024-01-08: qty 250

## 2024-01-08 MED ORDER — POLYETHYLENE GLYCOL 3350 17 G PO PACK: 17.0000 g | PACK | Freq: Every day | ORAL | Status: DC | PRN

## 2024-01-08 MED ORDER — CHLORHEXIDINE GLUCONATE CLOTH 2 % EX PADS: 6.0000 | MEDICATED_PAD | Freq: Every day | CUTANEOUS | Status: DC

## 2024-01-08 MED ORDER — HEPARIN BOLUS VIA INFUSION
4000.0000 [IU] | Freq: Once | INTRAVENOUS | Status: DC
  Filled 2024-01-08: qty 4000

## 2024-01-08 MED ORDER — SODIUM BICARBONATE 8.4 % IV SOLN
100.0000 meq | Freq: Once | INTRAVENOUS | Status: AC
  Administered 2024-01-08: 100 meq via INTRAVENOUS
  Filled 2024-01-08: qty 100

## 2024-01-08 MED ORDER — SODIUM CHLORIDE 0.9 % IV BOLUS
1000.0000 mL | Freq: Once | INTRAVENOUS | Status: AC
  Administered 2024-01-08: 1000 mL via INTRAVENOUS

## 2024-01-08 MED ORDER — ORAL CARE MOUTH RINSE
15.0000 mL | OROMUCOSAL | Status: DC
  Administered 2024-01-08 (×4): 15 mL via OROMUCOSAL

## 2024-01-08 MED ORDER — MIDAZOLAM HCL (PF) 2 MG/2ML IJ SOLN
1.0000 mg | INTRAMUSCULAR | Status: DC | PRN
  Administered 2024-01-08: 1 mg via INTRAVENOUS

## 2024-01-08 MED ORDER — LACTATED RINGERS IV BOLUS
1000.0000 mL | Freq: Once | INTRAVENOUS | Status: AC
  Administered 2024-01-08: 1000 mL via INTRAVENOUS

## 2024-01-08 MED ORDER — ACETAMINOPHEN 650 MG RE SUPP: 650.0000 mg | Freq: Four times a day (QID) | RECTAL | Status: DC | PRN

## 2024-01-08 MED ORDER — ENOXAPARIN SODIUM 40 MG/0.4ML IJ SOSY: 40.0000 mg | PREFILLED_SYRINGE | Freq: Every day | INTRAMUSCULAR | Status: DC

## 2024-01-08 MED ORDER — ACETAMINOPHEN 325 MG PO TABS: 650.0000 mg | ORAL_TABLET | Freq: Four times a day (QID) | ORAL | Status: DC | PRN

## 2024-01-08 MED ORDER — MIDAZOLAM-SODIUM CHLORIDE 100-0.9 MG/100ML-% IV SOLN
0.5000 mg/h | INTRAVENOUS | Status: DC
  Administered 2024-01-08: 0.5 mg/h via INTRAVENOUS
  Filled 2024-01-08: qty 100

## 2024-01-08 MED ORDER — HYDROCORTISONE SOD SUC (PF) 100 MG IJ SOLR
100.0000 mg | Freq: Two times a day (BID) | INTRAMUSCULAR | Status: DC
  Administered 2024-01-08: 100 mg via INTRAVENOUS
  Filled 2024-01-08: qty 2

## 2024-01-08 MED ORDER — IOHEXOL 350 MG/ML SOLN
75.0000 mL | Freq: Once | INTRAVENOUS | Status: AC | PRN
  Administered 2024-01-08: 75 mL via INTRAVENOUS

## 2024-01-08 MED ORDER — NOREPINEPHRINE 16 MG/250ML-% IV SOLN
0.0000 ug/min | INTRAVENOUS | Status: DC
  Administered 2024-01-08: 40 ug/min via INTRAVENOUS
  Filled 2024-01-08: qty 250

## 2024-01-08 MED ORDER — FAMOTIDINE 20 MG PO TABS: 20.0000 mg | ORAL_TABLET | Freq: Two times a day (BID) | ORAL | Status: DC

## 2024-01-08 MED ORDER — NOREPINEPHRINE 4 MG/250ML-% IV SOLN
0.0000 ug/min | INTRAVENOUS | Status: DC
  Administered 2024-01-08: 5 ug/min via INTRAVENOUS
  Filled 2024-01-08 (×2): qty 250

## 2024-01-08 MED ORDER — SODIUM CHLORIDE 0.9 % IV SOLN
2.0000 g | INTRAVENOUS | Status: DC
  Administered 2024-01-08: 2 g via INTRAVENOUS
  Filled 2024-01-08: qty 20

## 2024-01-08 MED ORDER — ROCURONIUM BROMIDE 10 MG/ML (PF) SYRINGE
PREFILLED_SYRINGE | INTRAVENOUS | Status: AC
  Administered 2024-01-08: 50 mg
  Filled 2024-01-08: qty 10

## 2024-01-08 MED ORDER — STERILE WATER FOR INJECTION IV SOLN
INTRAVENOUS | Status: DC
  Filled 2024-01-08: qty 1000

## 2024-01-08 MED ORDER — POLYVINYL ALCOHOL 1.4 % OP SOLN: 1.0000 [drp] | Freq: Four times a day (QID) | OPHTHALMIC | Status: DC | PRN

## 2024-01-08 MED ORDER — GLYCOPYRROLATE 0.2 MG/ML IJ SOLN: 0.2000 mg | INTRAMUSCULAR | Status: DC | PRN

## 2024-01-08 MED ORDER — VASOPRESSIN 20 UNITS/100 ML INFUSION FOR SHOCK
INTRAVENOUS | Status: AC
  Filled 2024-01-08: qty 100

## 2024-01-08 MED ORDER — DOCUSATE SODIUM 50 MG/5ML PO LIQD: 100.0000 mg | Freq: Two times a day (BID) | ORAL | Status: DC | PRN

## 2024-01-08 MED ORDER — PERFLUTREN LIPID MICROSPHERE
1.0000 mL | INTRAVENOUS | Status: AC | PRN
  Administered 2024-01-08: 6 mL via INTRAVENOUS

## 2024-01-08 MED ORDER — MIDAZOLAM-SODIUM CHLORIDE 100-0.9 MG/100ML-% IV SOLN: 0.0000 mg/h | INTRAVENOUS | Status: DC

## 2024-01-08 MED ORDER — SODIUM BICARBONATE 8.4 % IV SOLN
150.0000 meq | Freq: Once | INTRAVENOUS | Status: AC
  Administered 2024-01-08: 150 meq via INTRAVENOUS
  Filled 2024-01-08: qty 50

## 2024-01-08 MED ORDER — MIDAZOLAM BOLUS VIA INFUSION (WITHDRAWAL LIFE SUSTAINING TX)
2.0000 mg | INTRAVENOUS | Status: DC | PRN
  Administered 2024-01-08 (×2): 2 mg via INTRAVENOUS

## 2024-01-08 MED ORDER — FENTANYL BOLUS VIA INFUSION
100.0000 ug | INTRAVENOUS | Status: DC | PRN
  Administered 2024-01-08: 100 ug via INTRAVENOUS

## 2024-01-08 MED ORDER — INSULIN ASPART 100 UNIT/ML IJ SOLN
0.0000 [IU] | INTRAMUSCULAR | Status: DC
  Administered 2024-01-08: 7 [IU] via SUBCUTANEOUS
  Filled 2024-01-08: qty 7

## 2024-01-08 MED ORDER — VASOPRESSIN 20 UNITS/100 ML INFUSION FOR SHOCK
0.0000 [IU]/min | INTRAVENOUS | Status: DC
  Administered 2024-01-08: 0.04 [IU]/min via INTRAVENOUS
  Filled 2024-01-08: qty 100

## 2024-01-08 MED ORDER — INSULIN ASPART 100 UNIT/ML IJ SOLN: 0.0000 [IU] | INTRAMUSCULAR | Status: DC

## 2024-01-12 LAB — CULTURE, BLOOD (ROUTINE X 2)
Culture: NO GROWTH
Culture: NO GROWTH
Special Requests: ADEQUATE

## 2024-01-25 NOTE — Inpatient Diabetes Management (Signed)
 Inpatient Diabetes Program Recommendations  AACE/ADA: New Consensus Statement on Inpatient Glycemic Control (2015)  Target Ranges:  Prepandial:   less than 140 mg/dL      Peak postprandial:   less than 180 mg/dL (1-2 hours)      Critically ill patients:  140 - 180 mg/dL   Lab Results  Component Value Date   GLUCAP 228 (H) 01/15/2024    Review of Glycemic Control  Latest Reference Range & Units 01/02/2024 04:31 01/10/2024 07:29 01/09/2024 11:43  Glucose-Capillary 70 - 99 mg/dL 780 (H) 814 (H) 771 (H)  (H): Data is abnormally high  Diabetes history: No DM Outpatient Diabetes medications: None Current orders for Inpatient glycemic control: Novolog 0-20 units Q4H, Solu-Cortef 100 mg Q12H  Inpatient Diabetes Program Recommendations:    If appropriate, Might consider:  Semglee 15 units every day.  Thank you, Wyvonna Pinal, MSN, CDCES Diabetes Coordinator Inpatient Diabetes Program [phone] (team pager from 8a-5p)

## 2024-01-25 NOTE — Progress Notes (Signed)
 Initial Nutrition Assessment  DOCUMENTATION CODES:  Not applicable  INTERVENTION:  Not medically stable enough for tube feed initiation today.   When medically safe to initiate, recommend: Starting Osmolite 1.5 at 15ml/hr  As able, advance by 10ml q8h to goal rate of 25ml/hr ( per day) Add 60ml ProSource TF20 once daily TF at goal rate provides 2060 kcal, 103g protein and free water daily  NUTRITION DIAGNOSIS:  Inadequate oral intake related to acute illness as evidenced by NPO status.  GOAL:  Patient will meet greater than or equal to 90% of their needs  MONITOR:  Vent status, Labs, Weight trends, I & O's  REASON FOR ASSESSMENT:  Ventilator    ASSESSMENT:  Pt admitted post cardiac arrest. PMH significant for bronchiextasis, CAD s/p PCI, GERD, HCC, HLD, HTN, SVT, HFpEF.  11/13 cardiac arrest; admitted; intubated  Patient is currently intubated on ventilator support MV: 17 L/min Temp (24hrs), Avg:99.1 F (37.3 C), Min:96.3 F (35.7 C), Max:99.9 F (37.7 C) MAP (a-line) 63-21mmHg since 0500 this morning.  Patient discussed in rounds.  Currently with undifferentiated shock +/- cardiogenic shock. Awaiting CT head, chest, abdomen.  Tracking providers today.   Spoke with patients wife at bedside. She states that he was in his usual state of health up until cardiac arrest.   He takes yoga Monday through Friday and tries to stay active.   He drinks coffee every morning. On Monday's he fasts most of the day. In the evening he will prepare a meal for dinner. The remainder of the week, he typically eats 3 meals per day with a snack such as nuts.  Breakfast is always a kale smoothie with other various fruits/vegetables and almond milk. For lunch and dinner she mentions that he will eat a salad with something else such as a sandwich. Yesterday for dinner prior to arrest, he had muscles with a salad and leftover vegetables from a previous meal.   Pt's wife reports  that they had recently been traveling and given he has been out of his normal routine, he had put on about 40 lbs since August. His abdomen appears distended and taut though she reports that this has been normal for him.   Admit weight: 107 kg (unknown method of measurement- stated vs measured) Within the last year, documentation of weights reflects an increase of 4.5 kg if current weight deemed accurate.  Drains/lines: OGT (tip/side port within mid stomach) Right femoral, triple lumen CVC  Medications: pepcid , solu-cortef, SSI 0-20 units q4h Drips: IV abx Epi @ 1.5 mcg/min Levo @ 38 mcg/min Sodium bicarb Vaso @ 0.04 units/min  Labs:  BUN 27 Cr 2.66 Anion gap 17 Ionized calcium  1.05 Phosphorus 6.8 GFR 24 Lactic acid 8.9 CBG's 185-228 x24 hours  NUTRITION - FOCUSED PHYSICAL EXAM: Deferred to follow up. Pt   Diet Order:   Diet Order             Diet NPO time specified  Diet effective now                   EDUCATION NEEDS:  No education needs have been identified at this time  Skin:  Skin Assessment: Reviewed RN Assessment  Last BM:  unknown; abdomen distended  Height:  Ht Readings from Last 1 Encounters:  01/07/24 5' 11 (1.803 m)    Weight:  Wt Readings from Last 1 Encounters:  01/07/24 107 kg    Ideal Body Weight:  78.2 kg  BMI:  Body mass index  is 32.9 kg/m.  Estimated Nutritional Needs:   Kcal:  2000-2200  Protein:  100-115g  Fluid:  >/=2L  Royce Maris, RDN, LDN Clinical Nutrition See AMiON for contact information.

## 2024-01-25 NOTE — Death Summary Note (Signed)
 DEATH SUMMARY   Patient Details  Name: Tommy Santiago MRN: [id number] DOB: 1945-05-02 ERE:FrJoyjwb, Lonni BIRCH, MD  Admission/Discharge Information   Admit Date:  2024-01-29  Date of Death: Date of Death: 01-30-24  Time of Death: Time of Death: February 08, 1800  Length of Stay: 1   Principle Cause of death: Myocardial infarction/ventricular fibrillation cardiac arrest  Hospital Diagnoses: Principal Problem:   Cardiac arrest [facility])   Hospital Course: This 78 year old man was a history of coronary artery disease was found to be unresponsive in his car by bystanders.  He was found in V-fib cardiac arrest.  CPR was started and 3 shocks were given and route.  Patient got ROSC eventually.  The downtime in cardiac arrest is unknown.  Presumed to be more than 20 minutes.  Patient on arrival was in severe cardiogenic shock needing 3 pressors which are maxed out.  EEG showed encephalopathy no seizures.  His CT scan of the chest showed broken ribs and hemothorax.  His troponin is significantly elevated concerning for myocardial infarction.  At this stage further cares are discussed with the patient's wife and the son.  They both reiterated that patient is a DNR/DNI and never wanted any invasive procedures.  They both decided to go comfort care patient is extubated eventually and passed away at 1801 hrs.       Procedures: Central venous catheterization, arterial catheterization, endotracheal intubation, EEG  Consultations: None  The results of significant diagnostics from this hospitalization (including imaging, microbiology, ancillary and laboratory) are listed below for reference.   Significant Diagnostic Studies: CT Angio Chest PE W and/or Wo Contrast Result Date: Jan 30, 2024 EXAM: CTA CHEST 01/30/2024 03:23:41 PM TECHNIQUE: CTA of the chest was performed after the administration of intravenous contrast. 75 mL (iohexol (OMNIPAQUE) 350 MG/ML injection 75 mL IOHEXOL 350 MG/ML SOLN) was  administered. Multiplanar reformatted images are provided for review. MIP images are provided for review. Automated exposure control, iterative reconstruction, and/or weight based adjustment of the mA/kV was utilized to reduce the radiation dose to as low as reasonably achievable. COMPARISON: None available. CLINICAL HISTORY: cardiac arrest FINDINGS: PULMONARY ARTERIES: Pulmonary arteries are adequately opacified for evaluation. No acute pulmonary embolus. Main pulmonary artery is normal in caliber. MEDIASTINUM: Endotracheal tube tip 1.9 cm above the carina. Nasogastric tube extends through the esophagus into the stomach. Thoracic aortic, coronary artery, and branch vessel atheromatous vascular calcifications. Anterior mediastinal/substernal hematoma noted, mostly bandlike and low volume. This also extends along the right anterior pericardium. Mitral valve calcifications. LYMPH NODES: No mediastinal, hilar or axillary lymphadenopathy. LUNGS AND PLEURA: Large right and small left pleural effusion. The right pleural effusion has a density of about 35 HU and hemothorax is a possibility. Passive atelectasis associated with the pleural effusions. 4 mm right lower lobe subpleural nodule along the major fissure, probably a subpleural lymph node although technically nonspecific due to small size. No focal consolidation or pulmonary edema. No pneumothorax. UPPER ABDOMEN: Nasogastric tube extends into the stomach. Limited images of the upper abdomen are otherwise unremarkable. SOFT TISSUES AND BONES: Subcutaneous edema overlying the sternum and medial pectoral musculature probably from bruising. Fractures of the right 2nd, 3rd, 4th, 5th, 6th, 7th, and 8th ribs noted with the 2nd through 6th rib fractures being displaced. Acute relatively nondisplaced fractures of the left 2nd, 3rd, 4th, 5th ribs. Oblique fracture of the sternal manubrium with up to 1 cm displacement along the anterior cortex. Remote mid thoracic wedge  compression fracture unchanged from previous. IMPRESSION: 1. No evidence  of pulmonary embolism. 2. Large right pleural effusion measuring approximately 35 HU, suspicious for hemothorax, and small left pleural effusion. 3. Oblique fracture of the sternal manubrium with up to 1 cm anterior cortical displacement. 4. Acute fractures of the right 2nd8th ribs, with displacement of the 2nd6th ribs. 5. Acute relatively nondisplaced fractures of the left 2nd5th ribs. 6. Anterior mediastinal/substernal hematoma, predominantly bandlike and low volume, extending along the right anterior pericardium. 7. Passive atelectasis related to pleural effusions. Electronically signed by: Ryan Salvage MD January 24, 2024 04:21 PM EST RP Workstation: [id number]   CT ABDOMEN PELVIS W CONTRAST Result Date: 01-24-24 EXAM: CT ABDOMEN AND PELVIS WITH CONTRAST 2024/01/24 03:23:41 PM TECHNIQUE: CT of the abdomen and pelvis was performed with the administration of 75 mL of iohexol (OMNIPAQUE) 350 MG/ML injection. Multiplanar reformatted images are provided for review. Automated exposure control, iterative reconstruction, and/or weight-based adjustment of the mA/kV was utilized to reduce the radiation dose to as low as reasonably achievable. COMPARISON: MRI abdomen 12/16/2023. CLINICAL HISTORY: Cardiac arrest. FINDINGS: LOWER CHEST: No acute abnormality. LIVER: Previous lateral segment left hepatic lobe ablation site is relatively indistinct on this exam but better seen on the prior MRI. Cirrhotic liver morphology. Faintly accentuated enhancement in segments 5 and 6 compared to the rest of the liver possibly from congestion or the femoral/lower extremity injection of contrast. GALLBLADDER AND BILE DUCTS: Gallbladder surgically absent. No biliary ductal dilatation. SPLEEN: No acute abnormality. PANCREAS: Severe fatty replacement of the pancreas. ADRENAL GLANDS: No acute abnormality. KIDNEYS, URETERS AND BLADDER: Foley catheter in the  otherwise empty urinary bladder. Simple cyst of the left mid kidney laterally. Per consensus, no follow-up is needed for simple Bosniak type 1 and 2 renal cysts, unless the patient has a malignancy history or risk factors. No stones in the kidneys or ureters. No hydronephrosis. No perinephric or periureteral stranding. GI AND BOWEL: Nasogastric tube is present with tip in the stomach body. Stomach demonstrates no acute abnormality. There is no bowel obstruction. No pneumatosis or portal venous gas. PERITONEUM AND RETROPERITONEUM: No ascites. No free air. VASCULATURE: Aorta is normal in caliber. Systemic atherosclerosis is present, including the aorta and iliac arteries. Substantial atheromatous plaque at the origin of the celiac trunk and superior mesenteric artery could predispose the patient to chronic mesenteric ischemia. The celiac trunk appears somewhat smaller in caliber but on the MRI of 12/16/2023, raising the possibility of vasospasm which could likewise contribute to mesenteric ischemia. However, neither of the celiac trunk or superior mesenteric artery appears completely occluded at this time. The right femoral venous line and possible second small caliber arterial line are noted in the right groin subcutaneous tissues. LYMPH NODES: Right gastric node 0.8 cm in short axis on image 20 series 12, within normal limits. No lymphadenopathy. REPRODUCTIVE ORGANS: Mild prostatomegaly. BONES AND SOFT TISSUES: No acute osseous abnormality. Right femoral venous line. There is also linear density extending through the right groin subcutaneous tissues to the right superficial femoral artery which could be a second small caliber arterial line. IMPRESSION: 1. Cirrhotic liver morphology with relatively increased enhancement in segments 5 and 6, possibly related to contrast injection site or congestion. 2. Severe fatty replacement of the pancreas. 3. Systemic atherosclerosis with substantial atheromatous plaque at the  origins of the celiac trunk and superior mesenteric artery, which could predispose to chronic mesenteric ischemia. The celiac trunk in particular appears narrowed compared to 12/16/2023 and there could be a component of vasospasm. No complete occlusion of either vessel observed. Electronically signed by:  Ryan Salvage MD 01-13-2024 04:08 PM EST RP Workstation: [id number]   CT Head Wo Contrast Result Date: 01/13/2024 EXAM: CT HEAD WITHOUT CONTRAST 13-Jan-2024 03:23:41 PM TECHNIQUE: CT of the head was performed without the administration of intravenous contrast. Automated exposure control, iterative reconstruction, and/or weight based adjustment of the mA/kV was utilized to reduce the radiation dose to as low as reasonably achievable. COMPARISON: CT head 09/22/2019. CLINICAL HISTORY: cardiac arrest FINDINGS: BRAIN AND VENTRICLES: No acute hemorrhage. No evidence of acute infarct. No hydrocephalus. No extra-axial collection. No mass effect or midline shift. Chronic microvascular ischemic changes which appear slightly increased from prior. Mild generalized parenchymal volume loss. Atherosclerosis at the skull base involving the Carotid Siphons and Intracranial Vertebral Arteries. ORBITS: Bilateral lens replacement. SINUSES: Scattered mucosal thickening in the Ethmoid Sinuses. SOFT TISSUES AND SKULL: Partially visualized endotracheal tube and enteric tube. No skull fracture. IMPRESSION: 1. No acute intracranial abnormality. 2. Chronic microvascular ischemic changes, slightly increased from prior study. 3. Mild generalized parenchymal volume loss. Electronically signed by: Donnice Mania MD 01/13/24 03:59 PM EST RP Workstation: [id number]   EEG adult Result Date: 01/13/2024 Tommy Arlin KIDD, MD     01/13/2024  2:46 PM Patient Name: RODRIQUEZ THORNER MRN: [id number] Epilepsy Attending: Arlin Santiago Tommy Referring Physician/Provider: Claudene Fonda BROCKS, NP Date: 01-13-24 Duration: 22.36 mins Patient history:  78 year old male status post cardiac arrest.  EEG to evaluate for seizure. Level of alertness: Awake, asleep AEDs during EEG study: None Technical aspects: This EEG study was done with scalp electrodes positioned according to the 10-20 International system of electrode placement. Electrical activity was reviewed with band pass filter of 1-70Hz , sensitivity of 7 uV/mm, display speed of 9mm/sec with a 60Hz  notched filter applied as appropriate. EEG data were recorded continuously and digitally stored.  Video monitoring was available and reviewed as appropriate. Description: During awake state, EEG showed continuous generalized predominantly 6 to 8 Hz theta-alpha activity admixed with intermittent generalized 2 to 3 Hz delta slowing.  Sleep was characterized by sleep spindles (12 to 14 Hz), maximal frontocentral region.  Hyperventilation and photic stimulation were not performed.   ABNORMALITY - Continuous slow, generalized IMPRESSION: This study is suggestive of diffuse cerebral dysfunction (encephalopathy). No seizures or epileptiform discharges were seen throughout the recording. Arlin Santiago Tommy   DG Abd Portable 1V Result Date: 01/13/2024 EXAM: 1 VIEW XRAY OF THE ABDOMEN 01/13/24 11:57:00 AM COMPARISON: None available. CLINICAL HISTORY: Encounter for orogastric (OG) tube placement. FINDINGS: LINES, TUBES AND DEVICES: There is an enteric tube present along the greater curvature of the mid stomach. BOWEL: Nonobstructive bowel gas pattern. SOFT TISSUES: Surgical clips are noted in the gallbladder fossa. No opaque urinary calculi. BONES: No acute osseous abnormality. LUNGS: There are hazy opacities present within the lung bases bilaterally. IMPRESSION: 1. Enteric tube in appropriate position along the greater curvature of the mid stomach. Electronically signed by: Evalene Coho MD 01-13-24 12:25 PM EST RP Workstation: [id number]   ECHOCARDIOGRAM LIMITED Result Date: 01-13-2024    ECHOCARDIOGRAM LIMITED  REPORT   Patient Name:   NOHLAN BURDIN Date of Exam: 01/13/2024 Medical Rec #:  [id number]       Height:       71.0 in Accession #:    [id number]      Weight:       235.9 lb Date of Birth:  November 02, 1945       BSA:          2.262 m Patient Age:    78  years        BP:           123/94 mmHg Patient Gender: M               HR:           111 bpm. Exam Location:  Inpatient Procedure: Limited Echo, Intracardiac Opacification Agent, Cardiac Doppler and            Limited Color Doppler (Both Spectral and Color Flow Doppler were            utilized during procedure). STAT ECHO Indications:    Cardiac Arrest I46.9  History:        Patient has prior history of Echocardiogram examinations, most                 recent 04/02/2023. Cardiac Arrest, CAD, Arrythmias:Tachycardia,                 Signs/Symptoms:Chest Pain and Shortness of Breath; Risk                 Factors:Hypertension and Dyslipidemia.  Sonographer:    Koleen Popper RDCS Referring Phys: [id number] JESSICA MARSHALL  Sonographer Comments: Technically challenging study due to limited acoustic windows, Technically difficult study due to poor echo windows and echo performed with patient supine and on artificial respirator. Image acquisition challenging due to patient body habitus and Image acquisition challenging due to respiratory motion. IMPRESSIONS  1. Very technically difficult study with very poor views. Ejection fraction appears preserved without any obvious regional wall motion abnormalites on limited views. . Left ventricular endocardial border not optimally defined to evaluate regional wall motion. Left ventricular diastolic parameters are consistent with Grade I diastolic dysfunction (impaired relaxation). Comparison(s): A prior study was performed on 04/02/2023. There was grade II diastolic dysfunction with an estimated ejection fraction of 65-70%. FINDINGS  Left Ventricle: Very technically difficult study with very poor views. Ejection fraction appears preserved  without any obvious regional wall motion abnormalites on limited views. Left ventricular endocardial border not optimally defined to evaluate regional wall motion. Definity contrast agent was given IV to delineate the left ventricular endocardial borders. Left ventricular diastolic parameters are consistent with Grade I diastolic dysfunction (impaired relaxation). Venous: IVC assessment for right atrial pressure unable to be performed due to mechanical ventilation. Additional Comments: Spectral Doppler performed. Color Doppler performed.   Diastology LV e' medial:    6.64 cm/s LV E/e' medial:  7.9 LV e' lateral:   7.40 cm/s LV E/e' lateral: 7.1  IVC IVC diam: 1.91 cm MITRAL VALVE MV Area (PHT): 3.99 cm MV Decel Time: 190 msec MV E velocity: 52.50 cm/s MV A velocity: 60.10 cm/s MV E/A ratio:  0.87 Emeline Calender Electronically signed by Emeline Calender Signature Date/Time: 01-17-24/12:13:20 PM    Final    DG CHEST PORT 1 VIEW Result Date: 2024/01/17 EXAM: 1 VIEW(S) XRAY OF THE CHEST Jan 17, 2024 10:50:00 PM COMPARISON: Chest CT from 06/15/2023. CLINICAL HISTORY: [id number] History of ETT [id number] History of ETT [phone] FINDINGS: LINES, TUBES AND DEVICES: Endotracheal tube has been advanced with the tip now 3.4 cm from the carina. Nasogastric tube has been withdrawn into the distal esophagus with the tip at least 8 cm from the EG junction and needs to be advanced back into the stomach. Electrical pads overlie the left chest. LUNGS AND PLEURA: There is perihilar vascular congestion with generalized interstitial edema. Small to moderate increasing pleural effusions, right greater than left. There is increased dense consolidation  or atelectasis in the left lower lobe. There is increased haziness over the right perihilar lung, which could be due to layering fluid, atelectasis, or consolidation. No pneumothorax. HEART AND MEDIASTINUM: The heart is enlarged. There is stable mediastinal widening with aortic atherosclerosis. BONES AND  SOFT TISSUES: There are multilevel displaced right rib fractures without pneumothorax, age indeterminate but not seen on a chest CT from 06/15/2023. IMPRESSION: 1. Endotracheal tube tip 3.4 cm above the carina, appropriate position. 2. Nasogastric tube retracted into the distal esophagus with tip at least 8 cm from the esophagogastric junction, requires advancement into the stomach. 3. Increased dense left lower lobe consolidation or atelectasis. 4. Increased right perihilar haziness that may reflect layering effusion, atelectasis, or consolidation. 5. Small or moderate increasing pleural effusions, right greater than left. 6. Multilevel displaced right rib fractures not present on chest CT from 06/15/2023, acuity unknown, no pneumothorax. Electronically signed by: Francis Quam MD January 21, 2024 06:41 AM EST RP Workstation: [id number]   DG Abd 1 View Result Date: 01-21-24 EXAM: 1 VIEW XRAY OF THE ABDOMEN January 21, 2024 06:07:00 AM COMPARISON: MRI abdomen 12/16/2023 CLINICAL HISTORY: [id number] Encounter for orogastric (OG) tube placement [id number] Encounter for orogastric (OG) tube placement FINDINGS: LINES, TUBES AND DEVICES: An enteric tube was not seen on this exam. There is tubing superimposing in the right hemipelvis which may represent a urinary catheter. BOWEL: There is mild to moderate retained stool in the ascending colon and gas distention of colonic loops without small bowel dilatation. Nonobstructive bowel gas pattern. SOFT TISSUES: There are cholecystectomy clips. The distal aorta and common iliac arteries are heavily calcified. No nephrolithiasis is seen. BONES: There is degenerative change of the lumbar spine. No acute osseous abnormality. IMPRESSION: 1. No acute abdominal process identified. Enteric tube was not seen. 2. Mild to moderate colonic stool burden and colonic gaseous distention without small-bowel dilation. 3. Extensive aortoiliac atherosclerotic calcification. Electronically signed by: Francis Quam MD Jan 21, 2024 06:26 AM EST RP Workstation: [id number]   DG Chest Port 1 View Result Date: 01/07/2024 CLINICAL DATA:  CPR arrest EXAM: PORTABLE CHEST 1 VIEW COMPARISON:  Chest x-ray 04/19/2020 FINDINGS: Endotracheal tube tip is high in position 9 cm above the carina. Enteric tube is seen at least to the level of the diaphragm. There is widening of the mediastinal silhouette which is increased from prior, possibly positional. Cardiac silhouette is within normal limits. There is bibasilar atelectasis. No pneumothorax. Healed bilateral rib fractures are new from prior. IMPRESSION: 1. Endotracheal tube tip is high in position 9 cm above the carina. Recommend advancement. 2. Widening of the mediastinal silhouette which is increased from prior, possibly positional. Recommend attention on follow-up. 3. Bibasilar atelectasis. Electronically Signed   By: Greig Pique M.D.   On: 01/07/2024 23:28    Microbiology: Recent Results (from the past 240 hours)  Blood Culture (routine x 2)     Status: None (Preliminary result)   Collection Time: 01/07/24 11:17 PM   Specimen: BLOOD  Result Value Ref Range Status   Specimen Description BLOOD BLOOD RIGHT HAND  Final   Special Requests   Final    BOTTLES DRAWN AEROBIC AND ANAEROBIC Blood Culture results may not be optimal due to an inadequate volume of blood received in culture bottles   Culture   Final    NO GROWTH 2 DAYS Performed at [facility], [address] [place], [place] [address]    Report Status PENDING  Incomplete  Blood Culture (routine x 2)  Status: None (Preliminary result)   Collection Time: 01/07/24 11:17 PM   Specimen: BLOOD  Result Value Ref Range Status   Specimen Description BLOOD BLOOD LEFT HAND  Final   Special Requests   Final    BOTTLES DRAWN AEROBIC AND ANAEROBIC Blood Culture adequate volume   Culture   Final    NO GROWTH 2 DAYS Performed at [facility], [address], [place], [place] [address]     Report Status PENDING  Incomplete  Resp panel by RT-PCR (RSV, Flu A&B, Covid) Anterior Nasal Swab     Status: None   Collection Time: 01/07/24 11:57 PM   Specimen: Anterior Nasal Swab  Result Value Ref Range Status   SARS Coronavirus 2 by RT PCR NEGATIVE NEGATIVE Final   Influenza A by PCR NEGATIVE NEGATIVE Final   Influenza B by PCR NEGATIVE NEGATIVE Final    Comment: (NOTE) The Xpert Xpress SARS-CoV-2/FLU/RSV plus assay is intended as an aid in the diagnosis of influenza from Nasopharyngeal swab specimens and should not be used as a sole basis for treatment. Nasal washings and aspirates are unacceptable for Xpert Xpress SARS-CoV-2/FLU/RSV testing.  Fact Sheet for Patients: bloggercourse.com  Fact Sheet for Healthcare Providers: seriousbroker.it  This test is not yet approved or cleared by the [place]  FDA and has been authorized for detection and/or diagnosis of SARS-CoV-2 by FDA under an Emergency Use Authorization (EUA). This EUA will remain in effect (meaning this test can be used) for the duration of the COVID-19 declaration under Section 564(b)(1) of the Act, 21 U.S.C. section 360bbb-3(b)(1), unless the authorization is terminated or revoked.     Resp Syncytial Virus by PCR NEGATIVE NEGATIVE Final    Comment: (NOTE) Fact Sheet for Patients: bloggercourse.com  Fact Sheet for Healthcare Providers: seriousbroker.it  This test is not yet approved or cleared by the [place]  FDA and has been authorized for detection and/or diagnosis of SARS-CoV-2 by FDA under an Emergency Use Authorization (EUA). This EUA will remain in effect (meaning this test can be used) for the duration of the COVID-19 declaration under Section 564(b)(1) of the Act, 21 U.S.C. section 360bbb-3(b)(1), unless the authorization is terminated or revoked.  Performed at [facility], [address] [place], [place] [address]   MRSA Next Gen by PCR, Nasal     Status: None   Collection Time: 01-20-2024  1:28 AM   Specimen: Nasal Mucosa; Nasal Swab  Result Value Ref Range Status   MRSA by PCR Next Gen NOT DETECTED NOT DETECTED Final    Comment: (NOTE) The GeneXpert MRSA Assay (FDA approved for NASAL specimens only), is one component of a comprehensive MRSA colonization surveillance program. It is not intended to diagnose MRSA infection nor to guide or monitor treatment for MRSA infections. Test performance is not FDA approved in patients less than 19 years old. Performed at [facility], [address], [place], [place] [address]     Time spent: 20 minutes  Signed: Tamela Stakes, MD

## 2024-01-25 NOTE — Progress Notes (Signed)
 Routine EEG complete. Results pending.

## 2024-01-25 NOTE — Progress Notes (Signed)
 PHARMACY - ANTICOAGULATION CONSULT NOTE  Pharmacy Consult for IV heparin  Indication: chest pain/ACS  Allergies  Allergen Reactions   Doxycycline  Dermatitis   Amlodipine Swelling and Other (See Comments)    Ankle swelling    Patient Measurements: Height: 5' 11 (180.3 cm) Weight: 107 kg (235 lb 14.3 oz) IBW/kg (Calculated) : 75.3 HEPARIN  DW (KG): 98  Vital Signs: Temp: 99.1 F (37.3 C) (11/14 1300) Temp Source: Bladder (11/14 0728) BP: 89/38 (11/14 1300) Pulse Rate: 76 (11/14 1528)  Labs: Recent Labs    01/07/24 2231 01/07/24 2236 01/07/24 2308 01/10/2024 0629 01/11/2024 0631 12/31/2023 0912 01/04/2024 1228  HGB  --  15.1  16.0   < > 11.6* 11.9* 11.9*  --   HCT  --  47.6  47.0   < > 34.0* 36.2* 35.0*  --   PLT  --  225  --   --  255  --   --   LABPROT 19.0*  --   --   --   --   --   --   INR 1.5*  --   --   --   --   --   --   CREATININE  --  1.68*  1.60*  --   --  2.66*  --   --   TROPONINIHS  --  97*  --   --   --   --  18,729*   < > = values in this interval not displayed.    Estimated Creatinine Clearance: 29.4 mL/min (A) (by C-G formula based on SCr of 2.66 mg/dL (H)).   Medical History: Past Medical History:  Diagnosis Date   Bronchiectasis (HCC)    Bronchiectasis (HCC)    CAP (community acquired pneumonia)    Carotid artery occlusion    Coronary atherosclerosis of unspecified type of vessel, native or graft    status post cardiac cath w percutaneous coronary intervention using XIENCE drug-eluting stent to mid left anterior descending 07/23/08; LHC 08/07/11: Mid LAD stent patent, moderate size diagonal branch jailed by stent with 70% ostial stenosis, unchanged from 2010 with excellent flow down the diagonal branch, mid circumflex 30%, proximal and mid RCA 20%, EF 60%.  Medical therapy continued.    GERD (gastroesophageal reflux disease)    Hepatitis C    Treated Harmony   Hyperlipemia    Hypertension    Hypopotassemia    Lichen planus    Other and  unspecified hyperlipidemia    Other specified cardiac dysrhythmias(427.89)    Paroxysmal supraventricular tachycardia    Personal history of other infectious and parasitic disease    Plantar fascial fibromatosis    Pneumonia    Syncope and collapse    Assessment: Tommy Santiago is a 78 y.o. year old male admitted on 01/07/2024 with concern for ACS. Troponin now >18K. No anticoagulation prior to admission. Pharmacy consulted to dose heparin .  Goal of Therapy:  Heparin  level 0.3-0.7 units/ml Monitor platelets by anticoagulation protocol: Yes   Plan:  Heparin  4000 units x 1 as bolus followed by heparin  infusion at 1200 units/hr 8h heparin  level  Daily heparin  level, CBC, and monitoring for bleeding F/u plans for anticoagulation and cards recs  Thank you for allowing pharmacy to participate in this patient's care.  Leonor GORMAN Bash, PharmD Emergency Medicine Clinical Pharmacist 01/01/2024,3:55 PM

## 2024-01-25 NOTE — Progress Notes (Signed)
  Echocardiogram 2D Echocardiogram has been performed.  Koleen KANDICE Popper, RDCS 12/31/2023, 8:28 AM

## 2024-01-25 NOTE — ED Notes (Signed)
 Bolus of 25 mcg fentanyl  given, and propofol  increase to 20 mcg per admitting ICU provider

## 2024-01-25 NOTE — Procedures (Signed)
 Arterial Catheter Insertion Procedure Note  Tommy Santiago  [id number]  Feb 28, 1945  Date:01/23/2024  Time:5:18 AM    Provider Performing: Sammi Gore    Procedure: Insertion of Arterial Line (63379) with US  guidance (23062)   Indication(s) Blood pressure monitoring and/or need for frequent ABGs  Consent Unable to obtain consent due to emergent nature of procedure.  Anesthesia None   Time Out Verified patient identification, verified procedure, site/side was marked, verified correct patient position, special equipment/implants available, medications/allergies/relevant history reviewed, required imaging and test results available.   Sterile Technique Maximal sterile technique including full sterile barrier drape, hand hygiene, sterile gown, sterile gloves, mask, hair covering, sterile ultrasound probe cover (if used).   Procedure Description Area of catheter insertion was cleaned with chlorhexidine  and draped in sterile fashion. With real-time ultrasound guidance an arterial catheter was placed into the right femoral artery.  Appropriate arterial tracings confirmed on monitor.     Complications/Tolerance None; patient tolerated the procedure well.   EBL Minimal   Specimen(s) None    Sammi Gore, PA - [facility] For pager details, please see AMION or use Epic chat  After 1900, please call ELINK for cross coverage needs 01/17/2024, 5:18 AM

## 2024-01-25 NOTE — Procedures (Signed)
 Intubation Procedure Note  Tommy Santiago  [id number]  [birth year]  Date:01/24/2024  Time:5:53 AM   Provider Performing:Athen Riel Layman    Procedure: Intubation (31500)  Indication(s) Respiratory Failure  Consent Risks of the procedure as well as the alternatives and risks of each were explained to the patient and/or caregiver.  Consent for the procedure was obtained and is signed in the bedside chart Pt was intubated in field with ett 7 which was collapsing and large cuff leak leading to dysynchrony and lack of tidal volumes. D/w wife to perform tube exchange with 8.0 tube.   Anesthesia Versed , Fentanyl , Rocuronium, Propofol , and see mar   Time Out Verified patient identification, verified procedure, site/side was marked, verified correct patient position, special equipment/implants available, medications/allergies/relevant history reviewed, required imaging and test results available.   Sterile Technique Usual hand hygeine, masks, and gloves were used   Procedure Description Patient positioned in bed supine.  Sedation given as noted above.  Patient was intubated with endotracheal tube using tube exchange over device.  Number of attempts was 1.  Colorimetric CO2 detector was consistent with tracheal placement.   Complications/Tolerance None; patient tolerated the procedure well. Chest X-ray is ordered to verify placement.   EBL Minimal   Specimen(s) None

## 2024-01-25 NOTE — Progress Notes (Signed)
 Patient extubated to comfort per written order with RT, RN and family at bedside.

## 2024-01-25 NOTE — ED Notes (Signed)
 Pt's BP going down after increasing sedation even with IV fluids running, Dr. Layman notified awaiting for more orders.

## 2024-01-25 NOTE — Progress Notes (Signed)
 Pt transported to CT and back to 47M ICU via ventilator with no apparent complications. Pt placed back on initial settings and is VSS at this time.

## 2024-01-25 NOTE — TOC CM/SW Note (Signed)
 Transition of Care [facility]) - Inpatient Brief Assessment   Patient Details  Name: Tommy Santiago MRN: [id number] Date of Birth: 1946/02/21  Transition of Care [facility]) CM/SW Contact:    [name], Harvest Muskrat, RN Phone Number: 01/16/2024, 9:18 AM   Clinical Narrative:  Patient presented to the ED after being found unresponsive in his car by bystanders. CPR was started and Shocks given by EMS on the scene. Currently intubated and sedated. On IV abx, Solu-Cortef.   No ICM needs or recommendations noted at this time.  Patient not Medically ready for discharge.  CM will continue to follow as patient progresses with care towards discharge.         Transition of Care Asessment:

## 2024-01-25 NOTE — Plan of Care (Signed)
  Interdisciplinary Goals of Care Family Meeting   Date carried out:: 01/04/2024  Location of the meeting: Bedside  Member's involved: Physician, Bedside Registered Nurse, and Family Member or next of kin - Wife, Son and daughter in social worker.  Durable Power of Insurance risk surveyor:     Discussion: We discussed goals of care for [employer] .  Updated the wife and the son and daughter-in-law in the room about patient's condition.  Explained about V-fib cardiac arrest and cardiogenic shock.  Likely etiology is myocardial infarction given the elevated troponin.  Also explained the CT scan results with rib fractures and likely hemothorax.  I explained the options going forward including putting in a chest tube considering CRRT and eventually if stabilized calling cardiology to perform a left heart cath.  At this stage the family said patient would have never wanted all this.  Son and the wife reiterated to the patient has always been DNR.  He never wanted to be intubated.  They do not want him to go through any more procedures and suffer through this.  They have decided to de-escalate care and go comfort care.  Code status: Full DNR  Disposition: In-patient comfort care   Time spent for the meeting: 20  [facility] 01/20/2024, 4:35 PM

## 2024-01-25 NOTE — Progress Notes (Signed)
 Upon arrival to unit pt noted to be abdominally breathing, with audible air escape from ET tube. Tube was noted to be bent distally near junction of tube and ventilator and again in the mouth bent towards left cheek. When corrected less air escape noted.   Increased pressor support  Sammi Gore, PA verbally stated ok to increase levophed pass 10 mcg until central line placed. PA placed central and arterial line. Pressor support added.   Dr. Layman exchanged ET tube. Paralytic and versed  for sedation used.   BP labile. Titration of pressors as ordered. Wife present at bedside. Report given.

## 2024-01-25 NOTE — Progress Notes (Signed)
 eLink Physician-Brief Progress Note Patient Name: Tommy Santiago DOB: Jan 15, 1946 MRN: [id number]   Date of Service  01/19/2024  HPI/Events of Note  Patient admitted to the ICU from the ED for coma and acute respiratory failure following out of hospital V-Fib cardiac arrest which occurred in the context of intermittent chest pain over the course of the past two days.  eICU Interventions  New Patient Evaluation.        Coyt Govoni U Janera Peugh 01/06/2024, 4:55 AM

## 2024-01-25 NOTE — Procedures (Signed)
 Central Venous Catheter Insertion Procedure Note  Tommy Santiago  [id number]  21-Jan-1946  Date:01/16/2024  Time:5:18 AM   Provider Performing:Temari Schooler Meade   Procedure: Insertion of Non-tunneled Central Venous Catheter(36556) with US  guidance (23062)   Indication(s) Medication administration and Difficult access  Consent Unable to obtain consent due to emergent nature of procedure.  Anesthesia Topical only with 1% lidocaine    Timeout Verified patient identification, verified procedure, site/side was marked, verified correct patient position, special equipment/implants available, medications/allergies/relevant history reviewed, required imaging and test results available.  Sterile Technique Maximal sterile technique including full sterile barrier drape, hand hygiene, sterile gown, sterile gloves, mask, hair covering, sterile ultrasound probe cover (if used).  Procedure Description Area of catheter insertion was cleaned with chlorhexidine  and draped in sterile fashion.  With real-time ultrasound guidance a central venous catheter was placed into the right femoral vein. Nonpulsatile blood flow and easy flushing noted in all ports.  The catheter was sutured in place and sterile dressing applied.  Complications/Tolerance None; patient tolerated the procedure well. Chest X-ray is ordered to verify placement for internal jugular or subclavian cannulation.   Chest x-ray is not ordered for femoral cannulation.  EBL Minimal  Specimen(s) None   Sammi Meade, PA - [facility] For pager details, please see AMION or use Epic chat  After 1900, please call ELINK for cross coverage needs 01/19/2024, 5:18 AM

## 2024-01-25 NOTE — Progress Notes (Signed)
 NAME:  Tommy Santiago, MRN:  [id number], DOB:  Jul 01, 1945, LOS: 0 ADMISSION DATE:  01/07/2024, CONSULTATION DATE:  01/07/24 REFERRING MD:  EDP, CHIEF COMPLAINT:  post arrest   History of Present Illness:  78 yo male found unresponsive in his car by bystanders. EMS notified and pt was found in vfib, cpr started and 3 shocks given. Unknown downtime as he was found unresponsive but from time of ems arrival at 2147 ROSC achieved at 2210.    All history is obtained from chart as pt is intubated and unresponsive.   All labs and studies are pending. CCM called to admit as pt intubated.   Pertinent  Medical History  Bronchiectasis Cad s/p pci 2010 Gerd H/o hep c s/p treatment H/o HCC Hyperlipidemia Htn H/o syncope and collapse Svt HFpEF (grade 2)   Significant Hospital Events: Including procedures, antibiotic start and stop dates in addition to other pertinent events   Admitted to ICU after cardiac arrest 01/07/24 11/14 CT head/Chest/Abdomen Pending   Interim History / Subjective:  Intubated, minimal sedation No cough/gag reflex   No purposeful movements   Wife at beside-tearful   Objective    Blood pressure (!) 68/42, pulse (!) 115, temperature 98.7 F (37.1 C), temperature source Axillary, resp. rate (!) 35, height 5' 11 (1.803 m), weight 107 kg, SpO2 100%.    Vent Mode: PRVC FiO2 (%):  [100 %] 100 % Set Rate:  [20 bmp] 20 bmp Vt Set:  [379 mL] 620 mL PEEP:  [8 cmH20-10 cmH20] 10 cmH20 Plateau Pressure:  [17 cmH20-24 cmH20] 24 cmH20   Intake/Output Summary (Last 24 hours) at 01/11/2024 9281 Last data filed at 01/04/2024 9781 Gross per 24 hour  Intake 1500 ml  Output --  Net 1500 ml   Filed Weights   01/07/24 2244  Weight: 107 kg    Examination: General: acute on chronic older adult male, lying in icu bed on vent  HEENT: Normocephalic, PERRLA intact, ETT, OG  Pink MM CV: s1,s2, RRR, no MRG, No JVD  pulm: diminished, no distress on vent  Abs: bs active,  soft, obese  Extremities: no edema, no deformity, no purposeful movement to pain Skin: no rash  Neuro: Rass -4,does not responds to painful stimuli, cough gag reflex absent GU: foley intact   Resolved problem list   Assessment and Plan  OOHCA, vfib arrest s/p rosc Leukocytosis Lactic acidosis Hypokalemia Metabolic acidosis HFpEF H/o HCC, received tx [facility] Aspiration Pneumonia  Undifferentiated Shock- Septic vs +/- Cardiogenic, still need imaging to rule out obstructive shock  Chest x-ray showing- increase dense lower lobe consolidation/atelectasis, increased atelectasis/consolidation  P:  Continue ventilator support and lung protective strategies  Continue LTVV  Wean PEEP and Fio2 requirements to sat goal of >92%  HOB > 30 degrees Plat < 30  Aim for Driving pressures < 15  Intermittent Chest X-ray and ABGS Obtain and follow cultures-blood and tracheal aspirate VAP and PAD protocols in place  Off sedation, continue frequent neuro checks  EEG ordered, obtain  Obtain STAT CT of head/chest/abdomen- f/u with results  Continue to maintain normothermia  Received zosyn and vanc for empiric coverage, will place on rocephin  for aspiration coverage  Follow up with MRSA Recheck lactic acid, trend On EPI, levo, vaso- MAP goal >65  -wean EPI first  Obtain ECHO Discussed with wife at beside- Code Status Changed to DNR, continue current treatment. Family [place] of state. Will more than likely need MRI within 72hrs if  no neurological improvement. F/u with CT scan of head Concern that patient has had a significant anoxic brain injury due to absent cough/gag reflex  Add stress dose steroids  Add bicarb gtt   AKI Hypocalcemia  P: Continue to trend renal function daily  Continue to monitor and optimize electrolytes daily Continue to monitor urine output Continue strict I/Os Continue Adequate renal perfusion  Avoid nephrotoxic agents  2 grams of calcium  ordered    Hyperglycemia  A1c pending  CBG > 200 P: Add moderate SSI CBG q 4   HTN CAD HLD P: Hold off on antihypertensives Follow up with ECHO  Hold off AC for DVT ppx for now until CT of head, chest/abdomen/pelvis    Labs   CBC: Recent Labs  Lab 01/07/24 2236 01/07/24 2308 01/07/24 2311 01/20/2024 0629 01/24/2024 0631  WBC 15.5*  --   --   --  30.6*  HGB 15.1  16.0 14.3 15.0 11.6* 11.9*  HCT 47.6  47.0 42.0 44.0 34.0* 36.2*  MCV 112.3*  --   --   --  108.7*  PLT 225  --   --   --  255    Basic Metabolic Panel: Recent Labs  Lab 01/07/24 2236 01/07/24 2308 01/07/24 2311 01/22/2024 0629  NA 136  136 134* 136 138  K 3.3*  3.4* 4.4 3.8 4.2  CL 100  103  --   --   --   CO2 16*  --   --   --   GLUCOSE 231*  296*  --   --   --   BUN 21  26*  --   --   --   CREATININE 1.68*  1.60*  --   --   --   CALCIUM  8.7*  --   --   --    GFR: Estimated Creatinine Clearance: 48.9 mL/min (A) (by C-G formula based on SCr of 1.6 mg/dL (H)). Recent Labs  Lab 01/07/24 2236 01/07/24 2237 01/01/2024 0040 01/16/2024 0631  WBC 15.5*  --   --  30.6*  LATICACIDVEN  --  10.9* 8.9*  --     Liver Function Tests: Recent Labs  Lab 01/07/24 2236  AST 46*  ALT 39  ALKPHOS 72  BILITOT 0.8  PROT 6.9  ALBUMIN 3.8   Recent Labs  Lab 01/07/24 2231  LIPASE 29   No results for input(s): AMMONIA in the last 168 hours.  ABG    Component Value Date/Time   PHART 7.090 (LL) 01/22/2024 0629   PCO2ART 45.8 01/05/2024 0629   PO2ART 90 01/03/2024 0629   HCO3 13.9 (L) 01/23/2024 0629   TCO2 15 (L) 01/22/2024 0629   ACIDBASEDEF 15.0 (H) 12/31/2023 0629   O2SAT 93 01/02/2024 0629     Coagulation Profile: Recent Labs  Lab 01/07/24 2231  INR 1.5*    Cardiac Enzymes: No results for input(s): CKTOTAL, CKMB, CKMBINDEX, TROPONINI in the last 168 hours.  HbA1C: No results found for: HGBA1C  CBG: No results for input(s): GLUCAP in the last 168 hours.  Review of Systems:    Unobtainable 2/2 intubated status  Past Medical History:  He,  has a past medical history of Bronchiectasis (HCC), Bronchiectasis (HCC), CAP (community acquired pneumonia), Carotid artery occlusion, Coronary atherosclerosis of unspecified type of vessel, native or graft, GERD (gastroesophageal reflux disease), Hepatitis C, Hyperlipemia, Hypertension, Hypopotassemia, Lichen planus, Other and unspecified hyperlipidemia, Other specified cardiac dysrhythmias(427.89), Paroxysmal supraventricular tachycardia, Personal history of other infectious and parasitic disease, Plantar fascial  fibromatosis, Pneumonia, and Syncope and collapse.   Surgical History:   Past Surgical History:  Procedure Laterality Date   CATARACT EXTRACTION     CHOLECYSTECTOMY     COLONOSCOPY     colonscopy     status post   CORONARY ANGIOPLASTY WITH STENT PLACEMENT     ESOPHAGOGASTRODUODENOSCOPY     status post   INSERTION OF MESH N/A 01/30/2017   Procedure: INSERTION OF MESH;  Surgeon: Sebastian Moles, MD;  Location: [facility];  Service: General;  Laterality: N/A;   KNEE ARTHROSCOPY     LEFT HEART CATHETERIZATION WITH CORONARY ANGIOGRAM  08/07/2011   Procedure: LEFT HEART CATHETERIZATION WITH CORONARY ANGIOGRAM;  Surgeon: Lonni JONETTA Cash, MD;  Location: [facility];  Service: Cardiovascular;;   UMBILICAL HERNIA REPAIR N/A 01/30/2017   Procedure: REPAIR OF UMBILICAL HERNIA;  Surgeon: Sebastian Moles, MD;  Location: [facility];  Service: General;  Laterality: N/A;     Social History:   reports that he quit smoking about 36 years ago. His smoking use included cigarettes. He started smoking about 61 years ago. He has a 50 pack-year smoking history. He has never been exposed to tobacco smoke. He has never used smokeless tobacco. He reports that he does not currently use alcohol after a past usage of about 12.0 standard drinks of alcohol per week. He reports that he does not use drugs.    Family History:  His family history includes Coronary artery disease in his brother and another family member; Heart disease in his father; Rectal cancer in his brother. There is no history of Sleep apnea.   Allergies Allergies  Allergen Reactions   Doxycycline  Dermatitis   Amlodipine Swelling and Other (See Comments)    Ankle swelling     Home Medications  Prior to Admission medications   Medication Sig Start Date End Date Taking? Authorizing Provider  albuterol  (PROVENTIL ) (2.5 MG/3ML) 0.083% nebulizer solution Take 3 mLs (2.5 mg total) by nebulization every 6 (six) hours as needed for wheezing or shortness of breath. 08/12/22   Sood, Vineet, MD  albuterol  (PROVENTIL ) (2.5 MG/3ML) 0.083% nebulizer solution Take 3 mL (2.5 mg total) by nebulization every 6 (six) hours as needed for wheezing or shortness of breath. 07/22/23     amoxicillin -clavulanate (AUGMENTIN ) 875-125 MG tablet Take 1 tablet by mouth 2 (two) times a day for 7 days. 07/22/23     aspirin  EC 81 MG tablet Take 1 tablet (81 mg total) by mouth daily. 06/24/17   Parthenia Olivia HERO, PA-C  atorvastatin  (LIPITOR) 20 MG tablet Take 1 tablet (20 mg total) by mouth daily. 11/09/23     B Complex-C (SUPER B COMPLEX PO) Take 1 tablet by mouth daily.    [provider]  Calcium  Carbonate-Vitamin D  (CALCIUM -D PO) Take 1-1.5 tablets by mouth See admin instructions. Take 1 tablet in the morning and 1.5 tablets at night    [provider]  Cholecalciferol  (VITAMIN D ) 50 MCG (2000 UT) tablet Take 2,000 Units by mouth daily.    [provider]  dextromethorphan-guaiFENesin (ROBITUSSIN-DM) 10-100 MG/5ML liquid Take 10 mLs by mouth every 6 (six) hours as needed for cough.    [provider]  diphenhydrAMINE (BENADRYL) 25 MG tablet Take 25 mg by mouth daily as needed for allergies.    [provider]  Docusate Sodium  (DSS) 100 MG CAPS Take 1 capsule by mouth 2 (two) times daily. 04/11/21   [provider]  doxycycline  (VIBRA -TABS) 100  MG tablet Take 1 tablet (100 mg total) by mouth 2 (two) times daily. 08/12/22   Sood, Vineet, MD  famotidine  (PEPCID ) 20 MG tablet Take 1 tablet (20 mg total) by mouth daily. 01/26/23     Fiber POWD Take 1 Scoop by mouth daily as needed (constipation).    [provider]  fluticasone  (CUTIVATE ) 0.005 % ointment Apply 1 Application topically 2 (two) times daily as needed for flare 01/28/23     hydrochlorothiazide  (HYDRODIURIL ) 25 MG tablet Take 0.5 tablets (12.5 mg total) by mouth daily. 03/19/23   Lelon Hamilton T, PA-C  ibuprofen  (ADVIL ) 200 MG tablet Take by mouth as needed for headache or moderate pain. 04/03/21   [provider]  magnesium  oxide (MAG-OX) 400 MG tablet Take by mouth.    [provider]  metoprolol  tartrate (LOPRESSOR ) 50 MG tablet Take 1 tablet (50 mg total) by mouth 2 (two) times daily. 03/18/23 03/14/24  Lelon Hamilton T, PA-C  MISC NATURAL PRODUCTS PO Take 7 mg by mouth daily. CBD Gummies    [provider]  nitroGLYCERIN  (NITROSTAT ) 0.4 MG SL tablet Place 1 tablet (0.4 mg total) under the tongue every 5 (five) minutes as needed for chest pain. 07/23/22   Parthenia Olivia HERO, PA-C  olmesartan  (BENICAR ) 40 MG tablet Take 1 tablet (40 mg total) by mouth daily. 08/07/23   Verlin Lonni BIRCH, MD  polyethylene glycol (MIRALAX  / GLYCOLAX ) 17 g packet Take by mouth as needed for moderate constipation. 04/09/21   [provider]  PSYLLIUM PO Take by mouth.    [provider]  Respiratory Therapy Supplies (FLUTTER) DEVI Use as directed 06/14/19   Sood, Vineet, MD  Simethicone  125 MG CAPS Take 1 capsule by mouth as needed.    [provider]  sodium chloride  HYPERTONIC 3 % nebulizer solution Take 4 mLs by nebulization as needed for chest congestion. 08/12/22   Sood, Vineet, MD  sodium chloride  HYPERTONIC 3 % nebulizer solution Take 4 mL by nebulization 2 (two) times a day as needed for  shortness of breath, wheezing or cough. 07/22/23     tetrahydrozoline-zinc (VISINE-AC) 0.05-0.25 % ophthalmic solution Apply to eye as needed (dry eyes). 04/03/21   [provider]  vitamin E 180 MG (400 UNITS) capsule Take 400 Units by mouth daily.    [provider]  enoxaparin  (LOVENOX ) 40 MG/0.4ML injection Inject 0.4 mLs (40 mg total) into the skin daily for 14 days. Patient not taking: Reported on 03/18/2023 04/09/21 04/09/21       Critical care time: 60 mins    Christian Meridee Branum AGACNP-BC   [facility] 01/17/2024, 9:19 AM  Please see [email or web address] for pager details.  From 7A-7P if no response, please call [phone]. After hours, please call ELink [phone].

## 2024-01-25 NOTE — Procedures (Signed)
 Patient Name: Tommy Santiago  MRN: [id number]  Epilepsy Attending: Arlin MALVA Krebs  Referring Physician/Provider: Claudene Fonda BROCKS, NP  Date: 01/13/2024 Duration: 22.36 mins  Patient history: 78 year old male status post cardiac arrest.  EEG to evaluate for seizure.  Level of alertness: Awake, asleep  AEDs during EEG study: None  Technical aspects: This EEG study was done with scalp electrodes positioned according to the 10-20 International system of electrode placement. Electrical activity was reviewed with band pass filter of 1-70Hz , sensitivity of 7 uV/mm, display speed of 4mm/sec with a 60Hz  notched filter applied as appropriate. EEG data were recorded continuously and digitally stored.  Video monitoring was available and reviewed as appropriate.  Description: During awake state, EEG showed continuous generalized predominantly 6 to 8 Hz theta-alpha activity admixed with intermittent generalized 2 to 3 Hz delta slowing.  Sleep was characterized by sleep spindles (12 to 14 Hz), maximal frontocentral region.  Hyperventilation and photic stimulation were not performed.     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 No seizures or epileptiform discharges were seen throughout the recording.  Phoua Hoadley O Kimm Sider

## 2024-01-25 DEATH — deceased

## 2024-02-01 ENCOUNTER — Encounter (HOSPITAL_COMMUNITY)

## 2024-02-01 ENCOUNTER — Ambulatory Visit

## 2024-02-15 ENCOUNTER — Ambulatory Visit

## 2024-02-15 ENCOUNTER — Encounter (HOSPITAL_COMMUNITY)
